# Patient Record
Sex: Male | Born: 1965 | Race: White | Hispanic: No | Marital: Single | Smoking: Never smoker
Health system: Southern US, Community
[De-identification: ages and names within clinical notes are randomized; demographics above are authoritative.]

## PROBLEM LIST (undated history)

## (undated) DIAGNOSIS — G4730 Sleep apnea, unspecified: Secondary | ICD-10-CM

## (undated) DIAGNOSIS — F419 Anxiety disorder, unspecified: Secondary | ICD-10-CM

## (undated) DIAGNOSIS — K219 Gastro-esophageal reflux disease without esophagitis: Secondary | ICD-10-CM

## (undated) DIAGNOSIS — M1990 Unspecified osteoarthritis, unspecified site: Secondary | ICD-10-CM

## (undated) DIAGNOSIS — C801 Malignant (primary) neoplasm, unspecified: Secondary | ICD-10-CM

## (undated) DIAGNOSIS — E119 Type 2 diabetes mellitus without complications: Secondary | ICD-10-CM

## (undated) DIAGNOSIS — R000 Tachycardia, unspecified: Secondary | ICD-10-CM

## (undated) HISTORY — DX: Anxiety disorder, unspecified: F41.9

## (undated) HISTORY — PX: HERNIA REPAIR: SHX51

## (undated) HISTORY — PX: APPENDECTOMY: SHX54

## (undated) HISTORY — PX: TONSILLECTOMY: SUR1361

---

## 2007-12-14 ENCOUNTER — Ambulatory Visit: Payer: Self-pay | Admitting: Vascular Surgery

## 2009-10-15 ENCOUNTER — Inpatient Hospital Stay: Payer: Self-pay | Admitting: Internal Medicine

## 2010-06-29 ENCOUNTER — Emergency Department: Payer: Self-pay | Admitting: Unknown Physician Specialty

## 2010-07-12 ENCOUNTER — Emergency Department: Payer: Self-pay | Admitting: Unknown Physician Specialty

## 2012-08-29 ENCOUNTER — Emergency Department: Payer: Self-pay | Admitting: Emergency Medicine

## 2015-09-21 DIAGNOSIS — F419 Anxiety disorder, unspecified: Secondary | ICD-10-CM | POA: Insufficient documentation

## 2015-09-22 ENCOUNTER — Other Ambulatory Visit: Payer: Self-pay | Admitting: Family Medicine

## 2015-09-22 ENCOUNTER — Ambulatory Visit: Payer: Self-pay | Admitting: Family Medicine

## 2015-09-22 ENCOUNTER — Telehealth: Payer: Self-pay | Admitting: Family Medicine

## 2015-09-22 MED ORDER — CLONAZEPAM 1 MG PO TABS: 1.0000 mg | ORAL_TABLET | Freq: Every day | ORAL | Status: DC | PRN

## 2015-09-22 NOTE — Telephone Encounter (Signed)
Pt called in and is unable to make appt this morning but rescheduled for October 12th and would like to know if he could have clonazepam called in to [facility](he only has 1 pill left)

## 2015-10-07 ENCOUNTER — Ambulatory Visit (INDEPENDENT_AMBULATORY_CARE_PROVIDER_SITE_OTHER): Payer: Self-pay | Admitting: Family Medicine

## 2015-10-07 ENCOUNTER — Encounter: Payer: Self-pay | Admitting: Family Medicine

## 2015-10-07 VITALS — BP 118/85 | HR 105 | Temp 97.7°F | Ht 69.3 in | Wt 228.0 lb

## 2015-10-07 DIAGNOSIS — F419 Anxiety disorder, unspecified: Secondary | ICD-10-CM

## 2015-10-07 MED ORDER — CLONAZEPAM 1 MG PO TABS: 1.0000 mg | ORAL_TABLET | Freq: Every day | ORAL | Status: DC | PRN

## 2015-10-07 NOTE — Progress Notes (Signed)
   BP 118/85 mmHg  Pulse 105  Temp(Src) 97.7 F (36.5 C)  Ht 5' 9.3" (1.76 m)  Wt 228 lb (103.42 kg)  BMI 33.39 kg/m2  SpO2 97%   Subjective:    [name], male    DOB: 04-Aug-1966, 49 y.o.   MRN: [id number]  HPI: Johnny Rios is a 49 y.o. male  Chief Complaint  Patient presents with  . Anxiety    medication refilled   patient doing actually very well has started a new job with [employer]. Celeste stressful with less anxiety taking clonazepam each morning. 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  Relevant past medical, surgical, family and social history reviewed and updated as indicated. Interim medical history since our last visit reviewed. Allergies and medications reviewed and updated.  Review of Systems  Constitutional: Negative.   Respiratory: Negative.   Cardiovascular: Negative.     Per HPI unless specifically indicated above     Objective:    BP 118/85 mmHg  Pulse 105  Temp(Src) 97.7 F (36.5 C)  Ht 5' 9.3" (1.76 m)  Wt 228 lb (103.42 kg)  BMI 33.39 kg/m2  SpO2 97%  Wt Readings from Last 3 Encounters:  10/07/15 228 lb (103.42 kg)  04/01/15 240 lb (108.863 kg)    Physical Exam  Constitutional: He is oriented to person, place, and time. He appears well-developed and well-nourished. No distress.  HENT:  Head: Normocephalic and atraumatic.  Right Ear: Hearing normal.  Left Ear: Hearing normal.  Nose: Nose normal.  Eyes: Conjunctivae and lids are normal. Right eye exhibits no discharge. Left eye exhibits no discharge. No scleral icterus.  Pulmonary/Chest: Effort normal. No respiratory distress.  Musculoskeletal: Normal range of motion.  Neurological: He is alert and oriented to person, place, and time.  Skin:  Skin is intact. No rash noted.  Psychiatric: He has a normal mood and affect. His speech is normal and behavior is normal. Judgment and thought content normal. Cognition and memory are normal.    No results found for this or any previous visit.    Assessment & Plan:   Problem List Items Addressed This Visit      Other   Anxiety - Primary    Patient doing okay especially with job change continuing to need medications will give refills for 6 months.          Follow up plan: Return in about 6 months (around 04/06/2016) for Physical Exam.

## 2015-10-07 NOTE — Assessment & Plan Note (Signed)
Patient doing okay especially with job change continuing to need medications will give refills for 6 months.

## 2015-12-02 ENCOUNTER — Encounter: Payer: Self-pay | Admitting: Unknown Physician Specialty

## 2015-12-02 ENCOUNTER — Ambulatory Visit (INDEPENDENT_AMBULATORY_CARE_PROVIDER_SITE_OTHER): Payer: Self-pay | Admitting: Unknown Physician Specialty

## 2015-12-02 VITALS — BP 123/91 | HR 118 | Temp 98.0°F | Ht 70.1 in | Wt 241.6 lb

## 2015-12-02 DIAGNOSIS — Z9989 Dependence on other enabling machines and devices: Secondary | ICD-10-CM

## 2015-12-02 DIAGNOSIS — E669 Obesity, unspecified: Secondary | ICD-10-CM

## 2015-12-02 DIAGNOSIS — R000 Tachycardia, unspecified: Secondary | ICD-10-CM

## 2015-12-02 DIAGNOSIS — G4733 Obstructive sleep apnea (adult) (pediatric): Secondary | ICD-10-CM | POA: Insufficient documentation

## 2015-12-02 DIAGNOSIS — G4730 Sleep apnea, unspecified: Secondary | ICD-10-CM

## 2015-12-02 LAB — CBC
Hematocrit: 42.4 % (ref 37.5–51.0)
Hemoglobin: 14.9 g/dL (ref 12.6–17.7)
MCH: 28.5 pg (ref 26.6–33.0)
MCHC: 35.1 g/dL (ref 31.5–35.7)
MCV: 81 fL (ref 79–97)
Platelets: 318 10*3/uL (ref 150–379)
RBC: 5.22 x10E6/uL (ref 4.14–5.80)
RDW: 13.2 % (ref 12.3–15.4)
WBC: 9.3 10*3/uL (ref 3.4–10.8)

## 2015-12-02 LAB — BAYER DCA HB A1C WAIVED: HB A1C (BAYER DCA - WAIVED): 5.5 % (ref <7.0)

## 2015-12-02 LAB — GLUCOSE HEMOCUE WAIVED: GLU HEMOCUE WAIVED: 136 mg/dL — AB (ref 65–99)

## 2015-12-02 MED ORDER — METOPROLOL SUCCINATE ER 25 MG PO TB24: 25.0000 mg | ORAL_TABLET | Freq: Every day | ORAL | Status: DC

## 2015-12-02 NOTE — Progress Notes (Signed)
BP 123/91 mmHg  Pulse 118  Temp(Src) 98 F (36.7 C)  Ht 5' 10.1" (1.781 m)  Wt 241 lb 9.6 oz (109.589 kg)  BMI 34.55 kg/m2  SpO2 95%   Subjective:    [name], male    DOB: 1966-05-07, 49 y.o.   MRN: [id number]  HPI: Johnny Rios is a 49 y.o. male  Chief Complaint  Patient presents with  . Tachycardia    pt states his pulse rate and BP has been running high since Monday. Patient also states that he has been dizzy and light headed since Monday.   He is checking his BP which has not been high but pulse is as high as 130.  No additional anxiety or stress though is being treated with clonazepam by Dr. Jeananne Rama.  He is on no BP meds..  No chest pain or SOB.  He is dizzy and light headed that comes and goes.  Drinks occasional alcohol. Reports being thirsty "a lot" and keeps something to drink "all the time."  He does report urinating all the time. Denies palpitations  He describes the dizzyness as being light-headed when getting up or down.     He does admit to snoring loudly at night.  Does not wake up rested.  Sleepy all the time.  Falls aslep if still at all.    Relevant past medical, surgical, family and social history reviewed and updated as indicated. Interim medical history since our last visit reviewed. Allergies and medications reviewed and updated.  Review of Systems  Per HPI unless specifically indicated above     Objective:    BP 123/91 mmHg  Pulse 118  Temp(Src) 98 F (36.7 C)  Ht 5' 10.1" (1.781 m)  Wt 241 lb 9.6 oz (109.589 kg)  BMI 34.55 kg/m2  SpO2 95%  Wt Readings from Last 3 Encounters:  12/02/15 241 lb 9.6 oz (109.589 kg)  10/07/15 228 lb (103.42 kg)  04/01/15 240 lb (108.863 kg)    Physical Exam  Constitutional: He is oriented to person, place, and time. He appears well-developed and well-nourished. No distress.  HENT:  Head: Normocephalic and atraumatic.  Eyes: Conjunctivae and lids are normal. Right eye exhibits no  discharge. Left eye exhibits no discharge. No scleral icterus.  Cardiovascular: Normal rate, regular rhythm, normal heart sounds and intact distal pulses.  Exam reveals no gallop and no friction rub.   No murmur heard. Pulmonary/Chest: Effort normal and breath sounds normal. No respiratory distress.  Abdominal: Soft. Normal appearance and bowel sounds are normal. He exhibits no distension. There is no splenomegaly or hepatomegaly. There is no tenderness. There is no rebound and no guarding.  Musculoskeletal: Normal range of motion.  Neurological: He is alert and oriented to person, place, and time.  Skin: Skin is warm, dry and intact. No rash noted. No pallor.  Psychiatric: He has a normal mood and affect. His behavior is normal. Judgment and thought content normal.  Nursing note and vitals reviewed.  EKG showing sinus tach without acute changes.    Blood sugar is 136 with a Hgb A1C of 5.5 H/H is normal  No results found for this or any previous visit.    Assessment & Plan:   Problem List Items Addressed This Visit      Unprioritized   Obesity   Tachycardia - Primary    Will rx low dose beta blocker.        Relevant Orders   EKG 12-Lead (Completed)  Comprehensive metabolic panel   Glucose Hemocue Waived   CBC   TSH   Bayer DCA Hb A1c Waived   Sleep apnea    Probably contributing to problem.  Will order sleep study      Relevant Orders   Ambulatory referral to Sleep Studies       Follow up plan: Return in about 4 weeks (around 12/30/2015) for With me or Dr. Jeananne Rama.

## 2015-12-02 NOTE — Assessment & Plan Note (Signed)
Will rx low dose beta blocker.

## 2015-12-02 NOTE — Assessment & Plan Note (Signed)
Probably contributing to problem.  Will order sleep study

## 2015-12-03 LAB — COMPREHENSIVE METABOLIC PANEL
ALT: 27 IU/L (ref 0–44)
AST: 19 IU/L (ref 0–40)
Albumin/Globulin Ratio: 1.4 (ref 1.1–2.5)
Albumin: 4 g/dL (ref 3.5–5.5)
Alkaline Phosphatase: 93 IU/L (ref 39–117)
BILIRUBIN TOTAL: 0.3 mg/dL (ref 0.0–1.2)
BUN / CREAT RATIO: 10 (ref 9–20)
BUN: 11 mg/dL (ref 6–24)
CO2: 28 mmol/L (ref 18–29)
CREATININE: 1.05 mg/dL (ref 0.76–1.27)
Calcium: 9.5 mg/dL (ref 8.7–10.2)
Chloride: 99 mmol/L (ref 97–106)
GFR calc Af Amer: 97 mL/min/{1.73_m2} (ref >59)
GFR calc non Af Amer: 84 mL/min/{1.73_m2} (ref >59)
GLOBULIN, TOTAL: 2.8 g/dL (ref 1.5–4.5)
Glucose: 129 mg/dL — ABNORMAL HIGH (ref 65–99)
Potassium: 4.6 mmol/L (ref 3.5–5.2)
Sodium: 139 mmol/L (ref 136–144)
Total Protein: 6.8 g/dL (ref 6.0–8.5)

## 2015-12-03 LAB — TSH: TSH: 1.88 u[IU]/mL (ref 0.450–4.500)

## 2015-12-31 ENCOUNTER — Ambulatory Visit: Payer: Self-pay | Admitting: Family Medicine

## 2016-01-08 ENCOUNTER — Telehealth: Payer: Self-pay | Admitting: Family Medicine

## 2016-01-08 NOTE — Telephone Encounter (Signed)
They are all normal.  He was supposed to be seen for a 4 week f/u

## 2016-01-08 NOTE — Telephone Encounter (Signed)
Pt called stated he would like the results of his most recent labs. Please call pt ASAP. Thanks.

## 2016-01-08 NOTE — Telephone Encounter (Signed)
Called patient to try to schedule follow-up visit but he did not want to schedule.

## 2016-01-08 NOTE — Telephone Encounter (Signed)
Called and spoke to patient. He was seen on 12/02/15 by Malachy Mood and he states he has not heard anything about his labs. Is there anything we need to tell him about his labs?

## 2016-04-13 ENCOUNTER — Encounter: Payer: Self-pay | Admitting: Family Medicine

## 2016-04-13 ENCOUNTER — Ambulatory Visit (INDEPENDENT_AMBULATORY_CARE_PROVIDER_SITE_OTHER): Payer: Self-pay | Admitting: Family Medicine

## 2016-04-13 VITALS — BP 125/86 | HR 93 | Temp 98.0°F | Ht 69.0 in | Wt 250.0 lb

## 2016-04-13 DIAGNOSIS — F419 Anxiety disorder, unspecified: Secondary | ICD-10-CM

## 2016-04-13 DIAGNOSIS — E669 Obesity, unspecified: Secondary | ICD-10-CM

## 2016-04-13 MED ORDER — CLONAZEPAM 1 MG PO TABS
1.0000 mg | ORAL_TABLET | Freq: Every day | ORAL | Status: DC | PRN
Start: 2016-04-13 — End: 2016-10-12

## 2016-04-13 NOTE — Progress Notes (Signed)
BP 125/86 mmHg  Pulse 93  Temp(Src) 98 F (36.7 C)  Ht 5\' 9"  (1.753 m)  Wt 250 lb (113.399 kg)  BMI 36.90 kg/m2  SpO2 96%   Subjective:    [name], male    DOB: 05/15/1966, 50 y.o.   MRN: [id number]  HPI: Johnny Rios is a 50 y.o. male  Chief Complaint  Patient presents with  . Anxiety    med refill  Patient all in all doing well takes clonazepam 5 tablets last month or so aches for anxiety and work stress. Discuss with patient massive weight gain patient got a girlfriend is a good cook  Relevant past medical, surgical, family and social history reviewed and updated as indicated. Interim medical history since our last visit reviewed. Allergies and medications reviewed and updated.  Review of Systems  Constitutional: Negative.   Respiratory: Negative.     Per HPI unless specifically indicated above     Objective:    BP 125/86 mmHg  Pulse 93  Temp(Src) 98 F (36.7 C)  Ht 5\' 9"  (1.753 m)  Wt 250 lb (113.399 kg)  BMI 36.90 kg/m2  SpO2 96%  Wt Readings from Last 3 Encounters:  04/13/16 250 lb (113.399 kg)  12/02/15 241 lb 9.6 oz (109.589 kg)  10/07/15 228 lb (103.42 kg)    Physical Exam  Constitutional: He is oriented to person, place, and time. He appears well-developed and well-nourished. No distress.  HENT:  Head: Normocephalic and atraumatic.  Right Ear: Hearing normal.  Left Ear: Hearing normal.  Nose: Nose normal.  Eyes: Conjunctivae and lids are normal. Right eye exhibits no discharge. Left eye exhibits no discharge. No scleral icterus.  Cardiovascular: Normal rate, regular rhythm and normal heart sounds.   Pulmonary/Chest: Effort normal and breath sounds normal. No respiratory distress.  Musculoskeletal: Normal range of motion.  Neurological: He is alert and oriented to person, place, and time.  Skin: Skin is intact. No rash noted.  Psychiatric: He has a normal mood and affect. His speech is normal and behavior is normal.  Judgment and thought content normal. Cognition and memory are normal.    Results for orders placed or performed in visit on 12/02/15  Comprehensive metabolic panel  Result Value Ref Range   Glucose 129 (H) 65 - 99 mg/dL   BUN 11 6 - 24 mg/dL   Creatinine, Ser 1.05 0.76 - 1.27 mg/dL   GFR calc non Af Amer 84 >59 mL/min/1.73   GFR calc Af Amer 97 >59 mL/min/1.73   BUN/Creatinine Ratio 10 9 - 20   Sodium 139 136 - 144 mmol/L   Potassium 4.6 3.5 - 5.2 mmol/L   Chloride 99 97 - 106 mmol/L   CO2 28 18 - 29 mmol/L   Calcium 9.5 8.7 - 10.2 mg/dL   Total Protein 6.8 6.0 - 8.5 g/dL   Albumin 4.0 3.5 - 5.5 g/dL   Globulin, Total 2.8 1.5 - 4.5 g/dL   Albumin/Globulin Ratio 1.4 1.1 - 2.5   Bilirubin Total 0.3 0.0 - 1.2 mg/dL   Alkaline Phosphatase 93 39 - 117 IU/L   AST 19 0 - 40 IU/L   ALT 27 0 - 44 IU/L  Glucose Hemocue Waived  Result Value Ref Range   Glu Hemocue Waived 136 (H) 65 - 99 mg/dL  CBC  Result Value Ref Range   WBC 9.3 3.4 - 10.8 x10E3/uL   RBC 5.22 4.14 - 5.80 x10E6/uL   Hemoglobin 14.9 12.6 -  17.7 g/dL   Hematocrit 42.4 37.5 - 51.0 %   MCV 81 79 - 97 fL   MCH 28.5 26.6 - 33.0 pg   MCHC 35.1 31.5 - 35.7 g/dL   RDW 13.2 12.3 - 15.4 %   Platelets 318 150 - 379 x10E3/uL  TSH  Result Value Ref Range   TSH 1.880 0.450 - 4.500 uIU/mL  Bayer DCA Hb A1c Waived  Result Value Ref Range   Bayer DCA Hb A1c Waived 5.5 <7.0 %      Assessment & Plan:   Problem List Items Addressed This Visit      Other   Anxiety    Scuffs anxiety use clonazepam will continue current dose      Obesity - Primary    Discuss weight gain and weight loss exercise diet nutrition        Patient now has insurance will schedule physical later this spring or summer  Follow up plan: Return in about 2 months (around 06/13/2016) for Physical Exam.

## 2016-04-13 NOTE — Assessment & Plan Note (Signed)
Discuss weight gain and weight loss exercise diet nutrition

## 2016-04-13 NOTE — Assessment & Plan Note (Signed)
Scuffs anxiety use clonazepam will continue current dose

## 2016-05-25 ENCOUNTER — Encounter: Payer: Self-pay | Admitting: Family Medicine

## 2016-05-25 ENCOUNTER — Ambulatory Visit (INDEPENDENT_AMBULATORY_CARE_PROVIDER_SITE_OTHER): Payer: No Typology Code available for payment source | Admitting: Family Medicine

## 2016-05-25 VITALS — BP 141/91 | HR 119 | Temp 97.8°F | Ht 69.5 in | Wt 249.0 lb

## 2016-05-25 DIAGNOSIS — Z0000 Encounter for general adult medical examination without abnormal findings: Secondary | ICD-10-CM

## 2016-05-25 DIAGNOSIS — F419 Anxiety disorder, unspecified: Secondary | ICD-10-CM | POA: Diagnosis not present

## 2016-05-25 DIAGNOSIS — R1084 Generalized abdominal pain: Secondary | ICD-10-CM

## 2016-05-25 LAB — URINALYSIS, ROUTINE W REFLEX MICROSCOPIC
Bilirubin, UA: NEGATIVE
GLUCOSE, UA: NEGATIVE
Ketones, UA: NEGATIVE
Leukocytes, UA: NEGATIVE
Nitrite, UA: NEGATIVE
Protein, UA: NEGATIVE
RBC, UA: NEGATIVE
Specific Gravity, UA: 1.03 (ref 1.005–1.030)
UUROB: 0.2 mg/dL (ref 0.2–1.0)
pH, UA: 5 (ref 5.0–7.5)

## 2016-05-25 NOTE — Assessment & Plan Note (Signed)
Patient recheck anxiety doing well clonazepam making 45 last month. Discussed limited medications and refills will recheck for refills in 6 months.

## 2016-05-25 NOTE — Progress Notes (Signed)
BP 141/91 mmHg  Pulse 119  Temp(Src) 97.8 F (36.6 C)  Ht 5' 9.5" (1.765 m)  Wt 249 lb (112.946 kg)  BMI 36.26 kg/m2  SpO2 98%   Subjective:    [name], male    DOB: 13-Jun-1966, 50 y.o.   MRN: [id number]  HPI: Johnny Rios is a 50 y.o. male  Chief Complaint  Patient presents with  . Annual Exam  . Gastroesophageal Reflux  Patient with some nonspecific abdominal pain and bloating just a sensation of heaviness sometimes is been ongoing for months now. Takes Nexium over-the-counter which helps some but still bothered by a lot of bloating and gas also has had a pound weight gain. No blood in stool or urine.  Discussed clonazepam has occasional anxiety sometimes takes 2 a day sometimes less than one a day 45 tablets lasts a month or more.  Relevant past medical, surgical, family and social history reviewed and updated as indicated. Interim medical history since our last visit reviewed. Allergies and medications reviewed and updated.  Review of Systems  Constitutional: Negative.   HENT: Negative.   Eyes: Negative.   Respiratory: Negative.   Cardiovascular: Negative.   Gastrointestinal: Negative.   Endocrine: Negative.   Genitourinary: Negative.   Musculoskeletal: Negative.   Skin: Negative.   Allergic/Immunologic: Negative.   Neurological: Negative.   Hematological: Negative.   Psychiatric/Behavioral: Negative.     Per HPI unless specifically indicated above     Objective:    BP 141/91 mmHg  Pulse 119  Temp(Src) 97.8 F (36.6 C)  Ht 5' 9.5" (1.765 m)  Wt 249 lb (112.946 kg)  BMI 36.26 kg/m2  SpO2 98%  Wt Readings from Last 3 Encounters:  05/25/16 249 lb (112.946 kg)  04/13/16 250 lb (113.399 kg)  12/02/15 241 lb 9.6 oz (109.589 kg)    Physical Exam  Constitutional: He is oriented to person, place, and time. He appears well-developed and well-nourished.  HENT:  Head: Normocephalic and atraumatic.  Right Ear: External ear normal.   Left Ear: External ear normal.  Eyes: Conjunctivae and EOM are normal. Pupils are equal, round, and reactive to light.  Neck: Normal range of motion. Neck supple.  Cardiovascular: Normal rate, regular rhythm, normal heart sounds and intact distal pulses.   Pulmonary/Chest: Effort normal and breath sounds normal.  Abdominal: Soft. Bowel sounds are normal. There is no splenomegaly or hepatomegaly.  Genitourinary: Rectum normal, prostate normal and penis normal.  Musculoskeletal: Normal range of motion.  Neurological: He is alert and oriented to person, place, and time. He has normal reflexes.  Skin: No rash noted. No erythema.  Psychiatric: He has a normal mood and affect. His behavior is normal. Judgment and thought content normal.    Results for orders placed or performed in visit on 12/02/15  Comprehensive metabolic panel  Result Value Ref Range   Glucose 129 (H) 65 - 99 mg/dL   BUN 11 6 - 24 mg/dL   Creatinine, Ser 1.05 0.76 - 1.27 mg/dL   GFR calc non Af Amer 84 >59 mL/min/1.73   GFR calc Af Amer 97 >59 mL/min/1.73   BUN/Creatinine Ratio 10 9 - 20   Sodium 139 136 - 144 mmol/L   Potassium 4.6 3.5 - 5.2 mmol/L   Chloride 99 97 - 106 mmol/L   CO2 28 18 - 29 mmol/L   Calcium 9.5 8.7 - 10.2 mg/dL   Total Protein 6.8 6.0 - 8.5 g/dL   Albumin 4.0 3.5 -  5.5 g/dL   Globulin, Total 2.8 1.5 - 4.5 g/dL   Albumin/Globulin Ratio 1.4 1.1 - 2.5   Bilirubin Total 0.3 0.0 - 1.2 mg/dL   Alkaline Phosphatase 93 39 - 117 IU/L   AST 19 0 - 40 IU/L   ALT 27 0 - 44 IU/L  Glucose Hemocue Waived  Result Value Ref Range   Glu Hemocue Waived 136 (H) 65 - 99 mg/dL  CBC  Result Value Ref Range   WBC 9.3 3.4 - 10.8 x10E3/uL   RBC 5.22 4.14 - 5.80 x10E6/uL   Hemoglobin 14.9 12.6 - 17.7 g/dL   Hematocrit 42.4 37.5 - 51.0 %   MCV 81 79 - 97 fL   MCH 28.5 26.6 - 33.0 pg   MCHC 35.1 31.5 - 35.7 g/dL   RDW 13.2 12.3 - 15.4 %   Platelets 318 150 - 379 x10E3/uL  TSH  Result Value Ref Range   TSH  1.880 0.450 - 4.500 uIU/mL  Bayer DCA Hb A1c Waived  Result Value Ref Range   Bayer DCA Hb A1c Waived 5.5 <7.0 %      Assessment & Plan:   Problem List Items Addressed This Visit      Other   Anxiety    Patient recheck anxiety doing well clonazepam making 45 last month. Discussed limited medications and refills will recheck for refills in 6 months.       Other Visit Diagnoses    Routine general medical examination at a health care facility    -  Primary    Relevant Orders    CBC with Differential/Platelet    Comprehensive metabolic panel    Lipid Panel w/o Chol/HDL Ratio    PSA    TSH    Urinalysis, Routine w reflex microscopic (not at [facility])    Healthcare maintenance        Relevant Orders    HIV antibody    Generalized abdominal pain        Relevant Orders    Helicobacter pylori abs-IgG+IgA, bld        Follow up plan: Return in about 6 months (around 11/24/2016) for anxiety check meds.

## 2016-05-25 NOTE — Addendum Note (Signed)
Addended by: Rowe Clack H on: 05/25/2016 01:47 PM   Modules accepted: Miquel Dunn

## 2016-05-26 ENCOUNTER — Telehealth: Payer: Self-pay | Admitting: Family Medicine

## 2016-05-26 MED ORDER — CLARITHROMYCIN 500 MG PO TABS: 500.0000 mg | ORAL_TABLET | Freq: Two times a day (BID) | ORAL | Status: DC

## 2016-05-26 MED ORDER — OMEPRAZOLE 40 MG PO CPDR: 40.0000 mg | DELAYED_RELEASE_CAPSULE | Freq: Every day | ORAL | Status: DC

## 2016-05-26 MED ORDER — AMOXICILLIN 500 MG PO TABS: 1000.0000 mg | ORAL_TABLET | Freq: Two times a day (BID) | ORAL | Status: DC

## 2016-05-26 NOTE — Telephone Encounter (Signed)
Phone call Discussed with patient H. pylori test elevated will start treatment for ulcer. Also patient will do better with cholesterol diet

## 2016-05-28 LAB — CBC WITH DIFFERENTIAL/PLATELET
BASOS ABS: 0.1 10*3/uL (ref 0.0–0.2)
BASOS: 1 %
EOS (ABSOLUTE): 0.2 10*3/uL (ref 0.0–0.4)
Eos: 2 %
HEMOGLOBIN: 14 g/dL (ref 12.6–17.7)
Hematocrit: 42.6 % (ref 37.5–51.0)
IMMATURE GRANS (ABS): 0 10*3/uL (ref 0.0–0.1)
Immature Granulocytes: 0 %
LYMPHS ABS: 3.1 10*3/uL (ref 0.7–3.1)
LYMPHS: 35 %
MCH: 26.8 pg (ref 26.6–33.0)
MCHC: 32.9 g/dL (ref 31.5–35.7)
MCV: 82 fL (ref 79–97)
Monocytes Absolute: 0.5 10*3/uL (ref 0.1–0.9)
Monocytes: 6 %
NEUTROS ABS: 5.2 10*3/uL (ref 1.4–7.0)
Neutrophils: 56 %
PLATELETS: 345 10*3/uL (ref 150–379)
RBC: 5.22 x10E6/uL (ref 4.14–5.80)
RDW: 13 % (ref 12.3–15.4)
WBC: 9.1 10*3/uL (ref 3.4–10.8)

## 2016-05-28 LAB — COMPREHENSIVE METABOLIC PANEL
A/G RATIO: 1.4 (ref 1.2–2.2)
ALBUMIN: 4.3 g/dL (ref 3.5–5.5)
ALK PHOS: 105 IU/L (ref 39–117)
ALT: 24 IU/L (ref 0–44)
AST: 17 IU/L (ref 0–40)
BILIRUBIN TOTAL: 0.4 mg/dL (ref 0.0–1.2)
BUN / CREAT RATIO: 16 (ref 9–20)
BUN: 15 mg/dL (ref 6–24)
CHLORIDE: 99 mmol/L (ref 96–106)
CO2: 23 mmol/L (ref 18–29)
Calcium: 9.7 mg/dL (ref 8.7–10.2)
Creatinine, Ser: 0.93 mg/dL (ref 0.76–1.27)
GFR calc non Af Amer: 97 mL/min/{1.73_m2} (ref >59)
GFR, EST AFRICAN AMERICAN: 112 mL/min/{1.73_m2} (ref >59)
Globulin, Total: 3 g/dL (ref 1.5–4.5)
Glucose: 99 mg/dL (ref 65–99)
POTASSIUM: 4.3 mmol/L (ref 3.5–5.2)
SODIUM: 141 mmol/L (ref 134–144)
TOTAL PROTEIN: 7.3 g/dL (ref 6.0–8.5)

## 2016-05-28 LAB — HELICOBACTER PYLORI ABS-IGG+IGA, BLD
H Pylori IgG: >8 U/mL — ABNORMAL HIGH (ref 0.0–0.8)
H. pylori, IgA Abs: 9.9 units — ABNORMAL HIGH (ref 0.0–8.9)

## 2016-05-28 LAB — LIPID PANEL W/O CHOL/HDL RATIO
CHOLESTEROL TOTAL: 236 mg/dL — AB (ref 100–199)
HDL: 32 mg/dL — ABNORMAL LOW (ref >39)
LDL Calculated: 134 mg/dL — ABNORMAL HIGH (ref 0–99)
Triglycerides: 352 mg/dL — ABNORMAL HIGH (ref 0–149)
VLDL Cholesterol Cal: 70 mg/dL — ABNORMAL HIGH (ref 5–40)

## 2016-05-28 LAB — TSH: TSH: 1 u[IU]/mL (ref 0.450–4.500)

## 2016-05-28 LAB — HIV ANTIBODY (ROUTINE TESTING W REFLEX): HIV SCREEN 4TH GENERATION: NONREACTIVE

## 2016-05-28 LAB — PSA: Prostate Specific Ag, Serum: 1.8 ng/mL (ref 0.0–4.0)

## 2016-09-02 ENCOUNTER — Emergency Department
Admission: EM | Admit: 2016-09-02 | Discharge: 2016-09-02 | Disposition: A | Discharge status: Home / Self Care | Payer: Self-pay | Attending: Emergency Medicine | Admitting: Emergency Medicine

## 2016-09-02 DIAGNOSIS — F17220 Nicotine dependence, chewing tobacco, uncomplicated: Secondary | ICD-10-CM | POA: Insufficient documentation

## 2016-09-02 DIAGNOSIS — T63441A Toxic effect of venom of bees, accidental (unintentional), initial encounter: Secondary | ICD-10-CM | POA: Insufficient documentation

## 2016-09-02 MED ORDER — TRIAMCINOLONE ACETONIDE 0.1 % EX CREA: 1.0000 "application " | TOPICAL_CREAM | Freq: Two times a day (BID) | CUTANEOUS | 0 refills | Status: DC

## 2016-09-02 NOTE — ED Triage Notes (Addendum)
Pt stung by yellowjacket to right side of neck "multiple times" at approx 11AM. Pt took 50mg  benadryl at 11:15AM. . States that he was stung by wasps in face at a young age and "almost died"- when asked what he means by that, he states that he was monitored at hospital, denies any epi administration or other treatments. Pt has been stung by bees since and has had no reaction from it. Pt states that he feels tired and mild SOB at this time. No SOB present at this time. Lungs clear. Pt alert and oriented X4, active, cooperative, pt in NAD. RR even and unlabored, color WNL.

## 2016-09-02 NOTE — Discharge Instructions (Signed)
Continue taking Benadryl 2 every 6 hours as needed for itching or swelling from bee sting. Use cortisone cream directly to the area as needed for itching. Follow-up with your primary care doctor if any continued problems.

## 2016-09-02 NOTE — ED Provider Notes (Signed)
[facility] Emergency Department Provider Note  ____________________________________________   First MD Initiated Contact with Patient 09/02/16 1215     (approximate)  I have reviewed the triage vital signs and the nursing notes.   HISTORY  Chief Complaint Insect Bite  HPI Johnny Rios is a 50 y.o. male is here after being stung by a couple of yellow jackets on the right side of his neck approximately 11 AM. Patient states that he took Benadryl 50 mg within 10 minutes after being stung. He at this time he denies any difficulty breathing or swallowing. He states that as a child he was stung by "bees" and his mother said he almost died. Patient states he was not intubated and did not receive any shots to his knowledge at that time. He has not been told that he needs to carry an EpiPen. He comes to the emergency room today to be checked out and also due to some anxiety about being placed on.   Past Medical History:  Diagnosis Date  . Anxiety     Patient Active Problem List   Diagnosis Date Noted  . Obesity 12/02/2015  . Tachycardia 12/02/2015  . Sleep apnea 12/02/2015  . Anxiety     Past Surgical History:  Procedure Laterality Date  . APPENDECTOMY    . TONSILLECTOMY      Prior to Admission medications   Medication Sig Start Date End Date Taking? Authorizing Provider  amoxicillin (AMOXIL) 500 MG tablet Take 2 tablets (1,000 mg total) by mouth 2 (two) times daily. 05/26/16   Guadalupe Maple, MD  clarithromycin (BIAXIN) 500 MG tablet Take 1 tablet (500 mg total) by mouth 2 (two) times daily. 05/26/16   Guadalupe Maple, MD  clonazePAM (KLONOPIN) 1 MG tablet Take 1-2 tablets (1-2 mg total) by mouth daily as needed for anxiety. 04/13/16   Guadalupe Maple, MD  esomeprazole (NEXIUM) 20 MG capsule Take 20 mg by mouth daily at 12 noon.    Historical Provider, MD  omeprazole (PRILOSEC) 40 MG capsule Take 1 capsule (40 mg total) by mouth daily. 05/26/16   Guadalupe Maple, MD  triamcinolone cream (KENALOG) 0.1 % Apply 1 application topically 2 (two) times daily. 09/02/16   Johnn Hai, PA-C    Allergies Review of patient's allergies indicates no known allergies.  Family History  Problem Relation Age of Onset  . Cancer Father     lung    Social History Social History  Substance Use Topics  . Smoking status: Never Smoker  . Smokeless tobacco: Former Systems developer    Types: Chew  . Alcohol use No     Comment: occasional    Review of Systems Constitutional: No fever/chills Eyes: No visual changes. ENT: No sore throat. Cardiovascular: Denies chest pain. Respiratory: Denies shortness of breath. Gastrointestinal: No nausea, no vomiting.   Musculoskeletal: Negative for back pain. Skin: Bee sting right neck. Neurological: Negative for headaches, focal weakness or numbness.  10-point ROS otherwise negative.  ____________________________________________   PHYSICAL EXAM:  VITAL SIGNS: ED Triage Vitals  Enc Vitals Group     BP 09/02/16 1147 127/88     Pulse Rate 09/02/16 1147 (!) 105     Resp 09/02/16 1147 18     Temp 09/02/16 1147 98.1 F (36.7 C)     Temp Source 09/02/16 1147 Oral     SpO2 09/02/16 1147 97 %     Weight 09/02/16 1148 250 lb (113.4 kg)  Height 09/02/16 1148 5\' 10"  (1.778 m)     Head Circumference --      Peak Flow --      Pain Score --      Pain Loc --      Pain Edu? --      Excl. in [place]? --     Constitutional: Alert and oriented. Well appearing and in no acute distress. Eyes: Conjunctivae are normal. PERRL. EOMI. Head: Atraumatic. Nose: No congestion/rhinnorhea. Mouth/Throat: Mucous membranes are moist.  Oropharynx non-erythematous. Neck: No stridor.  Hematological/Lymphatic/Immunilogical: No cervical lymphadenopathy. Cardiovascular: Normal rate, regular rhythm. Grossly normal heart sounds.  Good peripheral circulation. Respiratory: Normal respiratory effort.  No retractions. Lungs CTAB. Gastrointestinal:  Soft and nontender. No distention.  Musculoskeletal: No lower extremity tenderness nor edema.  No joint effusions. Neurologic:  Normal speech and language. No gross focal neurologic deficits are appreciated. No gait instability. Skin:  Skin is warm, dry. Minimal soft tissue swelling right lateral neck with some minimal erythema. Nontender to palpation. Psychiatric: Mood and affect are normal. Speech and behavior are normal.  ____________________________________________   LABS (all labs ordered are listed, but only abnormal results are displayed)  Labs Reviewed - No data to display  PROCEDURES  Procedure(s) performed: None  Procedures  Critical Care performed: No  ____________________________________________   INITIAL IMPRESSION / ASSESSMENT AND PLAN / ED COURSE  Pertinent labs & imaging results that were available during my care of the patient were reviewed by me and considered in my medical decision making (see chart for details).    Clinical Course   Patient did not have any continued problems while in the emergency room. He is to continue taking Benadryl if needed. He was given a prescription for Kenalog cream to apply to the area externally if needed for itching.  ____________________________________________   FINAL CLINICAL IMPRESSION(S) / ED DIAGNOSES  Final diagnoses:  Bee sting, accidental or unintentional, initial encounter      NEW MEDICATIONS STARTED DURING THIS VISIT:  Discharge Medication List as of 09/02/2016 12:57 PM    START taking these medications   Details  triamcinolone cream (KENALOG) 0.1 % Apply 1 application topically 2 (two) times daily., Starting Fri 09/02/2016, Print         Note:  This document was prepared using Dragon voice recognition software and may include unintentional dictation errors.    Johnn Hai, PA-C 09/02/16 1601    Lavonia Drafts, MD 09/03/16 [phone]

## 2016-10-12 ENCOUNTER — Other Ambulatory Visit: Payer: Self-pay | Admitting: Family Medicine

## 2016-10-12 MED ORDER — CLONAZEPAM 1 MG PO TABS: 1.0000 mg | ORAL_TABLET | Freq: Every day | ORAL | 2 refills | Status: DC | PRN

## 2016-10-12 NOTE — Telephone Encounter (Signed)
Routing to provider  

## 2016-10-31 ENCOUNTER — Encounter: Payer: Self-pay | Admitting: Family Medicine

## 2016-10-31 ENCOUNTER — Ambulatory Visit (INDEPENDENT_AMBULATORY_CARE_PROVIDER_SITE_OTHER): Payer: Self-pay | Admitting: Family Medicine

## 2016-10-31 DIAGNOSIS — R1011 Right upper quadrant pain: Secondary | ICD-10-CM

## 2016-10-31 DIAGNOSIS — R109 Unspecified abdominal pain: Secondary | ICD-10-CM | POA: Insufficient documentation

## 2016-10-31 DIAGNOSIS — K279 Peptic ulcer, site unspecified, unspecified as acute or chronic, without hemorrhage or perforation: Secondary | ICD-10-CM

## 2016-10-31 LAB — CBC WITH DIFFERENTIAL/PLATELET
HEMATOCRIT: 44.1 % (ref 37.5–51.0)
HEMOGLOBIN: 13.8 g/dL (ref 12.6–17.7)
Lymphocytes Absolute: 2.9 10*3/uL (ref 0.7–3.1)
Lymphs: 37 %
MCH: 25.6 pg — ABNORMAL LOW (ref 26.6–33.0)
MCHC: 31.3 g/dL — ABNORMAL LOW (ref 31.5–35.7)
MCV: 82 fL (ref 79–97)
MID (Absolute): 1.1 10*3/uL (ref 0.1–1.6)
MID: 14 %
Neutrophils Absolute: 3.9 10*3/uL (ref 1.4–7.0)
Neutrophils: 49 %
Platelets: 290 10*3/uL (ref 150–379)
RBC: 5.39 x10E6/uL (ref 4.14–5.80)
RDW: 13.6 % (ref 12.3–15.4)
WBC: 7.9 10*3/uL (ref 3.4–10.8)

## 2016-10-31 LAB — URINALYSIS, ROUTINE W REFLEX MICROSCOPIC
Bilirubin, UA: NEGATIVE
GLUCOSE, UA: NEGATIVE
KETONES UA: NEGATIVE
LEUKOCYTES UA: NEGATIVE
NITRITE UA: NEGATIVE
PROTEIN UA: NEGATIVE
RBC UA: NEGATIVE
SPEC GRAV UA: 1.02 (ref 1.005–1.030)
Urobilinogen, Ur: 1 mg/dL (ref 0.2–1.0)
pH, UA: 7 (ref 5.0–7.5)

## 2016-10-31 MED ORDER — BISMUTH SUBSALICYLATE 262 MG PO CHEW: 524.0000 mg | CHEWABLE_TABLET | Freq: Three times a day (TID) | ORAL | 0 refills | Status: DC

## 2016-10-31 MED ORDER — OMEPRAZOLE 20 MG PO CPDR: 20.0000 mg | DELAYED_RELEASE_CAPSULE | Freq: Two times a day (BID) | ORAL | 0 refills | Status: DC

## 2016-10-31 MED ORDER — METRONIDAZOLE 500 MG PO TABS: 500.0000 mg | ORAL_TABLET | Freq: Three times a day (TID) | ORAL | 0 refills | Status: DC

## 2016-10-31 MED ORDER — DOXYCYCLINE HYCLATE 100 MG PO TABS: 100.0000 mg | ORAL_TABLET | Freq: Two times a day (BID) | ORAL | 0 refills | Status: DC

## 2016-10-31 NOTE — Assessment & Plan Note (Signed)
Referral for right upper quadrant ultrasound looking for gallstones

## 2016-10-31 NOTE — Assessment & Plan Note (Signed)
Discuss peptic ulcer disease likely not treated with use alternative therapy Discuss if any problems with medications please contact me Discussed side effects of medications

## 2016-10-31 NOTE — Progress Notes (Signed)
BP (!) 127/91 (BP Location: Left Arm, Patient Position: Sitting, Cuff Size: Large)   Pulse (!) 111   Temp 97.8 F (36.6 C)   Wt 262 lb 3.2 oz (118.9 kg)   SpO2 96%   BMI 37.62 kg/m    Subjective:    [name], male    DOB: September 30, 1966, 50 y.o.   MRN: [id number]  HPI: Johnny Rios is a 50 y.o. male  Chief Complaint  Patient presents with  . Referral    Patient would like a referral to [facility], he states that he has abnormal weight gain and feelings in his abdomen and fatigue   Patient with several concerns and is accompanied by his girlfriend. #1 ongoing abdominal pain for which we saw the patient back in the spring with diagnosis of peptic ulcer disease patient was able to take Nexium and Amoxil due to side effects was unable to take Biaxin. Patient noticed no change with the limited medicine he did take. And is continued to have ongoing abdominal heaviness fullness sometimes made better by eating more. His become also less active along with eating more and has resulted in 13 pounds of weight gain. Patient's also noticed occasional black tarry stools Has not had any lately. Patient also with occasional sharp right upper quadrant pain after eating with some radiation around to his back. Patient with no alcohol abuse, Tylenol abuse. Has been taking Advil during this time.  Relevant past medical, surgical, family and social history reviewed and updated as indicated. Interim medical history since our last visit reviewed. Allergies and medications reviewed and updated.  Review of Systems  Constitutional: Negative.   Respiratory: Negative.   Cardiovascular: Negative.     Per HPI unless specifically indicated above     Objective:    BP (!) 127/91 (BP Location: Left Arm, Patient Position: Sitting, Cuff Size: Large)   Pulse (!) 111   Temp 97.8 F (36.6 C)   Wt 262 lb 3.2 oz (118.9 kg)   SpO2 96%   BMI 37.62 kg/m   Wt Readings from Last 3  Encounters:  10/31/16 262 lb 3.2 oz (118.9 kg)  09/02/16 250 lb (113.4 kg)  05/25/16 249 lb (112.9 kg)    Physical Exam  Constitutional: He is oriented to person, place, and time. He appears well-developed and well-nourished. No distress.  HENT:  Head: Normocephalic and atraumatic.  Right Ear: Hearing normal.  Left Ear: Hearing normal.  Nose: Nose normal.  Eyes: Conjunctivae and lids are normal. Right eye exhibits no discharge. Left eye exhibits no discharge. No scleral icterus.  Cardiovascular: Normal rate, regular rhythm and normal heart sounds.   Pulmonary/Chest: Effort normal and breath sounds normal. No respiratory distress.  Abdominal: Soft. Bowel sounds are normal. He exhibits no distension and no mass. There is no tenderness. There is no rebound and no guarding.  Musculoskeletal: Normal range of motion.  Neurological: He is alert and oriented to person, place, and time.  Skin: Skin is intact. No rash noted.  Psychiatric: He has a normal mood and affect. His speech is normal and behavior is normal. Judgment and thought content normal. Cognition and memory are normal.    Results for orders placed or performed in visit on 05/25/16  CBC with Differential/Platelet  Result Value Ref Range   WBC 9.1 3.4 - 10.8 x10E3/uL   RBC 5.22 4.14 - 5.80 x10E6/uL   Hemoglobin 14.0 12.6 - 17.7 g/dL   Hematocrit 42.6 37.5 -  51.0 %   MCV 82 79 - 97 fL   MCH 26.8 26.6 - 33.0 pg   MCHC 32.9 31.5 - 35.7 g/dL   RDW 13.0 12.3 - 15.4 %   Platelets 345 150 - 379 x10E3/uL   Neutrophils 56 %   Lymphs 35 %   Monocytes 6 %   Eos 2 %   Basos 1 %   Neutrophils Absolute 5.2 1.4 - 7.0 x10E3/uL   Lymphocytes Absolute 3.1 0.7 - 3.1 x10E3/uL   Monocytes Absolute 0.5 0.1 - 0.9 x10E3/uL   EOS (ABSOLUTE) 0.2 0.0 - 0.4 x10E3/uL   Basophils Absolute 0.1 0.0 - 0.2 x10E3/uL   Immature Granulocytes 0 %   Immature Grans (Abs) 0.0 0.0 - 0.1 x10E3/uL  Comprehensive metabolic panel  Result Value Ref Range    Glucose 99 65 - 99 mg/dL   BUN 15 6 - 24 mg/dL   Creatinine, Ser 0.93 0.76 - 1.27 mg/dL   GFR calc non Af Amer 97 >59 mL/min/1.73   GFR calc Af Amer 112 >59 mL/min/1.73   BUN/Creatinine Ratio 16 9 - 20   Sodium 141 134 - 144 mmol/L   Potassium 4.3 3.5 - 5.2 mmol/L   Chloride 99 96 - 106 mmol/L   CO2 23 18 - 29 mmol/L   Calcium 9.7 8.7 - 10.2 mg/dL   Total Protein 7.3 6.0 - 8.5 g/dL   Albumin 4.3 3.5 - 5.5 g/dL   Globulin, Total 3.0 1.5 - 4.5 g/dL   Albumin/Globulin Ratio 1.4 1.2 - 2.2   Bilirubin Total 0.4 0.0 - 1.2 mg/dL   Alkaline Phosphatase 105 39 - 117 IU/L   AST 17 0 - 40 IU/L   ALT 24 0 - 44 IU/L  Lipid Panel w/o Chol/HDL Ratio  Result Value Ref Range   Cholesterol, Total 236 (H) 100 - 199 mg/dL   Triglycerides 352 (H) 0 - 149 mg/dL   HDL 32 (L) >39 mg/dL   VLDL Cholesterol Cal 70 (H) 5 - 40 mg/dL   LDL Calculated 134 (H) 0 - 99 mg/dL  PSA  Result Value Ref Range   Prostate Specific Ag, Serum 1.8 0.0 - 4.0 ng/mL  TSH  Result Value Ref Range   TSH 1.000 0.450 - 4.500 uIU/mL  Urinalysis, Routine w reflex microscopic (not at [facility])  Result Value Ref Range   Specific Gravity, UA 1.030 1.005 - 1.030   pH, UA 5.0 5.0 - 7.5   Color, UA Yellow Yellow   Appearance Ur Clear Clear   Leukocytes, UA Negative Negative   Protein, UA Negative Negative/Trace   Glucose, UA Negative Negative   Ketones, UA Negative Negative   RBC, UA Negative Negative   Bilirubin, UA Negative Negative   Urobilinogen, Ur 0.2 0.2 - 1.0 mg/dL   Nitrite, UA Negative Negative  HIV antibody  Result Value Ref Range   HIV Screen 4th Generation wRfx Non Reactive Non Reactive  Helicobacter pylori abs-IgG+IgA, bld  Result Value Ref Range   H. pylori, IgA Abs 9.9 (H) 0.0 - 8.9 units   H Pylori IgG >8.0 (H) 0.0 - 0.8 U/mL      Assessment & Plan:   Problem List Items Addressed This Visit      Digestive   PUD (peptic ulcer disease)    Discuss peptic ulcer disease likely not treated with use  alternative therapy Discuss if any problems with medications please contact me Discussed side effects of medications       Relevant Orders  Comprehensive metabolic panel   Urinalysis, Routine w reflex microscopic (not at [facility])   CBC With Differential/Platelet   US Abdomen Limited RUQ     Other   Abdominal pain    Referral for right upper quadrant ultrasound looking for gallstones      Relevant Orders   Comprehensive metabolic panel   Urinalysis, Routine w reflex microscopic (not at [facility])   CBC With Differential/Platelet   US Abdomen Limited RUQ       Follow up plan: Return if symptoms worsen or fail to improve, for As scheduled.

## 2016-11-01 ENCOUNTER — Encounter: Payer: Self-pay | Admitting: Family Medicine

## 2016-11-01 LAB — COMPREHENSIVE METABOLIC PANEL
ALK PHOS: 117 IU/L (ref 39–117)
ALT: 45 IU/L — AB (ref 0–44)
AST: 25 IU/L (ref 0–40)
Albumin/Globulin Ratio: 1.4 (ref 1.2–2.2)
Albumin: 4.2 g/dL (ref 3.5–5.5)
BILIRUBIN TOTAL: 0.3 mg/dL (ref 0.0–1.2)
BUN/Creatinine Ratio: 13 (ref 9–20)
BUN: 12 mg/dL (ref 6–24)
CHLORIDE: 101 mmol/L (ref 96–106)
CO2: 27 mmol/L (ref 18–29)
CREATININE: 0.93 mg/dL (ref 0.76–1.27)
Calcium: 9.5 mg/dL (ref 8.7–10.2)
GFR calc non Af Amer: 97 mL/min/{1.73_m2} (ref >59)
GFR, EST AFRICAN AMERICAN: 112 mL/min/{1.73_m2} (ref >59)
GLUCOSE: 96 mg/dL (ref 65–99)
Globulin, Total: 3.1 g/dL (ref 1.5–4.5)
Potassium: 4.6 mmol/L (ref 3.5–5.2)
Sodium: 142 mmol/L (ref 134–144)
TOTAL PROTEIN: 7.3 g/dL (ref 6.0–8.5)

## 2016-11-08 ENCOUNTER — Telehealth: Payer: Self-pay | Admitting: Family Medicine

## 2016-11-08 ENCOUNTER — Ambulatory Visit
Admission: RE | Admit: 2016-11-08 | Discharge: 2016-11-08 | Disposition: A | Discharge status: Home / Self Care | Payer: Self-pay | Source: Ambulatory Visit | Attending: Family Medicine | Admitting: Family Medicine

## 2016-11-08 DIAGNOSIS — K8020 Calculus of gallbladder without cholecystitis without obstruction: Secondary | ICD-10-CM | POA: Insufficient documentation

## 2016-11-08 DIAGNOSIS — K760 Fatty (change of) liver, not elsewhere classified: Secondary | ICD-10-CM | POA: Insufficient documentation

## 2016-11-08 DIAGNOSIS — K8010 Calculus of gallbladder with chronic cholecystitis without obstruction: Secondary | ICD-10-CM

## 2016-11-08 DIAGNOSIS — K279 Peptic ulcer, site unspecified, unspecified as acute or chronic, without hemorrhage or perforation: Secondary | ICD-10-CM | POA: Insufficient documentation

## 2016-11-08 DIAGNOSIS — R1011 Right upper quadrant pain: Secondary | ICD-10-CM | POA: Insufficient documentation

## 2016-11-08 NOTE — Telephone Encounter (Signed)
Routing to provider  

## 2016-11-08 NOTE — Telephone Encounter (Signed)
Phone call Discussed with patient gallstones on ultrasound, which may be causing patient's abdominal pain. will refer to surgery to further evaluate.

## 2016-11-08 NOTE — Telephone Encounter (Signed)
Pt would like to get a referral to see a gastrologist regarding his weight gain

## 2016-11-08 NOTE — Telephone Encounter (Signed)
After he sees a Psychologist, sport and exercise this will be considered.

## 2016-11-15 NOTE — Telephone Encounter (Addendum)
Left detailed message on patient's identified voicemail.

## 2016-11-21 ENCOUNTER — Ambulatory Visit: Payer: Self-pay | Admitting: General Surgery

## 2016-11-24 ENCOUNTER — Ambulatory Visit: Payer: No Typology Code available for payment source | Admitting: Family Medicine

## 2016-12-12 ENCOUNTER — Ambulatory Visit: Payer: Self-pay | Admitting: General Surgery

## 2016-12-28 ENCOUNTER — Encounter: Payer: Self-pay | Admitting: Family Medicine

## 2016-12-28 ENCOUNTER — Ambulatory Visit (INDEPENDENT_AMBULATORY_CARE_PROVIDER_SITE_OTHER): Payer: BLUE CROSS/BLUE SHIELD | Admitting: Family Medicine

## 2016-12-28 VITALS — BP 138/93 | HR 108 | Temp 97.6°F | Wt 263.0 lb

## 2016-12-28 DIAGNOSIS — R21 Rash and other nonspecific skin eruption: Secondary | ICD-10-CM | POA: Diagnosis not present

## 2016-12-28 MED ORDER — PERMETHRIN 5 % EX CREA: 1.0000 "application " | TOPICAL_CREAM | Freq: Once | CUTANEOUS | 0 refills | Status: AC

## 2016-12-28 MED ORDER — PREDNISONE 20 MG PO TABS: 40.0000 mg | ORAL_TABLET | Freq: Every day | ORAL | 0 refills | Status: DC

## 2016-12-28 NOTE — Progress Notes (Signed)
   BP (!) 138/93   Pulse (!) 108   Temp 97.6 F (36.4 C)   Wt 263 lb (119.3 kg)   SpO2 96%   BMI 37.74 kg/m    Subjective:    [name], male    DOB: August 28, 1966, 51 y.o.   MRN: [id number]  HPI: Johnny Rios is a 51 y.o. male  Chief Complaint  Patient presents with  . Rash    x 1 month. All over chest, back, shoulders, legs. Tried OTC creams. Itches. Son was recently treated for scabies. No changes in soaps or detergants.   Patient presents with diffuse, severely pruritic rash that has been present for about a month now. Son was diagnosed right before onset with scabies, treated with good relief with permethrin cream. Pt used it once and had some improvement, but a few days later symptoms returned and have been steady and severe since. Itching worse at night. Has tried many OTC remedies such as bleach, tumeric and lemon, peroxide, and tea tree oil with no relief. Benadryl isn't helping with itch. No new foods, detergents, or medicines.   Relevant past medical, surgical, family and social history reviewed and updated as indicated. Interim medical history since our last visit reviewed. Allergies and medications reviewed and updated.  Review of Systems  Constitutional: Negative.   HENT: Negative.   Eyes: Negative.   Respiratory: Negative.   Cardiovascular: Negative.   Gastrointestinal: Negative.   Genitourinary: Negative.   Musculoskeletal: Negative.   Neurological: Negative.   Psychiatric/Behavioral: Negative.     Per HPI unless specifically indicated above     Objective:    BP (!) 138/93   Pulse (!) 108   Temp 97.6 F (36.4 C)   Wt 263 lb (119.3 kg)   SpO2 96%   BMI 37.74 kg/m   Wt Readings from Last 3 Encounters:  12/28/16 263 lb (119.3 kg)  10/31/16 262 lb 3.2 oz (118.9 kg)  09/02/16 250 lb (113.4 kg)    Physical Exam  Constitutional: He is oriented to person, place, and time. He appears well-developed and well-nourished.  HENT:  Head:  Atraumatic.  Eyes: Conjunctivae are normal. Pupils are equal, round, and reactive to light.  Neck: Normal range of motion. Neck supple.  Cardiovascular: Normal rate.   Pulmonary/Chest: Effort normal. No respiratory distress.  Musculoskeletal: Normal range of motion.  Neurological: He is alert and oriented to person, place, and time.  Skin: Skin is warm and dry. Rash (Diffuse erythematous maculopapular rash across abdomen, arms, legs, etc) noted.  Psychiatric: He has a normal mood and affect. His behavior is normal.  Nursing note and vitals reviewed.     Assessment & Plan:   Problem List Items Addressed This Visit    None    Visit Diagnoses    Rash    -  Primary   Will treat assumptively with permethrin ceam and prednisone. Follow up if no improvement over the next week or so. Treat home periodically.        Follow up plan: Return if symptoms worsen or fail to improve.

## 2016-12-28 NOTE — Patient Instructions (Signed)
Follow up as needed

## 2017-01-04 ENCOUNTER — Encounter: Payer: Self-pay | Admitting: General Surgery

## 2017-01-04 ENCOUNTER — Ambulatory Visit (INDEPENDENT_AMBULATORY_CARE_PROVIDER_SITE_OTHER): Payer: BLUE CROSS/BLUE SHIELD | Admitting: General Surgery

## 2017-01-04 VITALS — BP 130/74 | HR 84 | Resp 12 | Ht 69.0 in | Wt 263.0 lb

## 2017-01-04 DIAGNOSIS — K8020 Calculus of gallbladder without cholecystitis without obstruction: Secondary | ICD-10-CM

## 2017-01-04 NOTE — Progress Notes (Signed)
[name], male   DOB: 1966/05/15, 51 y.o.   MRN: [id number]  Chief Complaint  Patient presents with  . Abdominal Pain    HPI Johnny Rios is a 51 y.o. male here today for a evaluation of abdominal pain. He states the pain started over a year ago. The pain is in his upper right quadrant and last 30 minutes and radiations to his back. He states he had an ultrasound done on 11/08/2016.  Patient moves his bowel every time he eat. Girlfriend [name] is at visit.  HPI  Past Medical History:  Diagnosis Date  . Anxiety     Past Surgical History:  Procedure Laterality Date  . APPENDECTOMY    . TONSILLECTOMY      Family History  Problem Relation Age of Onset  . Cancer Father     lung    Social History Social History  Substance Use Topics  . Smoking status: Never Smoker  . Smokeless tobacco: Former Systems developer    Types: Chew  . Alcohol use No     Comment: occasional    No Known Allergies  Current Outpatient Prescriptions  Medication Sig Dispense Refill  . clonazePAM (KLONOPIN) 1 MG tablet Take 1-2 tablets (1-2 mg total) by mouth daily as needed for anxiety. 45 tablet 2  . esomeprazole (NEXIUM) 20 MG capsule Take 20 mg by mouth daily at 12 noon.    . predniSONE (DELTASONE) 20 MG tablet Take 2 tablets (40 mg total) by mouth daily with breakfast. 20 tablet 0   No current facility-administered medications for this visit.     Review of Systems Review of Systems  Constitutional: Negative.   Respiratory: Negative.   Cardiovascular: Negative.     Blood pressure 130/74, pulse 84, resp. rate 12, height 5\' 9"  (1.753 m), weight 263 lb (119.3 kg).  Physical Exam Physical Exam  Constitutional: He is oriented to person, place, and time. He appears well-developed and well-nourished.  Eyes: Conjunctivae are normal. No scleral icterus.  Neck: Neck supple.  Cardiovascular: Normal rate, regular rhythm and normal heart sounds.   Pulmonary/Chest: Effort normal and breath  sounds normal.  Abdominal: Soft. Bowel sounds are normal. There is no tenderness.  Lymphadenopathy:    He has no cervical adenopathy.  Neurological: He is alert and oriented to person, place, and time.  Skin: Skin is warm and dry.    Data Reviewed H pylori titer positive May 2017. Partial treatment with PPI and amoxicillin, Biaxin not tolerated.  CBC and comprehensive metabolic panel completed November 2017 showed a hemoglobin of 13.8 with an MCV of 82, white blood cell count 7900 with normal differential. Comprehensive metabolic panel notable only for a scant elevation of the SGPT at 45, one point above normal.  Ultrasound dated 11/08/2016. (Reviewed). IMPRESSION: Cholelithiasis, without evidence of acute cholecystitis or biliary ductal dilatation. CBD: 5 mm. Diffuse hepatic steatosis.  Assessment    Biliary colic, past history H. pylori infection.    Plan        Laparoscopic Cholecystectomy with Intraoperative Cholangiogram. The procedure, including it's potential risks and complications (including but not limited to infection, bleeding, injury to intra-abdominal organs or bile ducts, bile leak, poor cosmetic result, sepsis and death) were discussed with the patient in detail. Non-operative options, including their inherent risks (acute calculous cholecystitis with possible choledocholithiasis or gallstone pancreatitis, with the risk of ascending cholangitis, sepsis, and death) were discussed as well. The patient expressed and understanding of what we discussed and wishes  to proceed with laparoscopic cholecystectomy. The patient further understands that if it is technically not possible, or it is unsafe to proceed laparoscopically, that I will convert to an open cholecystectomy.  Patient's surgery has been scheduled for 01-19-17 at [facility].   This information has been scribed by [name] CMA.   Robert Bellow 01/04/2017, 8:46 PM

## 2017-01-04 NOTE — Patient Instructions (Signed)
Laparoscopic Cholecystectomy Laparoscopic cholecystectomy is surgery to remove the gallbladder. The gallbladder is a pear-shaped organ that lies beneath the liver on the right side of the body. The gallbladder stores bile, which is a fluid that helps the body to digest fats. Cholecystectomy is often done for inflammation of the gallbladder (cholecystitis). This condition is usually caused by a buildup of gallstones (cholelithiasis) in the gallbladder. Gallstones can block the flow of bile, which can result in inflammation and pain. In severe cases, emergency surgery may be required. This procedure is done though small incisions in your abdomen (laparoscopic surgery). A thin scope with a camera (laparoscope) is inserted through one incision. Thin surgical instruments are inserted through the other incisions. In some cases, a laparoscopic procedure may be turned into a type of surgery that is done through a larger incision (open surgery). Tell a health care provider about:  Any allergies you have.  All medicines you are taking, including vitamins, herbs, eye drops, creams, and over-the-counter medicines.  Any problems you or family members have had with anesthetic medicines.  Any blood disorders you have.  Any surgeries you have had.  Any medical conditions you have.  Whether you are pregnant or may be pregnant. What are the risks? Generally, this is a safe procedure. However, problems may occur, including:  Infection.  Bleeding.  Allergic reactions to medicines.  Damage to other structures or organs.  A stone remaining in the common bile duct. The common bile duct carries bile from the gallbladder into the small intestine.  A bile leak from the cyst duct that is clipped when your gallbladder is removed. What happens before the procedure? Staying hydrated  Follow instructions from your health care provider about hydration, which may include:  Up to 2 hours before the procedure - you  may continue to drink clear liquids, such as water, clear fruit juice, black coffee, and plain tea. Eating and drinking restrictions  Follow instructions from your health care provider about eating and drinking, which may include:  8 hours before the procedure - stop eating heavy meals or foods such as meat, fried foods, or fatty foods.  6 hours before the procedure - stop eating light meals or foods, such as toast or cereal.  6 hours before the procedure - stop drinking milk or drinks that contain milk.  2 hours before the procedure - stop drinking clear liquids. Medicines   Ask your health care provider about:  Changing or stopping your regular medicines. This is especially important if you are taking diabetes medicines or blood thinners.  Taking medicines such as aspirin and ibuprofen. These medicines can thin your blood. Do not take these medicines before your procedure if your health care provider instructs you not to.  You may be given antibiotic medicine to help prevent infection. General instructions   Let your health care provider know if you develop a cold or an infection before surgery.  Plan to have someone take you home from the hospital or clinic.  Ask your health care provider how your surgical site will be marked or identified. What happens during the procedure?  To reduce your risk of infection:  Your health care team will wash or sanitize their hands.  Your skin will be washed with soap.  Hair may be removed from the surgical area.  An IV tube may be inserted into one of your veins.  You will be given one or more of the following:  A medicine to help you relax (  sedative).  A medicine to make you fall asleep (general anesthetic).  A breathing tube will be placed in your mouth.  Your surgeon will make several small cuts (incisions) in your abdomen.  The laparoscope will be inserted through one of the small incisions. The camera on the laparoscope will  send images to a TV screen (monitor) in the operating room. This lets your surgeon see inside your abdomen.  Air-like gas will be pumped into your abdomen. This will expand your abdomen to give the surgeon more room to perform the surgery.  Other tools that are needed for the procedure will be inserted through the other incisions. The gallbladder will be removed through one of the incisions.  Your common bile duct may be examined. If stones are found in the common bile duct, they may be removed.  After your gallbladder has been removed, the incisions will be closed with stitches (sutures), staples, or skin glue.  Your incisions may be covered with a bandage (dressing). The procedure may vary among health care providers and hospitals. What happens after the procedure?  Your blood pressure, heart rate, breathing rate, and blood oxygen level will be monitored until the medicines you were given have worn off.  You will be given medicines as needed to control your pain.  Do not drive for 24 hours if you were given a sedative. This information is not intended to replace advice given to you by your health care provider. Make sure you discuss any questions you have with your health care provider. Document Released: 12/12/2005 Document Revised: 07/03/2016 Document Reviewed: 05/30/2016 Elsevier Interactive Patient Education  2017 [employer]  

## 2017-01-12 ENCOUNTER — Encounter
Admission: RE | Admit: 2017-01-12 | Discharge: 2017-01-12 | Disposition: A | Discharge status: Home / Self Care | Payer: Self-pay | Source: Ambulatory Visit | Attending: General Surgery | Admitting: General Surgery

## 2017-01-12 HISTORY — DX: Sleep apnea, unspecified: G47.30

## 2017-01-12 HISTORY — DX: Unspecified osteoarthritis, unspecified site: M19.90

## 2017-01-12 HISTORY — DX: Gastro-esophageal reflux disease without esophagitis: K21.9

## 2017-01-12 NOTE — Pre-Procedure Instructions (Signed)
Called patient, left message to return call.

## 2017-01-12 NOTE — Patient Instructions (Signed)
  Your procedure is scheduled on: 01-19-17 Report to Same Day Surgery 2nd floor medical mall [facility] Entrance-take elevator on left to 2nd floor.  Check in with surgery information desk.) To find out your arrival time please call [phone] between 1PM - 3PM on 01-18-17  Remember: Instructions that are not followed completely may result in serious medical risk, up to and including death, or upon the discretion of your surgeon and anesthesiologist your surgery may need to be rescheduled.    _x___ 1. Do not eat food or drink liquids after midnight. No gum chewing or hard candies.     __x__ 2. No Alcohol for 24 hours before or after surgery.   __x__3. No Smoking for 24 prior to surgery.   ____  4. Bring all medications with you on the day of surgery if instructed.    __x__ 5. Notify your doctor if there is any change in your medical condition     (cold, fever, infections).     Do not wear jewelry, make-up, hairpins, clips or nail polish.  Do not wear lotions, powders, or perfumes. You may wear deodorant.  Do not shave 48 hours prior to surgery. Men may shave face and neck.  Do not bring valuables to the hospital.    [facility] is not responsible for any belongings or valuables.               Contacts, dentures or bridgework may not be worn into surgery.  Leave your suitcase in the car. After surgery it may be brought to your room.  For patients admitted to the hospital, discharge time is determined by your treatment team.   Patients discharged the day of surgery will not be allowed to drive home.  You will need someone to drive you home and stay with you the night of your procedure.    Please read over the following fact sheets that you were given:   [facility] Preparing for Surgery and or MRSA Information   _x___ Take these medicines the morning of surgery with A SIP OF WATER:    1. KLONOPIN  2. [place]  3. ALSO TAKE A NEXIUM ON Wednesday NIGHT BEFORE  BED  4.  5.  6.  ____Fleets enema or Magnesium Citrate as directed.   ____ Use CHG Soap or sage wipes as directed on instruction sheet   ____ Use inhalers on the day of surgery and bring to hospital day of surgery  ____ Stop metformin 2 days prior to surgery    ____ Take 1/2 of usual insulin dose the night before surgery and none on the morning of  surgery.   ____ Stop Aspirin, Coumadin, Pllavix ,Eliquis, Effient, or Pradaxa  x__ Stop Anti-inflammatories such as Advil, Aleve, Ibuprofen, Motrin, Naproxen,          Naprosyn, Goodies powders or aspirin products NOW-Ok to take Tylenol.   ____ Stop supplements until after surgery.    ____ Bring C-Pap to the hospital.

## 2017-01-13 ENCOUNTER — Telehealth: Payer: Self-pay | Admitting: Family Medicine

## 2017-01-13 ENCOUNTER — Other Ambulatory Visit: Payer: Self-pay

## 2017-01-13 MED ORDER — HYDROXYZINE HCL 25 MG PO TABS: 25.0000 mg | ORAL_TABLET | Freq: Three times a day (TID) | ORAL | 0 refills | Status: DC | PRN

## 2017-01-13 MED ORDER — CLONAZEPAM 1 MG PO TABS: 1.0000 mg | ORAL_TABLET | Freq: Every day | ORAL | 2 refills | Status: DC | PRN

## 2017-01-13 NOTE — Telephone Encounter (Signed)
Patient notified

## 2017-01-13 NOTE — Telephone Encounter (Signed)
Accidentally created new telephone encounter in response to this call message - pt was notified of new medication sent in and agreeable to plan.

## 2017-01-13 NOTE — Telephone Encounter (Signed)
Last OV: 12/28/16 Next OV: None on file.

## 2017-01-13 NOTE — Telephone Encounter (Signed)
Routing to provider for advice.

## 2017-01-13 NOTE — Telephone Encounter (Signed)
Pt called has a question about the pills for the rash he had. Pt stated the rash has gone but he is still itchy. Please call pt ASAP to advise. Thanks.

## 2017-01-13 NOTE — Telephone Encounter (Signed)
Please call pt and let him know that I've sent him in some hydroxyzine for his itching. Don't take any benadryl while taking this medicine as they are similar medicines but the hydroxyzine is much stronger.

## 2017-01-18 MED ORDER — CEFAZOLIN SODIUM-DEXTROSE 2-4 GM/100ML-% IV SOLN
2.0000 g | INTRAVENOUS | Status: AC
  Administered 2017-01-19: 2 g via INTRAVENOUS

## 2017-01-19 ENCOUNTER — Ambulatory Visit
Admission: RE | Admit: 2017-01-19 | Discharge: 2017-01-19 | Disposition: A | Discharge status: Home / Self Care | Payer: BLUE CROSS/BLUE SHIELD | Source: Ambulatory Visit | Attending: General Surgery | Admitting: General Surgery

## 2017-01-19 ENCOUNTER — Encounter
Admission: RE | Disposition: A | Discharge status: Home / Self Care | Payer: Self-pay | Source: Ambulatory Visit | Attending: General Surgery

## 2017-01-19 ENCOUNTER — Ambulatory Visit: Payer: BLUE CROSS/BLUE SHIELD | Admitting: Anesthesiology

## 2017-01-19 ENCOUNTER — Ambulatory Visit (INDEPENDENT_AMBULATORY_CARE_PROVIDER_SITE_OTHER): Payer: BLUE CROSS/BLUE SHIELD | Admitting: General Surgery

## 2017-01-19 ENCOUNTER — Ambulatory Visit: Payer: BLUE CROSS/BLUE SHIELD

## 2017-01-19 VITALS — BP 132/90 | HR 104 | Resp 16 | Ht 70.0 in

## 2017-01-19 DIAGNOSIS — Z6838 Body mass index (BMI) 38.0-38.9, adult: Secondary | ICD-10-CM | POA: Diagnosis not present

## 2017-01-19 DIAGNOSIS — K8020 Calculus of gallbladder without cholecystitis without obstruction: Secondary | ICD-10-CM

## 2017-01-19 DIAGNOSIS — L7682 Other postprocedural complications of skin and subcutaneous tissue: Secondary | ICD-10-CM

## 2017-01-19 DIAGNOSIS — K8010 Calculus of gallbladder with chronic cholecystitis without obstruction: Secondary | ICD-10-CM | POA: Insufficient documentation

## 2017-01-19 DIAGNOSIS — G4730 Sleep apnea, unspecified: Secondary | ICD-10-CM | POA: Diagnosis not present

## 2017-01-19 DIAGNOSIS — F419 Anxiety disorder, unspecified: Secondary | ICD-10-CM | POA: Diagnosis not present

## 2017-01-19 DIAGNOSIS — K8044 Calculus of bile duct with chronic cholecystitis without obstruction: Secondary | ICD-10-CM | POA: Diagnosis not present

## 2017-01-19 DIAGNOSIS — K219 Gastro-esophageal reflux disease without esophagitis: Secondary | ICD-10-CM | POA: Insufficient documentation

## 2017-01-19 HISTORY — PX: CHOLECYSTECTOMY: SHX55

## 2017-01-19 SURGERY — LAPAROSCOPIC CHOLECYSTECTOMY WITH INTRAOPERATIVE CHOLANGIOGRAM
Anesthesia: General | Wound class: Clean Contaminated

## 2017-01-19 MED ORDER — ROCURONIUM BROMIDE 100 MG/10ML IV SOLN
INTRAVENOUS | Status: DC | PRN
  Administered 2017-01-19: 10 mg via INTRAVENOUS
  Administered 2017-01-19: 50 mg via INTRAVENOUS
  Administered 2017-01-19: 10 mg via INTRAVENOUS

## 2017-01-19 MED ORDER — PROPOFOL 10 MG/ML IV BOLUS
INTRAVENOUS | Status: DC | PRN
  Administered 2017-01-19: 200 mg via INTRAVENOUS

## 2017-01-19 MED ORDER — LIDOCAINE HCL (CARDIAC) 20 MG/ML IV SOLN
INTRAVENOUS | Status: DC | PRN
  Administered 2017-01-19: 80 mg via INTRAVENOUS

## 2017-01-19 MED ORDER — FENTANYL CITRATE (PF) 100 MCG/2ML IJ SOLN
INTRAMUSCULAR | Status: AC
  Filled 2017-01-19: qty 2

## 2017-01-19 MED ORDER — HYDROMORPHONE HCL 1 MG/ML IJ SOLN
INTRAMUSCULAR | Status: AC
  Filled 2017-01-19: qty 1

## 2017-01-19 MED ORDER — SUGAMMADEX SODIUM 200 MG/2ML IV SOLN
INTRAVENOUS | Status: DC | PRN
  Administered 2017-01-19: 200 mg via INTRAVENOUS

## 2017-01-19 MED ORDER — LACTATED RINGERS IV SOLN
INTRAVENOUS | Status: DC
  Administered 2017-01-19: 09:00:00 via INTRAVENOUS

## 2017-01-19 MED ORDER — KETOROLAC TROMETHAMINE 30 MG/ML IJ SOLN
INTRAMUSCULAR | Status: AC
  Filled 2017-01-19: qty 1

## 2017-01-19 MED ORDER — HYDROCODONE-ACETAMINOPHEN 5-325 MG PO TABS: 1.0000 | ORAL_TABLET | ORAL | 0 refills | Status: DC | PRN

## 2017-01-19 MED ORDER — ONDANSETRON HCL 4 MG/2ML IJ SOLN
INTRAMUSCULAR | Status: DC | PRN
  Administered 2017-01-19: 4 mg via INTRAVENOUS

## 2017-01-19 MED ORDER — DEXAMETHASONE SODIUM PHOSPHATE 10 MG/ML IJ SOLN
INTRAMUSCULAR | Status: AC
  Filled 2017-01-19: qty 1

## 2017-01-19 MED ORDER — FENTANYL CITRATE (PF) 100 MCG/2ML IJ SOLN
25.0000 ug | INTRAMUSCULAR | Status: AC | PRN
  Administered 2017-01-19 (×6): 25 ug via INTRAVENOUS

## 2017-01-19 MED ORDER — HYDROCODONE-ACETAMINOPHEN 5-325 MG PO TABS
1.0000 | ORAL_TABLET | Freq: Four times a day (QID) | ORAL | Status: DC | PRN
  Administered 2017-01-19: 2 via ORAL

## 2017-01-19 MED ORDER — ONDANSETRON HCL 4 MG/2ML IJ SOLN
INTRAMUSCULAR | Status: AC
  Filled 2017-01-19: qty 4

## 2017-01-19 MED ORDER — PHENYLEPHRINE HCL 10 MG/ML IJ SOLN
INTRAMUSCULAR | Status: DC | PRN
  Administered 2017-01-19 (×3): 100 ug via INTRAVENOUS

## 2017-01-19 MED ORDER — ROCURONIUM BROMIDE 50 MG/5ML IV SOSY
PREFILLED_SYRINGE | INTRAVENOUS | Status: AC
  Filled 2017-01-19: qty 5

## 2017-01-19 MED ORDER — HYDROMORPHONE HCL 1 MG/ML IJ SOLN: 0.2500 mg | INTRAMUSCULAR | Status: DC | PRN

## 2017-01-19 MED ORDER — KETOROLAC TROMETHAMINE 30 MG/ML IJ SOLN
INTRAMUSCULAR | Status: DC | PRN
  Administered 2017-01-19: 30 mg via INTRAVENOUS

## 2017-01-19 MED ORDER — LIDOCAINE HCL (PF) 2 % IJ SOLN
INTRAMUSCULAR | Status: AC
  Filled 2017-01-19: qty 2

## 2017-01-19 MED ORDER — PROPOFOL 10 MG/ML IV BOLUS
INTRAVENOUS | Status: AC
  Filled 2017-01-19: qty 20

## 2017-01-19 MED ORDER — FENTANYL CITRATE (PF) 100 MCG/2ML IJ SOLN
INTRAMUSCULAR | Status: AC
  Administered 2017-01-19: 25 ug via INTRAVENOUS
  Filled 2017-01-19: qty 2

## 2017-01-19 MED ORDER — PHENYLEPHRINE HCL 10 MG/ML IJ SOLN
INTRAMUSCULAR | Status: AC
  Filled 2017-01-19: qty 1

## 2017-01-19 MED ORDER — SODIUM CHLORIDE 0.9 % IJ SOLN
INTRAMUSCULAR | Status: AC
  Filled 2017-01-19: qty 50

## 2017-01-19 MED ORDER — MIDAZOLAM HCL 2 MG/2ML IJ SOLN
INTRAMUSCULAR | Status: DC | PRN
  Administered 2017-01-19: 2 mg via INTRAVENOUS

## 2017-01-19 MED ORDER — SODIUM CHLORIDE 0.9 % IV SOLN
INTRAVENOUS | Status: DC | PRN
  Administered 2017-01-19: 30 mL

## 2017-01-19 MED ORDER — HYDROCODONE-ACETAMINOPHEN 5-325 MG PO TABS
ORAL_TABLET | ORAL | Status: AC
  Filled 2017-01-19: qty 2

## 2017-01-19 MED ORDER — ONDANSETRON HCL 4 MG/2ML IJ SOLN: 4.0000 mg | Freq: Once | INTRAMUSCULAR | Status: DC | PRN

## 2017-01-19 MED ORDER — ACETAMINOPHEN 10 MG/ML IV SOLN
INTRAVENOUS | Status: DC | PRN
  Administered 2017-01-19: 1000 mg via INTRAVENOUS

## 2017-01-19 MED ORDER — ACETAMINOPHEN 10 MG/ML IV SOLN
INTRAVENOUS | Status: AC
  Filled 2017-01-19: qty 100

## 2017-01-19 MED ORDER — MIDAZOLAM HCL 2 MG/2ML IJ SOLN
INTRAMUSCULAR | Status: AC
  Filled 2017-01-19: qty 2

## 2017-01-19 MED ORDER — DEXAMETHASONE SODIUM PHOSPHATE 10 MG/ML IJ SOLN
INTRAMUSCULAR | Status: DC | PRN
  Administered 2017-01-19: 10 mg via INTRAVENOUS

## 2017-01-19 MED ORDER — CEFAZOLIN SODIUM-DEXTROSE 2-4 GM/100ML-% IV SOLN
INTRAVENOUS | Status: AC
  Administered 2017-01-19: 2 g via INTRAVENOUS
  Filled 2017-01-19: qty 100

## 2017-01-19 MED ORDER — FENTANYL CITRATE (PF) 100 MCG/2ML IJ SOLN
INTRAMUSCULAR | Status: DC | PRN
  Administered 2017-01-19: 100 ug via INTRAVENOUS

## 2017-01-19 SURGICAL SUPPLY — 40 items
APPLIER CLIP ROT 10 11.4 M/L (STAPLE) ×2
BLADE SURG 11 STRL SS SAFETY (MISCELLANEOUS) ×2 IMPLANT
CANISTER SUCT 1200ML W/VALVE (MISCELLANEOUS) ×2 IMPLANT
CANNULA DILATOR 10 W/SLV (CANNULA) ×2 IMPLANT
CANNULA DILATOR 5 W/SLV (CANNULA) ×4 IMPLANT
CATH CHOLANG 76X19 KUMAR (CATHETERS) ×2 IMPLANT
CHLORAPREP W/TINT 26ML (MISCELLANEOUS) ×2 IMPLANT
CLIP APPLIE ROT 10 11.4 M/L (STAPLE) ×1 IMPLANT
CONRAY 60ML FOR OR (MISCELLANEOUS) ×2 IMPLANT
DISSECTOR KITTNER STICK (MISCELLANEOUS) IMPLANT
DISSECTORS/KITTNER STICK (MISCELLANEOUS)
DRAPE SHEET LG 3/4 BI-LAMINATE (DRAPES) ×2 IMPLANT
DRESSING TELFA 4X3 1S ST N-ADH (GAUZE/BANDAGES/DRESSINGS) ×2 IMPLANT
DRSG TEGADERM 2-3/8X2-3/4 SM (GAUZE/BANDAGES/DRESSINGS) ×8 IMPLANT
ELECT REM PT RETURN 9FT ADLT (ELECTROSURGICAL) ×2
ELECTRODE REM PT RTRN 9FT ADLT (ELECTROSURGICAL) ×1 IMPLANT
ENDOPOUCH RETRIEVER 10 (MISCELLANEOUS) IMPLANT
GLOVE BIO SURGEON STRL SZ7.5 (GLOVE) ×10 IMPLANT
GLOVE INDICATOR 8.0 STRL GRN (GLOVE) ×8 IMPLANT
GOWN STRL REUS W/ TWL LRG LVL3 (GOWN DISPOSABLE) ×3 IMPLANT
GOWN STRL REUS W/TWL LRG LVL3 (GOWN DISPOSABLE) ×3
GRASPER SUT TROCAR 14GX15 (MISCELLANEOUS) ×2 IMPLANT
IRRIGATION STRYKERFLOW (MISCELLANEOUS) ×1 IMPLANT
IRRIGATOR STRYKERFLOW (MISCELLANEOUS) ×2
IV LACTATED RINGERS 1000ML (IV SOLUTION) ×2 IMPLANT
KIT RM TURNOVER STRD PROC AR (KITS) ×2 IMPLANT
LABEL OR SOLS (LABEL) ×2 IMPLANT
NDL INSUFF ACCESS 14 VERSASTEP (NEEDLE) ×2 IMPLANT
NS IRRIG 500ML POUR BTL (IV SOLUTION) ×2 IMPLANT
PACK LAP CHOLECYSTECTOMY (MISCELLANEOUS) ×2 IMPLANT
SCISSORS METZENBAUM CVD 33 (INSTRUMENTS) ×2 IMPLANT
SEAL FOR SCOPE WARMER C3101 (MISCELLANEOUS) IMPLANT
STRIP CLOSURE SKIN 1/2X4 (GAUZE/BANDAGES/DRESSINGS) ×2 IMPLANT
SUT VIC AB 0 CT2 27 (SUTURE) ×2 IMPLANT
SUT VIC AB 4-0 FS2 27 (SUTURE) ×2 IMPLANT
SWABSTK COMLB BENZOIN TINCTURE (MISCELLANEOUS) ×2 IMPLANT
TOWEL OR 17X26 4PK STRL BLUE (TOWEL DISPOSABLE) ×2 IMPLANT
TROCAR XCEL NON-BLD 11X100MML (ENDOMECHANICALS) ×2 IMPLANT
TUBING INSUFFLATOR HI FLOW (MISCELLANEOUS) ×2 IMPLANT
WATER STERILE IRR 1000ML POUR (IV SOLUTION) IMPLANT

## 2017-01-19 NOTE — Patient Instructions (Signed)
Return as scheduled 

## 2017-01-19 NOTE — Anesthesia Procedure Notes (Signed)
Procedure Name: Intubation Performed by: Letitia Neri Pre-anesthesia Checklist: Patient identified, Patient being monitored, Timeout performed, Emergency Drugs available and Suction available Patient Re-evaluated:Patient Re-evaluated prior to inductionOxygen Delivery Method: Circle system utilized Preoxygenation: Pre-oxygenation with 100% oxygen Intubation Type: IV induction Ventilation: Mask ventilation without difficulty Laryngoscope Size: Mac and 3 Grade View: Grade I Tube type: Oral Tube size: 7.0 mm Number of attempts: 1 Airway Equipment and Method: Stylet Placement Confirmation: ETT inserted through vocal cords under direct vision,  positive ETCO2 and breath sounds checked- equal and bilateral Secured at: 21 cm Tube secured with: Tape Dental Injury: Teeth and Oropharynx as per pre-operative assessment

## 2017-01-19 NOTE — Discharge Instructions (Signed)

## 2017-01-19 NOTE — Anesthesia Postprocedure Evaluation (Signed)
Anesthesia Post Note  Patient: Johnny Rios  Procedure(s) Performed: Procedure(s) (LRB): LAPAROSCOPIC CHOLECYSTECTOMY WITH INTRAOPERATIVE CHOLANGIOGRAM (N/A)  Patient location during evaluation: PACU Anesthesia Type: General Level of consciousness: awake Pain management: pain level controlled Vital Signs Assessment: post-procedure vital signs reviewed and stable Respiratory status: spontaneous breathing Cardiovascular status: stable Anesthetic complications: no     Last Vitals:  Vitals:   01/19/17 1055 01/19/17 1105  BP: (!) 159/87   Pulse: 100 99  Resp: 17 15  Temp: 37.2 C     Last Pain:  Vitals:   01/19/17 1105  TempSrc:   PainSc: 10-Worst pain ever                 VAN STAVEREN,Jolly Bleicher

## 2017-01-19 NOTE — OR Nursing (Signed)
Patient reports "being sick" for the past two weeks. Patient states he still has some drainage with chest congestion and cough.  Notified anesthesia to come assess.

## 2017-01-19 NOTE — Progress Notes (Signed)
Patient ID: BACH DEEMS, male   DOB: 1966/09/25, 51 y.o.   MRN: [id number]  Chief Complaint  Patient presents with  . Other    HPI Johnny Rios is a 51 y.o. male here today for bleeding from umbilical area, having undergone cholecystectomy earlier today.                           Marland KitchenHPI  Past Medical History:  Diagnosis Date  . Anxiety   . Arthritis   . GERD (gastroesophageal reflux disease)   . History of kidney stones   . Sleep apnea    NO CPAP-PT NEVER WENT AND PICKED UP CPAP    Past Surgical History:  Procedure Laterality Date  . APPENDECTOMY    . TONSILLECTOMY      Family History  Problem Relation Age of Onset  . Cancer Father     lung    Social History Social History  Substance Use Topics  . Smoking status: Never Smoker  . Smokeless tobacco: Former Systems developer    Types: Chew  . Alcohol use Yes     Comment: occasional    No Known Allergies  Current Outpatient Prescriptions  Medication Sig Dispense Refill  . clonazePAM (KLONOPIN) 1 MG tablet Take 1-2 tablets (1-2 mg total) by mouth daily as needed for anxiety. 45 tablet 2  . esomeprazole (NEXIUM) 20 MG capsule Take 20 mg by mouth every morning.     Marland Kitchen HYDROcodone-acetaminophen (NORCO) 5-325 MG tablet Take 1-2 tablets by mouth every 4 (four) hours as needed for moderate pain. 30 tablet 0  . hydrOXYzine (ATARAX/VISTARIL) 25 MG tablet Take 1 tablet (25 mg total) by mouth 3 (three) times daily as needed. 30 tablet 0  . ibuprofen (ADVIL,MOTRIN) 200 MG tablet Take 400 mg by mouth every 6 (six) hours as needed.    . predniSONE (DELTASONE) 20 MG tablet Take 2 tablets (40 mg total) by mouth daily with breakfast. 20 tablet 0   No current facility-administered medications for this visit.     Review of Systems Review of Systems  Constitutional: Negative.   Respiratory: Negative.   Cardiovascular: Negative.     Blood pressure 132/90, pulse (!) 104, resp. rate 16, height 5\' 10"  (1.778 m).  Physical Exam Physical  Exam Minimal bleeding of serosanguineous fluid from the umbilical site.      Assessment    Surgical site bleeding.    Plan    Area was cleansed with ChloraPrep and 10 mL of 0.5% Xylocaine with 0.25% Marcaine with 1-200,000 of epinephrine was instilled. ChloraPrep was reapplied and the wound draped. 4-0 Prolene sutures were placed for final hemostasis. Dry dressing with Telfa and Tegaderm applied.  Follow-up as previously scheduled.    This information has been scribed by [name] CMA.    Johnny Rios 01/19/2017, 2:40 PM

## 2017-01-19 NOTE — Transfer of Care (Signed)
Immediate Anesthesia Transfer of Care Note  Patient: Johnny Rios  Procedure(s) Performed: Procedure(s): LAPAROSCOPIC CHOLECYSTECTOMY WITH INTRAOPERATIVE CHOLANGIOGRAM (N/A)  Patient Location: PACU  Anesthesia Type:General  Level of Consciousness: sedated  Airway & Oxygen Therapy: Patient Spontanous Breathing and Patient connected to face mask oxygen  Post-op Assessment: Report given to RN and Post -op Vital signs reviewed and stable  Post vital signs: Reviewed and stable  Last Vitals:  Vitals:   01/19/17 0809  BP: (!) 126/92  Pulse: (!) 116  Resp: 20  Temp: 37.2 C    Last Pain:  Vitals:   01/19/17 0809  TempSrc: Tympanic         Complications: No apparent anesthesia complications

## 2017-01-19 NOTE — H&P (Signed)
No change in clinical history or exam. Mild cough with clear sputum last week that has cleared.  For cholecystectomy.

## 2017-01-19 NOTE — Anesthesia Post-op Follow-up Note (Cosign Needed)
Anesthesia QCDR form completed.        

## 2017-01-19 NOTE — OR Nursing (Signed)
Dr Bary Castilla made aware that patient was treated previoulsy for scabies(n within last three weeks).  OR nurse and anesthesia aware of iercings in ears that were unable to be removed.  Tape applied

## 2017-01-19 NOTE — Op Note (Signed)
Preoperative diagnosis: Chronic cholecystitis and cholelithiasis.  Postoperative diagnosis: Same.  Operative procedure: Laparoscopic cholecystectomy with intraoperative cholangiograms.  Operating surgeon: Charlie Pitter.  Anesthesia: Gen. endotracheal.  Estimate blood loss: 15 mL.  Clinical note: This 51 year old male had episodic abdominal pain and evidence of cholelithiasis. He was admitted for elective cholecystectomy.  Operative note: The patient had hair removed from the surgical site prior to presentation to the operating theater. After induction of general endotracheal anesthesia the abdomen was prepped with ChloraPrep and draped. In Trendelenburg position a varies needle was placed through a trans-umbilical incision. After sharing intra-abdominal location with a hanging drop test the abdomen was insufflated with CO2 a 10 mmHg pressure. A 10 mm Step port was expanded with no evidence of injury from initial port placement. The patient was placed in reverse Trendelenburg position and rolled to the left. An 11 mm XL port was placed in the epigastrium under direct vision and 25 mm Depth ports placed in the right side of the abdomen. The omentum was taken down from the top of the gallbladder. The gallbladder was grasped at the fundus 10 move cephalad. Adhesions between the omentum and the gallbladder were taken down with cautery dissection. After exposure of the cystic duct cholangiograms were occluded using 15 mL of one half strength Conray 60. This showed prompt filling of the right hepatic duct and free flow into the duodenum. The left duct was not well visualized. No evidence of retained stones. The cystic duct was doubly clipped and divided as were branches of the cystic artery. The gallbladder was removed from the liver bed makings of hook cautery dissection. It was delivered to the umbilical port site. It was necessary to expand the incision at this level due to the thickness the gallbladder  wall. After extraction of the gallbladder was completed pneumoperitoneum was reestablished. Right upper quadrant was irrigated with lactated Ringer solution. Inspection showed good hemostasis. The abdomen was then desufflated under direct vision after placement of a 0 Vicryl suture making use of the "cone" and suture passer at the umbilical site. Skin incisions were closed with 4-0 Vicryl septic suture. Benzoin, Steri-Strips, Telfa and Tegaderm dressings were applied.  The patient tolerated the procedure well and was taken to recovery in stable condition.

## 2017-01-19 NOTE — Anesthesia Preprocedure Evaluation (Signed)
Anesthesia Evaluation  Patient identified by MRN, date of birth, ID band Patient awake    Reviewed: Allergy & Precautions, NPO status , Patient's Chart, lab work & pertinent test results  Airway Mallampati: II       Dental  (+) Teeth Intact   Pulmonary sleep apnea ,    breath sounds clear to auscultation       Cardiovascular Exercise Tolerance: Good  Rhythm:Regular Rate:Normal     Neuro/Psych Anxiety    GI/Hepatic Neg liver ROS, PUD, GERD  Medicated,  Endo/Other  Morbid obesity  Renal/GU negative Renal ROS     Musculoskeletal   Abdominal (+) + obese,   Peds  Hematology   Anesthesia Other Findings   Reproductive/Obstetrics                             Anesthesia Physical Anesthesia Plan  ASA: III  Anesthesia Plan: General   Post-op Pain Management:    Induction: Intravenous  Airway Management Planned: Oral ETT  Additional Equipment:   Intra-op Plan:   Post-operative Plan: Extubation in OR  Informed Consent: I have reviewed the patients History and Physical, chart, labs and discussed the procedure including the risks, benefits and alternatives for the proposed anesthesia with the patient or authorized representative who has indicated his/her understanding and acceptance.     Plan Discussed with: CRNA and Surgeon  Anesthesia Plan Comments:         Anesthesia Quick Evaluation

## 2017-01-20 LAB — SURGICAL PATHOLOGY

## 2017-01-21 ENCOUNTER — Telehealth: Payer: Self-pay | Admitting: General Surgery

## 2017-01-21 NOTE — Telephone Encounter (Signed)
Patient's girlfriend called, as he had called her regarding the passage of some blood per rectum with BM today. Contacted patient: small amount of bright red, thin blood on toilet paper and in the water. Pain free. Soft stool. No previous episodes. No narcotics last 24 hours. Eating well.  No further bleeding at umbilical site. Not presently on prednisone used for scabies rx. HX h pylori in May 2017, no post treatment testing. With BRBPR: unlikely UGI source. No intervention at this time.  To call if recurrent episodes, to ED if large volume bleeding.

## 2017-01-26 ENCOUNTER — Ambulatory Visit: Payer: BLUE CROSS/BLUE SHIELD | Admitting: General Surgery

## 2017-01-26 ENCOUNTER — Ambulatory Visit (INDEPENDENT_AMBULATORY_CARE_PROVIDER_SITE_OTHER): Payer: BLUE CROSS/BLUE SHIELD | Admitting: General Surgery

## 2017-01-26 ENCOUNTER — Encounter: Payer: Self-pay | Admitting: General Surgery

## 2017-01-26 VITALS — BP 124/84 | HR 120 | Resp 12 | Ht 70.0 in | Wt 263.0 lb

## 2017-01-26 DIAGNOSIS — K8020 Calculus of gallbladder without cholecystitis without obstruction: Secondary | ICD-10-CM

## 2017-01-26 NOTE — Patient Instructions (Addendum)
Return as needed

## 2017-01-26 NOTE — Progress Notes (Signed)
[name], male   DOB: 11/01/1966, 51 y.o.   MRN: [id number]  Chief Complaint  Patient presents with  . Routine Post Op    HPI Johnny Rios is a 50 y.o. male here today for his post op gallbladder removal done on 01/19/2017. Patient states he is doing well.   No further bleeding from the umbilical site since redressed last week. HPI  Past Medical History:  Diagnosis Date  . Anxiety   . Arthritis   . GERD (gastroesophageal reflux disease)   . History of kidney stones   . Sleep apnea    NO CPAP-PT NEVER WENT AND PICKED UP CPAP    Past Surgical History:  Procedure Laterality Date  . APPENDECTOMY    . CHOLECYSTECTOMY N/A 01/19/2017   Procedure: LAPAROSCOPIC CHOLECYSTECTOMY WITH INTRAOPERATIVE CHOLANGIOGRAM;  Surgeon: Robert Bellow, MD;  Location: [facility] ORS;  Service: General;  Laterality: N/A;  . TONSILLECTOMY      Family History  Problem Relation Age of Onset  . Cancer Father     lung    Social History Social History  Substance Use Topics  . Smoking status: Never Smoker  . Smokeless tobacco: Former Systems developer    Types: Chew  . Alcohol use Yes     Comment: occasional    No Known Allergies  Current Outpatient Prescriptions  Medication Sig Dispense Refill  . clonazePAM (KLONOPIN) 1 MG tablet Take 1-2 tablets (1-2 mg total) by mouth daily as needed for anxiety. 45 tablet 2  . esomeprazole (NEXIUM) 20 MG capsule Take 20 mg by mouth every morning.     Marland Kitchen HYDROcodone-acetaminophen (NORCO) 5-325 MG tablet Take 1-2 tablets by mouth every 4 (four) hours as needed for moderate pain. 30 tablet 0  . hydrOXYzine (ATARAX/VISTARIL) 25 MG tablet Take 1 tablet (25 mg total) by mouth 3 (three) times daily as needed. 30 tablet 0  . ibuprofen (ADVIL,MOTRIN) 200 MG tablet Take 400 mg by mouth every 6 (six) hours as needed.    . predniSONE (DELTASONE) 20 MG tablet Take 2 tablets (40 mg total) by mouth daily with breakfast. 20 tablet 0   No current facility-administered  medications for this visit.     Review of Systems Review of Systems  Constitutional: Negative.   Respiratory: Negative.   Cardiovascular: Negative.     Blood pressure 124/84, pulse (!) 120, resp. rate 12, height 5\' 10"  (1.778 m), weight 263 lb (119.3 kg).  Physical Exam Physical Exam  Constitutional: He is oriented to person, place, and time. He appears well-developed and well-nourished.  Cardiovascular: Normal rate, regular rhythm and normal heart sounds.   Pulmonary/Chest: Effort normal and breath sounds normal.  Abdominal: Soft. Normal appearance and bowel sounds are normal.  Port sites are clean and healing well.   Umbilical sutures removed.  Neurological: He is alert and oriented to person, place, and time.  Skin: Skin is dry.    Data Reviewed DIAGNOSIS:  A. GALLBLADDER; CHOLECYSTECTOMY:  - CHRONIC CHOLECYSTITIS AND CHOLELITHIASIS.  - CHOLESTEROLOSIS.  - NEGATIVE FOR DYSPLASIA AND MALIGNANCY.   Assessment    Doing well status post cholecystectomy.  History strongly suggestive of sleep apnea with respiratory process, daytime somnolence and 20 pound weight gain over the last year.    Plan    The patient reported that he did not complete a sleep study as he had no insurance at the time it was recommended. With his above-mentioned symptoms, he is likely to greatly benefit from management of  his sleep apnea.  The patient has been encouraged to contact his primary care provider for referral backed for a sleep study in 2 obtain a CPAP unit if it is recommended.  Patient to return as needed.    This information has been scribed by [name] CMA.     Robert Bellow 01/27/2017, 10:08 AM

## 2017-01-27 ENCOUNTER — Telehealth: Payer: Self-pay | Admitting: Family Medicine

## 2017-01-27 DIAGNOSIS — R0683 Snoring: Secondary | ICD-10-CM

## 2017-01-27 NOTE — Telephone Encounter (Signed)
Pt called and stated that he now has his insurance straightened out and he would like to get his sleep study done.

## 2017-01-27 NOTE — Telephone Encounter (Signed)
Routing to provider to enter in a referral.

## 2017-01-30 NOTE — Telephone Encounter (Signed)
Please do

## 2017-01-31 ENCOUNTER — Ambulatory Visit: Payer: BLUE CROSS/BLUE SHIELD | Admitting: General Surgery

## 2017-01-31 ENCOUNTER — Telehealth: Payer: Self-pay | Admitting: Family Medicine

## 2017-01-31 NOTE — Telephone Encounter (Signed)
See previous telephone encounter.

## 2017-01-31 NOTE — Telephone Encounter (Signed)
Got it.

## 2017-01-31 NOTE — Telephone Encounter (Signed)
Referral was placed. Being handled by referral coordinator.

## 2017-01-31 NOTE — Telephone Encounter (Signed)
Referral placed.

## 2017-01-31 NOTE — Telephone Encounter (Signed)
Pt girlfriend called and stated that the pt needs a referral for a sleep study.

## 2017-02-07 ENCOUNTER — Telehealth: Payer: Self-pay | Admitting: Family Medicine

## 2017-02-07 NOTE — Telephone Encounter (Signed)
Patient is found out [facility] neurology does sleep studies that would be approved by his insurance see sleep study referral and changed to [facility] neurology.

## 2017-02-07 NOTE — Telephone Encounter (Signed)
Patient is found out [facility] neurology does sleep studies that would be approved by his insurance see sleep study referral and changed to [facility] neurology.

## 2017-02-07 NOTE — Telephone Encounter (Signed)
Dr. Jeananne Rama,  Could you please enter in new referral for patient?

## 2017-02-07 NOTE — Telephone Encounter (Signed)
Call pt 

## 2017-02-08 NOTE — Telephone Encounter (Signed)
Referral directed to [facility] [facility].  They have the referral.

## 2017-03-07 ENCOUNTER — Institutional Professional Consult (permissible substitution): Payer: BLUE CROSS/BLUE SHIELD | Admitting: Neurology

## 2017-03-07 ENCOUNTER — Telehealth: Payer: Self-pay

## 2017-03-07 NOTE — Telephone Encounter (Signed)
Patient did not show to appt today  

## 2017-03-09 ENCOUNTER — Encounter: Payer: Self-pay | Admitting: Neurology

## 2017-03-09 ENCOUNTER — Ambulatory Visit (INDEPENDENT_AMBULATORY_CARE_PROVIDER_SITE_OTHER): Payer: BLUE CROSS/BLUE SHIELD | Admitting: Neurology

## 2017-03-09 VITALS — BP 130/72 | HR 98 | Resp 18 | Ht 70.0 in | Wt 266.0 lb

## 2017-03-09 DIAGNOSIS — G4733 Obstructive sleep apnea (adult) (pediatric): Secondary | ICD-10-CM

## 2017-03-09 DIAGNOSIS — Z820 Family history of epilepsy and other diseases of the nervous system: Secondary | ICD-10-CM | POA: Diagnosis not present

## 2017-03-09 DIAGNOSIS — E669 Obesity, unspecified: Secondary | ICD-10-CM

## 2017-03-09 DIAGNOSIS — R351 Nocturia: Secondary | ICD-10-CM | POA: Diagnosis not present

## 2017-03-09 DIAGNOSIS — R400 Somnolence: Secondary | ICD-10-CM

## 2017-03-09 NOTE — Patient Instructions (Signed)

## 2017-03-09 NOTE — Progress Notes (Signed)
Subjective:    [name] is a 51 y.o. male.  HPI    Star Age, MD, PhD [facility] [address], [address] [place], [place] [address]  Dear Dr. Jeananne Rama,   I saw your patient, Gregor Dershem, upon your kind request in my neurologic clinic today for initial consultation of his sleep disorder, in particular, concern for underlying obstructive sleep apnea. The patient is accompanied by his Jacqulyn Liner, today. Of note, the patient no showed for an appointment on 03/07/17. As you know, Mr. Fero is a 51 year old right-handed gentleman with an underlying medical history of kidney stones, reflux disease, peptic ulcer disease, arthritis, anxiety, status post recent laparoscopic cholecystectomy on 01/19/2017, and obesity, who reports snoring and severe/excessive daytime somnolence. I reviewed your office note from 10/31/2016. His Epworth sleepiness score is 22 out of 24 today, fatigue score is 58 out of 63. His girlfriend reports apneic pauses while he is asleep. He is the youngest of 9 total siblings and one brother has OSA, on CPAP. He has 2 children, 91 year old daughter, who has a nearly 60-year-old child, and he has a 41-year-old son whom he sees on weekends. He feels that he does not have the energy to spend time with him playing or being active as much  as he would like. He denies morning headaches, he has nocturia about once per night on average, denies restless leg symptoms or leg twitching at night. He is a nonsmoker. He drinks alcohol rarely. He drinks caffeine, usually several cups of tea during the day and a cup of coffee at night. He does not have trouble going to sleep or staying asleep. Bedtime and wake time very. His mother lived to be 3 years old. He believes she was tested with a sleep study and had OSA. His father died at 50 with cancer. His mother had dementia. He has had sleep-related symptoms for years. He wakes himself up choking and  gasping for air. His girlfriend reports loud snoring and frequent witnessed apneic pauses while he is asleep. He used to body work. He has worked in [employer].   His Past Medical History Is Significant For: Past Medical History:  Diagnosis Date  . Anxiety   . Arthritis   . GERD (gastroesophageal reflux disease)   . History of kidney stones   . Sleep apnea    NO CPAP-PT NEVER WENT AND PICKED UP CPAP    His Past Surgical History Is Significant For: Past Surgical History:  Procedure Laterality Date  . APPENDECTOMY    . CHOLECYSTECTOMY N/A 01/19/2017   Procedure: LAPAROSCOPIC CHOLECYSTECTOMY WITH INTRAOPERATIVE CHOLANGIOGRAM;  Surgeon: Robert Bellow, MD;  Location: [facility] ORS;  Service: General;  Laterality: N/A;  . TONSILLECTOMY      His Family History Is Significant For: Family History  Problem Relation Age of Onset  . Cancer Father     lung  . Alzheimer's disease Mother     His Social History Is Significant For: Social History   Social History  . Marital status: Significant Other    Spouse name: N/A  . Number of children: 2  . Years of education: HS   Social History Main Topics  . Smoking status: Never Smoker  . Smokeless tobacco: Former Systems developer    Types: Chew  . Alcohol use Yes     Comment: occasional  . Drug use: No  . Sexual activity: Not Asked   Other Topics Concern  . None  Social History Narrative   Drinks 3-4 caffeine drinks a day     His Allergies Are:  No Known Allergies:   His Current Medications Are:  Outpatient Encounter Prescriptions as of 03/09/2017  Medication Sig  . clonazePAM (KLONOPIN) 1 MG tablet Take 1-2 tablets (1-2 mg total) by mouth daily as needed for anxiety.  Marland Kitchen esomeprazole (NEXIUM) 20 MG capsule Take 20 mg by mouth every morning.   . [DISCONTINUED] HYDROcodone-acetaminophen (NORCO) 5-325 MG tablet Take 1-2 tablets by mouth every 4 (four) hours as needed for moderate pain.  . [DISCONTINUED] hydrOXYzine (ATARAX/VISTARIL) 25 MG  tablet Take 1 tablet (25 mg total) by mouth 3 (three) times daily as needed.  . [DISCONTINUED] ibuprofen (ADVIL,MOTRIN) 200 MG tablet Take 400 mg by mouth every 6 (six) hours as needed.  . [DISCONTINUED] predniSONE (DELTASONE) 20 MG tablet Take 2 tablets (40 mg total) by mouth daily with breakfast.   No facility-administered encounter medications on file as of 03/09/2017.   :  Review of Systems:  Out of a complete 14 point review of systems, all are reviewed and negative with the exception of these symptoms as listed below:  Review of Systems  Neurological:       Patient reports that he has not had a sleep study before.   Patient has trouble falling and staying asleep, snores, witnessed apnea, wakes up gasping for air, wakes up feeling tired, daytime fatigue, takes naps. Says when he was working, he would fall asleep standing up.    Epworth Sleepiness Scale 0= would never doze 1= slight chance of dozing 2= moderate chance of dozing 3= high chance of dozing  Sitting and reading:3 Watching TV:3 Sitting inactive in a public place (ex. Theater or meeting):3 As a passenger in a car for an hour without a break:3 Lying down to rest in the afternoon:3 Sitting and talking to someone:2 Sitting quietly after lunch (no alcohol):3 In a car, while stopped in traffic:2 Total:22  Objective:  Neurologic Exam  Physical Exam Physical Examination:   Vitals:   03/09/17 1546  BP: 130/72  Pulse: 98  Resp: 18    General Examination: The patient is a very pleasant 51 y.o. male in no acute distress. He appears well-developed and well-nourished and adequately groomed.   HEENT: Normocephalic, atraumatic, pupils are equal, round and reactive to light and accommodation. Funduscopic exam is normal with sharp disc margins noted. Extraocular tracking is good without limitation to gaze excursion or nystagmus noted. Normal smooth pursuit is noted. Hearing is grossly intact. Tympanic membranes are clear  bilaterally. Face is symmetric with normal facial animation and normal facial sensation. Speech is clear with no dysarthria noted. There is no hypophonia. There is no lip, neck/head, jaw or voice tremor. Neck is supple with full range of passive and active motion. There are no carotid bruits on auscultation. Oropharynx exam reveals: mild mouth dryness, adequate dental hygiene and marked airway crowding, due to thicker soft palate, larger uvula, smaller airway entry. Mallampati is class II. Tongue protrudes centrally and palate elevates symmetrically. Tonsils are absent. Neck size is 17.5 inches. He has a Mild overbite.   Chest: Clear to auscultation without wheezing, rhonchi or crackles noted.  Heart: S1+S2+0, regular and normal without murmurs, rubs or gallops noted.   Abdomen: Soft, non-tender and non-distended with normal bowel sounds appreciated on auscultation.  Extremities: There is no edema in the distal lower extremities bilaterally. Pedal pulses are intact.  Skin: Warm and dry without trophic changes noted.  Musculoskeletal:  exam reveals no obvious joint deformities, tenderness or joint swelling or erythema.   Neurologically:  Mental status: The patient is awake, alert and oriented in all 4 spheres. His immediate and remote memory, attention, language skills and fund of knowledge are appropriate. There is no evidence of aphasia, agnosia, apraxia or anomia. Speech is clear with normal prosody and enunciation. Thought process is linear. Mood is normal and affect is normal.  Cranial nerves II - XII are as described above under HEENT exam. In addition: shoulder shrug is normal with equal shoulder height noted. Motor exam: Normal bulk, strength and tone is noted. There is no drift, tremor or rebound. Romberg is negative. Reflexes are 2+ throughout. Fine motor skills and coordination: intact with normal finger taps, normal hand movements, normal rapid alternating patting, normal foot taps and  normal foot agility.  Cerebellar testing: No dysmetria or intention tremor on finger to nose testing. Heel to shin is unremarkable bilaterally. There is no truncal or gait ataxia.  Sensory exam: intact to light touch in the upper and lower extremities.  Gait, station and balance: He stands easily. No veering to one side is noted. No leaning to one side is noted. Posture is age-appropriate and stance is narrow based. Gait shows normal stride length and normal pace. No problems turning are noted. Tandem walk is unremarkable.                Assessment and Plan:  In summary, BLAZE NYLUND is a very pleasant 51 y.o.-year old male with an underlying medical history of kidney stones, reflux disease, peptic ulcer disease, arthritis, anxiety, status post recent laparoscopic cholecystectomy on 01/19/2017, and obesity, whose history and physical exam are in keeping with obstructive sleep apnea (OSA), associated with severe sleepiness. I had a long chat with the patient and [name] about my findings and the diagnosis of OSA, its prognosis and treatment options. We talked about medical treatments, surgical interventions and non-pharmacological approaches. I explained in particular the risks and ramifications of untreated moderate to severe OSA, especially with respect to developing cardiovascular disease down the Road, including congestive heart failure, difficult to treat hypertension, cardiac arrhythmias, or stroke. Even type 2 diabetes has, in part, been linked to untreated OSA. Symptoms of untreated OSA include daytime sleepiness, memory problems, mood irritability and mood disorder such as depression and anxiety, lack of energy, as well as recurrent headaches, especially morning headaches. We talked about trying to maintain a healthy lifestyle in general, as well as the importance of weight control. I encouraged the patient to eat healthy, exercise daily and keep well hydrated, to keep a scheduled bedtime and wake  time routine, to not skip any meals and eat healthy snacks in between meals. I advised the patient not to drive when feeling sleepy. I recommended the following at this time: sleep study with potential positive airway pressure titration. (We will score hypopneas at 3%).   I explained the sleep test procedure to the patient and also outlined possible surgical and non-surgical treatment options of OSA, including the use of a custom-made dental device (which would require a referral to a specialist dentist or oral surgeon), upper airway surgical options, such as pillar implants, radiofrequency surgery, tongue base surgery, and UPPP (which would involve a referral to an ENT surgeon). Rarely, jaw surgery such as mandibular advancement may be considered.  I also explained the CPAP treatment option to the patient, who indicated that he would be willing to try CPAP if the need arises.  I explained the importance of being compliant with PAP treatment, not only for insurance purposes but primarily to improve His symptoms, and for the patient's long term health benefit, including to reduce His cardiovascular risks. I answered all their questions today and the patient and his GF were in agreement. I would like to see him back after the sleep study is completed and encouraged him to call with any interim questions, concerns, problems or updates.   Thank you very much for allowing me to participate in the care of this nice patient. If I can be of any further assistance to you please do not hesitate to call me at [phone].  Sincerely,   Star Age, MD, PhD

## 2017-03-15 NOTE — Addendum Note (Signed)
Addended by: Star Age on: 03/15/2017 10:55 AM   Modules accepted: Orders

## 2017-03-22 ENCOUNTER — Ambulatory Visit (INDEPENDENT_AMBULATORY_CARE_PROVIDER_SITE_OTHER): Payer: BLUE CROSS/BLUE SHIELD | Admitting: Neurology

## 2017-03-22 DIAGNOSIS — G4733 Obstructive sleep apnea (adult) (pediatric): Secondary | ICD-10-CM

## 2017-03-22 DIAGNOSIS — G4734 Idiopathic sleep related nonobstructive alveolar hypoventilation: Secondary | ICD-10-CM

## 2017-03-24 NOTE — Addendum Note (Signed)
Addended by: Star Age on: 03/24/2017 02:11 PM   Modules accepted: Orders

## 2017-03-24 NOTE — Procedures (Signed)
  [facility] [place], [address] [place], [place] [address]  NAME: Johnny Rios DOB: 05-Oct-1966 MEDICAL RECORD [id number]  DOS: 03/22/17 REFERRING PHYSICIAN: Fabio Asa  Study Performed:  HST/Out of Center Sleep Test  HISTORY: 51 year old man with a history of kidney stones, reflux disease, peptic ulcer disease, arthritis, anxiety, status post recent laparoscopic cholecystectomy on 01/19/2017, and obesity, who reports snoring and severe/excessive daytime somnolence. His Epworth sleepiness score is 22 out of 24 today.   STUDY RESULTS:  Total Recording Time: 6h 54 min (duration)  Total Apnea/Hypopnea Index (AHI): 52.9/hour  Average Oxygen Saturation: 89%  Time below 88% saturation: 209 min  Lowest Oxygen Saturation: 65%  Average Mean Heart Rate: 86 bpm  IMPRESSION:   Obstructive Sleep Apnea (OSA), nocturnal hypoxemia    RECOMMENDATION: This home sleep test demonstrates severe obstructive sleep apnea with severe desaturations, and evidence of nocturnal hypoxemia, with a total AHI of 52.9/hour and O2 nadir of 65% and time below 88% saturation of over 3 hours. Given the severity of his OSA, the patient's medical history and sleep related complaints, treatment with positive airway pressure (in the form of CPAP) is recommended. This will require a full night CPAP titration study for proper treatment settings and mask fitting.  Please note that untreated obstructive sleep apnea carries additional perioperative morbidity. Patients with significant obstructive sleep apnea should receive perioperative PAP therapy and the surgeons and particularly the anesthesiologist should be informed of the diagnosis and the severity of the sleep disordered breathing. The patient should be cautioned not to drive, work at heights, or operate dangerous or heavy equipment when tired or sleepy. Review and reiteration of good sleep hygiene measures should be pursued with  any patient. The patient and his referring provider will be notified of the test results. The patient will be seen in follow up in sleep clinic at [facility].  I certify that I have reviewed the raw data recording prior to the issuance of this report in accordance with the standards of Accreditation of the American Academy of Sleep medicine (AASM).  Star Age, MD, PhD [facility]) [place], ABPN (Neurology and Sleep Medicine)

## 2017-03-24 NOTE — Progress Notes (Signed)
Patient referred by Dr. Jeananne Rama, seen by me on 03/09/17, HST on 03/22/17:  Please call and notify the patient that the recent home sleep test did suggest the diagnosis of severe obstructive sleep apnea with severe desaturations, and that I recommend treatment for this in the form of CPAP. I will request an overnight sleep study for proper titration and mask fitting. Please explain to patient and arrange for a CPAP titration study. I have placed an order in the chart. Thanks, and please route to [facility] for scheduling.   Star Age, MD, PhD [facility])

## 2017-03-28 ENCOUNTER — Telehealth: Payer: Self-pay | Admitting: Neurology

## 2017-03-28 ENCOUNTER — Telehealth: Payer: Self-pay

## 2017-03-28 DIAGNOSIS — G4733 Obstructive sleep apnea (adult) (pediatric): Secondary | ICD-10-CM

## 2017-03-28 NOTE — Telephone Encounter (Signed)
I spoke to patient and he is aware of results and recommendations. I advised him that there is a pending note in his chart stating that insurance has denied titration study and we will advise further when Dr. Rexene Alberts returns to the office. Patient voiced understanding and states that he is willing to pursue treatment.

## 2017-03-28 NOTE — Telephone Encounter (Signed)
[employer] denied CPAP and suggest autopap.

## 2017-03-28 NOTE — Telephone Encounter (Signed)
-----   Message from Star Age, MD sent at 03/24/2017  2:11 PM EDT ----- Patient referred by Dr. Jeananne Rama, seen by me on 03/09/17, HST on 03/22/17:  Please call and notify the patient that the recent home sleep test did suggest the diagnosis of severe obstructive sleep apnea with severe desaturations, and that I recommend treatment for this in the form of CPAP. I will request an overnight sleep study for proper titration and mask fitting. Please explain to patient and arrange for a CPAP titration study. I have placed an order in the chart. Thanks, and please route to [facility] for scheduling.   Star Age, MD, PhD [facility])

## 2017-04-03 NOTE — Telephone Encounter (Signed)
I called pt to discuss. No answer, left a message asking him to call me back. 

## 2017-04-03 NOTE — Telephone Encounter (Signed)
We will set patient up with autoPAP at home, as insurance denied in house titration study for OSA. Pls process order and notify patient and set up FU in 10 weeks.       

## 2017-04-03 NOTE — Addendum Note (Signed)
Addended by: Star Age on: 04/03/2017 01:16 PM   Modules accepted: Orders

## 2017-04-04 NOTE — Telephone Encounter (Signed)
I called pt. I advised pt that his in lab titration study was denied by his insurance company but that Dr. Rexene Alberts recommends starting an auto pap. Pt is agreeable to this. I advised him that I will send the order for an auto pap to a DME, [employer], and they will call the pt within a week to discuss set up, financial information, mask fit, etc. I reviewed pap compliance expectations with the pt. A follow up appt was made for 06/20/2017 at 10:30am with Dr. Rexene Alberts. Pt verbalized understanding of these recommendations. Pt had no questions at this time but was encouraged to call back if questions arise.

## 2017-04-04 NOTE — Telephone Encounter (Signed)
See telephone note from 04/04/2017.

## 2017-04-04 NOTE — Telephone Encounter (Signed)
Patient's girlfriend is returning a call regarding patient.

## 2017-04-11 ENCOUNTER — Other Ambulatory Visit: Payer: Self-pay | Admitting: Family Medicine

## 2017-04-11 NOTE — Telephone Encounter (Signed)
Last (acute) OV: 12/28/16 Last routine OV: 10/31/16 Next OV: None on file.

## 2017-04-11 NOTE — Telephone Encounter (Signed)
Call pt 

## 2017-04-12 NOTE — Telephone Encounter (Signed)
Phone call Discussed with patient's taking too much clonazepam discussed need to office visit to further discuss patient's very confusing to understand how Klonopin

## 2017-04-25 ENCOUNTER — Encounter: Payer: Self-pay | Admitting: Family Medicine

## 2017-04-25 ENCOUNTER — Ambulatory Visit (INDEPENDENT_AMBULATORY_CARE_PROVIDER_SITE_OTHER): Payer: BLUE CROSS/BLUE SHIELD | Admitting: Family Medicine

## 2017-04-25 VITALS — BP 128/82 | HR 99 | Ht 70.0 in | Wt 263.0 lb

## 2017-04-25 DIAGNOSIS — F419 Anxiety disorder, unspecified: Secondary | ICD-10-CM

## 2017-04-25 MED ORDER — CLONAZEPAM 1 MG PO TABS: 1.0000 mg | ORAL_TABLET | Freq: Every day | ORAL | 2 refills | Status: DC | PRN

## 2017-04-25 MED ORDER — ESOMEPRAZOLE MAGNESIUM 20 MG PO CPDR: 20.0000 mg | DELAYED_RELEASE_CAPSULE | ORAL | 4 refills | Status: DC

## 2017-04-25 NOTE — Progress Notes (Signed)
   BP 128/82 (BP Location: Left Arm)   Pulse 99   Ht 5\' 10"  (1.778 m)   Wt 263 lb (119.3 kg)   SpO2 100%   BMI 37.74 kg/m    Subjective:    [name], male    DOB: Feb 03, 1966, 51 y.o.   MRN: [id number]  HPI: Johnny Rios is a 51 y.o. male  Chief Complaint  Patient presents with  . Medication Refill  . Procedure    Gallbladder removed on     Relevant past medical, surgical, family and social history reviewed and updated as indicated. Interim medical history since our last visit reviewed. Allergies and medications reviewed and updated.  Review of Systems  Per HPI unless specifically indicated above     Objective:    BP 128/82 (BP Location: Left Arm)   Pulse 99   Ht 5\' 10"  (1.778 m)   Wt 263 lb (119.3 kg)   SpO2 100%   BMI 37.74 kg/m   Wt Readings from Last 3 Encounters:  04/25/17 263 lb (119.3 kg)  03/09/17 266 lb (120.7 kg)  01/26/17 263 lb (119.3 kg)    Physical Exam      Assessment & Plan:   Problem List Items Addressed This Visit      Other   Anxiety - Primary   Relevant Medications   clonazePAM (KLONOPIN) 1 MG tablet       Follow up plan: Return in about 3 months (around 07/26/2017) for Physical Exam.

## 2017-05-18 ENCOUNTER — Ambulatory Visit: Payer: BLUE CROSS/BLUE SHIELD

## 2017-05-30 ENCOUNTER — Encounter: Payer: Self-pay | Admitting: *Deleted

## 2017-06-05 ENCOUNTER — Encounter: Payer: Self-pay | Admitting: General Surgery

## 2017-06-05 ENCOUNTER — Ambulatory Visit (INDEPENDENT_AMBULATORY_CARE_PROVIDER_SITE_OTHER): Payer: BLUE CROSS/BLUE SHIELD | Admitting: General Surgery

## 2017-06-05 VITALS — BP 124/80 | HR 89 | Resp 14 | Ht 70.0 in | Wt 262.0 lb

## 2017-06-05 DIAGNOSIS — K429 Umbilical hernia without obstruction or gangrene: Secondary | ICD-10-CM

## 2017-06-05 NOTE — Progress Notes (Signed)
[name], male   DOB: [birth year], 51 y.o.   MRN: [id number]  Chief Complaint  Patient presents with  . Routine Post Op    gallbladder surgery    HPI Johnny Rios is a 51 y.o. male here with concerns about port site from gallbladder surgery. He reports bulging at the belly button site that started 3-4 weeks ago. He reports no pain in the area. He reports the area has increased in size. He reports no problems with using the bathroom and is going more frequently since his surgery. He is here today with his girlfriend Johnny Rios.  HPI  Past Medical History:  Diagnosis Date  . Anxiety   . Arthritis   . GERD (gastroesophageal reflux disease)   . History of kidney stones   . Sleep apnea    Uses CPAP    Past Surgical History:  Procedure Laterality Date  . APPENDECTOMY    . CHOLECYSTECTOMY N/A 01/19/2017   Procedure: LAPAROSCOPIC CHOLECYSTECTOMY WITH INTRAOPERATIVE CHOLANGIOGRAM;  Surgeon: Robert Bellow, MD;  Location: [facility] ORS;  Service: General;  Laterality: N/A;  . TONSILLECTOMY      Family History  Problem Relation Age of Onset  . Cancer Father        lung  . Alzheimer's disease Mother     Social History Social History  Substance Use Topics  . Smoking status: Never Smoker  . Smokeless tobacco: Former Systems developer    Types: Chew  . Alcohol use Yes     Comment: occasional    No Known Allergies  Current Outpatient Prescriptions  Medication Sig Dispense Refill  . clonazePAM (KLONOPIN) 1 MG tablet Take 1-2 tablets (1-2 mg total) by mouth daily as needed for anxiety. 45 tablet 2  . esomeprazole (NEXIUM) 20 MG capsule Take 1 capsule (20 mg total) by mouth every morning. (Patient taking differently: Take 20 mg by mouth daily before breakfast. ) 90 capsule 4  . acetaminophen (TYLENOL) 500 MG tablet Take 1,000 mg by mouth every 6 (six) hours as needed (for pain).     No current facility-administered medications for this visit.     Review of Systems Review of  Systems  Constitutional: Negative.   Respiratory: Negative.   Cardiovascular: Negative.     Blood pressure 124/80, pulse 89, resp. rate 14, height 5\' 10"  (1.778 m), weight 262 lb (118.8 kg).  Physical Exam Physical Exam  Constitutional: He is oriented to person, place, and time. He appears well-nourished.  Cardiovascular: Normal rate, regular rhythm and normal heart sounds.   Pulmonary/Chest: Effort normal and breath sounds normal.  Abdominal: Soft. There is no tenderness. A hernia (umbilical about 1.5 cm) is present.    Umbilical fascia had been closed with a 0 Vicryl suture at the time of surgery.  Neurological: He is alert and oriented to person, place, and time.  Skin: Skin is warm and dry.  Psychiatric: He has a normal mood and affect.    Data Reviewed 01/19/2017 pathology:  GALLBLADDER; CHOLECYSTECTOMY:  - CHRONIC CHOLECYSTITIS AND CHOLELITHIASIS.  - CHOLESTEROLOSIS.  - NEGATIVE FOR DYSPLASIA AND MALIGNANCY.   Assessment    Symptomatic umbilical hernia.    Plan    Indication for elective repair reviewed.  Based on its clinical size, I don't think that prosthetic mesh we required. If the defect is 3 cm or greater in diameter I have encouraged the patient to allow me to place a prosthetic mesh as this will significantly lower the risk of recurrence. Risks  of infection with or without mesh placement were reviewed.      HPI, Physical Exam, Assessment and Plan have been scribed under the direction and in the presence of Robert Bellow, MD  Concepcion Living, LPN I have completed the exam and reviewed the above documentation for accuracy and completeness.  I agree with the above.  Haematologist has been used and any errors in dictation or transcription are unintentional.  Hervey Ard, M.D., F.A.C.S.    Robert Bellow 06/05/2017, 1:02 PM   Patient's surgery has been scheduled for 06-12-17 at [facility].  Dominga Ferry, CMA

## 2017-06-05 NOTE — Patient Instructions (Signed)
Umbilical Hernia, Adult A hernia is a bulge of tissue that pushes through an opening between muscles. An umbilical hernia happens in the abdomen, near the belly button (umbilicus). The hernia may contain tissues from the small intestine, large intestine, or fatty tissue covering the intestines (omentum). Umbilical hernias in adults tend to get worse over time, and they require surgical treatment. There are several types of umbilical hernias. You may have:  A hernia located just above or below the umbilicus (indirect hernia). This is the most common type of umbilical hernia in adults.  A hernia that forms through an opening formed by the umbilicus (direct hernia).  A hernia that comes and goes (reducible hernia). A reducible hernia may be visible only when you strain, lift something heavy, or cough. This type of hernia can be pushed back into the abdomen (reduced).  A hernia that traps abdominal tissue inside the hernia (incarcerated hernia). This type of hernia cannot be reduced.  A hernia that cuts off blood flow to the tissues inside the hernia (strangulated hernia). The tissues can start to die if this happens. This type of hernia requires emergency treatment.  What are the causes? An umbilical hernia happens when tissue inside the abdomen presses on a weak area of the abdominal muscles. What increases the risk? You may have a greater risk of this condition if you:  Are obese.  Have had several pregnancies.  Have a buildup of fluid inside your abdomen (ascites).  Have had surgery that weakens the abdominal muscles.  What are the signs or symptoms? The main symptom of this condition is a painless bulge at or near the belly button. A reducible hernia may be visible only when you strain, lift something heavy, or cough. Other symptoms may include:  Dull pain.  A feeling of pressure.  Symptoms of a strangulated hernia may include:  Pain that gets increasingly worse.  Nausea and  vomiting.  Pain when pressing on the hernia.  Skin over the hernia becoming red or purple.  Constipation.  Blood in the stool.  How is this diagnosed? This condition may be diagnosed based on:  A physical exam. You may be asked to cough or strain while standing. These actions increase the pressure inside your abdomen and force the hernia through the opening in your muscles. Your health care provider may try to reduce the hernia by pressing on it.  Your symptoms and medical history.  How is this treated? Surgery is the only treatment for an umbilical hernia. Surgery for a strangulated hernia is done as soon as possible. If you have a small hernia that is not incarcerated, you may need to lose weight before having surgery. Follow these instructions at home:  Lose weight, if told by your health care provider.  Do not try to push the hernia back in.  Watch your hernia for any changes in color or size. Tell your health care provider if any changes occur.  You may need to avoid activities that increase pressure on your hernia.  Do not lift anything that is heavier than 10 lb (4.5 kg) until your health care provider says that this is safe.  Take over-the-counter and prescription medicines only as told by your health care provider.  Keep all follow-up visits as told by your health care provider. This is important. Contact a health care provider if:  Your hernia gets larger.  Your hernia becomes painful. Get help right away if:  You develop sudden, severe pain near the   area of your hernia.  You have pain as well as nausea or vomiting.  You have pain and the skin over your hernia changes color.  You develop a fever. This information is not intended to replace advice given to you by your health care provider. Make sure you discuss any questions you have with your health care provider. Document Released: 05/13/2016 Document Revised: 08/14/2016 Document Reviewed:  05/13/2016 Elsevier Interactive Patient Education  2018 [employer]  

## 2017-06-07 ENCOUNTER — Encounter
Admission: RE | Admit: 2017-06-07 | Discharge: 2017-06-07 | Disposition: A | Discharge status: Home / Self Care | Payer: BLUE CROSS/BLUE SHIELD | Source: Ambulatory Visit | Attending: General Surgery | Admitting: General Surgery

## 2017-06-07 NOTE — Patient Instructions (Signed)
  Your procedure is scheduled on: 06/12/17 Report to Same Day Surgery 2nd floor medical mall [facility] Entrance-take elevator on left to 2nd floor.  Check in with surgery information desk.) To find out your arrival time please call [phone] between 1PM - 3PM on 06/09/17  Remember: Instructions that are not followed completely may result in serious medical risk, up to and including death, or upon the discretion of your surgeon and anesthesiologist your surgery may need to be rescheduled.    _x___ 1. Do not eat food or drink liquids after midnight. No gum chewing or                              hard candies.     __x__ 2. No Alcohol for 24 hours before or after surgery.   __x__3. No Smoking for 24 prior to surgery.   ____  4. Bring all medications with you on the day of surgery if instructed.    __x__ 5. Notify your doctor if there is any change in your medical condition     (cold, fever, infections).     Do not wear jewelry, make-up, hairpins, clips or nail polish.  Do not wear lotions, powders, or perfumes. You may wear deodorant.  Do not shave 48 hours prior to surgery. Men may shave face and neck.  Do not bring valuables to the hospital.    [facility] is not responsible for any belongings or valuables.               Contacts, dentures or bridgework may not be worn into surgery.  Leave your suitcase in the car. After surgery it may be brought to your room.  For patients admitted to the hospital, discharge time is determined by your                       treatment team.   Patients discharged the day of surgery will not be allowed to drive home.  You will need someone to drive you home and stay with you the night of your procedure.    Please read over the following fact sheets that you were given:   [facility] Preparing for Surgery and or MRSA Information   _x___ Take anti-hypertensive (unless it includes a diuretic), cardiac, seizure, asthma,     anti-reflux and psychiatric  medicines. These include:  1. [place]  2. CLONAZEPAM  3.  4.  5.  6.  ____Fleets enema or Magnesium Citrate as directed.   ____ Use CHG Soap or sage wipes as directed on instruction sheet   ____ Use inhalers on the day of surgery and bring to hospital day of surgery  ____ Stop Metformin and Janumet 2 days prior to surgery.    ____ Take 1/2 of usual insulin dose the night before surgery and none on the morning     surgery.   ____ Follow recommendations from Cardiologist, Pulmonologist or PCP regarding stopping Aspirin, Coumadin, Pllavix ,Eliquis, Effient, or Pradaxa, and Pletal.  ____Stop Anti-inflammatories such as Advil, Aleve, Ibuprofen, Motrin, Naproxen, Naprosyn, Goodies powders or aspirin products. OK to take Tylenol and   Celebrex.   ____ Stop supplements until after surgery.  But may continue Vitamin D, Vitamin B, and multivitamin.   ____ Bring C-Pap to the hospital.

## 2017-06-11 MED ORDER — CEFAZOLIN SODIUM-DEXTROSE 2-4 GM/100ML-% IV SOLN: 2.0000 g | INTRAVENOUS | Status: AC

## 2017-06-12 ENCOUNTER — Encounter: Payer: Self-pay | Admitting: *Deleted

## 2017-06-12 ENCOUNTER — Encounter
Admission: RE | Disposition: A | Discharge status: Home / Self Care | Payer: Self-pay | Source: Ambulatory Visit | Attending: General Surgery

## 2017-06-12 ENCOUNTER — Other Ambulatory Visit: Payer: Self-pay | Admitting: Family Medicine

## 2017-06-12 ENCOUNTER — Ambulatory Visit
Admission: RE | Admit: 2017-06-12 | Discharge: 2017-06-12 | Disposition: A | Discharge status: Home / Self Care | Payer: BLUE CROSS/BLUE SHIELD | Source: Ambulatory Visit | Attending: General Surgery | Admitting: General Surgery

## 2017-06-12 ENCOUNTER — Ambulatory Visit: Payer: BLUE CROSS/BLUE SHIELD | Admitting: Anesthesiology

## 2017-06-12 DIAGNOSIS — K219 Gastro-esophageal reflux disease without esophagitis: Secondary | ICD-10-CM | POA: Diagnosis not present

## 2017-06-12 DIAGNOSIS — J449 Chronic obstructive pulmonary disease, unspecified: Secondary | ICD-10-CM | POA: Insufficient documentation

## 2017-06-12 DIAGNOSIS — K439 Ventral hernia without obstruction or gangrene: Secondary | ICD-10-CM | POA: Insufficient documentation

## 2017-06-12 DIAGNOSIS — I2510 Atherosclerotic heart disease of native coronary artery without angina pectoris: Secondary | ICD-10-CM | POA: Insufficient documentation

## 2017-06-12 DIAGNOSIS — I10 Essential (primary) hypertension: Secondary | ICD-10-CM | POA: Insufficient documentation

## 2017-06-12 DIAGNOSIS — M1990 Unspecified osteoarthritis, unspecified site: Secondary | ICD-10-CM | POA: Diagnosis not present

## 2017-06-12 DIAGNOSIS — F419 Anxiety disorder, unspecified: Secondary | ICD-10-CM | POA: Diagnosis not present

## 2017-06-12 DIAGNOSIS — G4730 Sleep apnea, unspecified: Secondary | ICD-10-CM | POA: Insufficient documentation

## 2017-06-12 DIAGNOSIS — I252 Old myocardial infarction: Secondary | ICD-10-CM | POA: Diagnosis not present

## 2017-06-12 DIAGNOSIS — E669 Obesity, unspecified: Secondary | ICD-10-CM | POA: Diagnosis not present

## 2017-06-12 DIAGNOSIS — Z6837 Body mass index (BMI) 37.0-37.9, adult: Secondary | ICD-10-CM | POA: Diagnosis not present

## 2017-06-12 DIAGNOSIS — K429 Umbilical hernia without obstruction or gangrene: Secondary | ICD-10-CM

## 2017-06-12 HISTORY — PX: UMBILICAL HERNIA REPAIR: SHX196

## 2017-06-12 SURGERY — REPAIR, HERNIA, UMBILICAL, ADULT
Anesthesia: General | Wound class: Clean

## 2017-06-12 MED ORDER — LACTATED RINGERS IV SOLN
INTRAVENOUS | Status: DC
  Administered 2017-06-12: 08:00:00 via INTRAVENOUS

## 2017-06-12 MED ORDER — ACETAMINOPHEN 10 MG/ML IV SOLN
INTRAVENOUS | Status: DC | PRN
  Administered 2017-06-12: 1000 mg via INTRAVENOUS

## 2017-06-12 MED ORDER — ONDANSETRON HCL 4 MG/2ML IJ SOLN
INTRAMUSCULAR | Status: AC
  Filled 2017-06-12: qty 2

## 2017-06-12 MED ORDER — SODIUM BICARBONATE 4 % IV SOLN
INTRAVENOUS | Status: AC
  Filled 2017-06-12: qty 5

## 2017-06-12 MED ORDER — PROPOFOL 10 MG/ML IV BOLUS
INTRAVENOUS | Status: AC
  Filled 2017-06-12: qty 20

## 2017-06-12 MED ORDER — ONDANSETRON HCL 4 MG/2ML IJ SOLN
INTRAMUSCULAR | Status: DC | PRN
  Administered 2017-06-12: 4 mg via INTRAVENOUS

## 2017-06-12 MED ORDER — FENTANYL CITRATE (PF) 100 MCG/2ML IJ SOLN
INTRAMUSCULAR | Status: AC
  Filled 2017-06-12: qty 2

## 2017-06-12 MED ORDER — ROCURONIUM BROMIDE 50 MG/5ML IV SOLN
INTRAVENOUS | Status: AC
  Filled 2017-06-12: qty 1

## 2017-06-12 MED ORDER — PROMETHAZINE HCL 25 MG/ML IJ SOLN: 6.2500 mg | INTRAMUSCULAR | Status: DC | PRN

## 2017-06-12 MED ORDER — BUPIVACAINE HCL (PF) 0.5 % IJ SOLN
INTRAMUSCULAR | Status: AC
  Filled 2017-06-12: qty 30

## 2017-06-12 MED ORDER — MIDAZOLAM HCL 2 MG/2ML IJ SOLN
INTRAMUSCULAR | Status: AC
  Filled 2017-06-12: qty 2

## 2017-06-12 MED ORDER — BUPIVACAINE HCL 0.5 % IJ SOLN
INTRAMUSCULAR | Status: DC | PRN
  Administered 2017-06-12: 30 mL

## 2017-06-12 MED ORDER — SUGAMMADEX SODIUM 200 MG/2ML IV SOLN
INTRAVENOUS | Status: DC | PRN
  Administered 2017-06-12: 240 mg via INTRAVENOUS

## 2017-06-12 MED ORDER — HYDROCODONE-ACETAMINOPHEN 5-325 MG PO TABS: 1.0000 | ORAL_TABLET | ORAL | 0 refills | Status: DC | PRN

## 2017-06-12 MED ORDER — ACETAMINOPHEN 10 MG/ML IV SOLN
INTRAVENOUS | Status: AC
  Filled 2017-06-12: qty 100

## 2017-06-12 MED ORDER — LIDOCAINE HCL (PF) 1 % IJ SOLN
INTRAMUSCULAR | Status: AC
  Filled 2017-06-12: qty 30

## 2017-06-12 MED ORDER — FENTANYL CITRATE (PF) 100 MCG/2ML IJ SOLN
25.0000 ug | INTRAMUSCULAR | Status: DC | PRN
  Administered 2017-06-12 (×3): 50 ug via INTRAVENOUS

## 2017-06-12 MED ORDER — SUCCINYLCHOLINE CHLORIDE 20 MG/ML IJ SOLN
INTRAMUSCULAR | Status: AC
  Filled 2017-06-12: qty 1

## 2017-06-12 MED ORDER — OXYCODONE HCL 5 MG PO TABS
ORAL_TABLET | ORAL | Status: AC
  Filled 2017-06-12: qty 1

## 2017-06-12 MED ORDER — FENTANYL CITRATE (PF) 100 MCG/2ML IJ SOLN
50.0000 ug | INTRAMUSCULAR | Status: DC | PRN
  Administered 2017-06-12: 50 ug via INTRAVENOUS

## 2017-06-12 MED ORDER — ROCURONIUM BROMIDE 100 MG/10ML IV SOLN
INTRAVENOUS | Status: DC | PRN
  Administered 2017-06-12: 20 mg via INTRAVENOUS

## 2017-06-12 MED ORDER — PROPOFOL 10 MG/ML IV BOLUS
INTRAVENOUS | Status: DC | PRN
  Administered 2017-06-12: 200 mg via INTRAVENOUS

## 2017-06-12 MED ORDER — CEFAZOLIN SODIUM-DEXTROSE 2-3 GM-% IV SOLR
INTRAVENOUS | Status: DC | PRN
  Administered 2017-06-12: 2 g via INTRAVENOUS

## 2017-06-12 MED ORDER — PHENYLEPHRINE HCL 10 MG/ML IJ SOLN
INTRAMUSCULAR | Status: DC | PRN
  Administered 2017-06-12: 100 ug via INTRAVENOUS

## 2017-06-12 MED ORDER — OXYCODONE HCL 5 MG/5ML PO SOLN: 5.0000 mg | Freq: Once | ORAL | Status: AC | PRN

## 2017-06-12 MED ORDER — CEFAZOLIN SODIUM-DEXTROSE 2-4 GM/100ML-% IV SOLN
INTRAVENOUS | Status: AC
  Filled 2017-06-12: qty 100

## 2017-06-12 MED ORDER — MEPERIDINE HCL 50 MG/ML IJ SOLN: 6.2500 mg | INTRAMUSCULAR | Status: DC | PRN

## 2017-06-12 MED ORDER — FENTANYL CITRATE (PF) 100 MCG/2ML IJ SOLN
INTRAMUSCULAR | Status: AC
  Administered 2017-06-12: 50 ug via INTRAVENOUS
  Filled 2017-06-12: qty 2

## 2017-06-12 MED ORDER — DEXAMETHASONE SODIUM PHOSPHATE 10 MG/ML IJ SOLN
INTRAMUSCULAR | Status: DC | PRN
  Administered 2017-06-12: 5 mg via INTRAVENOUS

## 2017-06-12 MED ORDER — KETOROLAC TROMETHAMINE 30 MG/ML IJ SOLN
INTRAMUSCULAR | Status: AC
  Filled 2017-06-12: qty 1

## 2017-06-12 MED ORDER — LIDOCAINE HCL (CARDIAC) 20 MG/ML IV SOLN
INTRAVENOUS | Status: DC | PRN
  Administered 2017-06-12: 80 mg via INTRAVENOUS

## 2017-06-12 MED ORDER — SUCCINYLCHOLINE CHLORIDE 20 MG/ML IJ SOLN
INTRAMUSCULAR | Status: DC | PRN
  Administered 2017-06-12: 100 mg via INTRAVENOUS

## 2017-06-12 MED ORDER — LIDOCAINE HCL (PF) 2 % IJ SOLN
INTRAMUSCULAR | Status: AC
  Filled 2017-06-12: qty 2

## 2017-06-12 MED ORDER — KETOROLAC TROMETHAMINE 30 MG/ML IJ SOLN
INTRAMUSCULAR | Status: DC | PRN
  Administered 2017-06-12: 30 mg via INTRAVENOUS

## 2017-06-12 MED ORDER — DEXAMETHASONE SODIUM PHOSPHATE 10 MG/ML IJ SOLN
INTRAMUSCULAR | Status: AC
  Filled 2017-06-12: qty 1

## 2017-06-12 MED ORDER — MIDAZOLAM HCL 2 MG/2ML IJ SOLN
INTRAMUSCULAR | Status: DC | PRN
  Administered 2017-06-12: 2 mg via INTRAVENOUS

## 2017-06-12 MED ORDER — SUGAMMADEX SODIUM 500 MG/5ML IV SOLN
INTRAVENOUS | Status: AC
  Filled 2017-06-12: qty 5

## 2017-06-12 MED ORDER — OXYCODONE HCL 5 MG PO TABS
5.0000 mg | ORAL_TABLET | Freq: Once | ORAL | Status: AC | PRN
  Administered 2017-06-12: 5 mg via ORAL

## 2017-06-12 MED ORDER — FENTANYL CITRATE (PF) 100 MCG/2ML IJ SOLN
INTRAMUSCULAR | Status: DC | PRN
  Administered 2017-06-12 (×2): 50 ug via INTRAVENOUS

## 2017-06-12 SURGICAL SUPPLY — 32 items
BLADE SURG 15 STRL SS SAFETY (BLADE) ×2 IMPLANT
CANISTER SUCT 1200ML W/VALVE (MISCELLANEOUS) ×2 IMPLANT
CHLORAPREP W/TINT 26ML (MISCELLANEOUS) ×2 IMPLANT
DRAPE LAPAROTOMY 100X77 ABD (DRAPES) ×2 IMPLANT
DRSG TEGADERM 4X4.75 (GAUZE/BANDAGES/DRESSINGS) ×2 IMPLANT
DRSG TELFA 4X3 1S NADH ST (GAUZE/BANDAGES/DRESSINGS) ×2 IMPLANT
ELECT REM PT RETURN 9FT ADLT (ELECTROSURGICAL) ×2
ELECTRODE REM PT RTRN 9FT ADLT (ELECTROSURGICAL) ×1 IMPLANT
GLOVE BIO SURGEON STRL SZ7.5 (GLOVE) ×6 IMPLANT
GLOVE INDICATOR 8.0 STRL GRN (GLOVE) ×4 IMPLANT
GOWN STRL REUS W/ TWL LRG LVL3 (GOWN DISPOSABLE) ×2 IMPLANT
GOWN STRL REUS W/TWL LRG LVL3 (GOWN DISPOSABLE) ×2
GRASPER SUT TROCAR 14GX15 (MISCELLANEOUS) ×2 IMPLANT
KIT RM TURNOVER STRD PROC AR (KITS) ×2 IMPLANT
LABEL OR SOLS (LABEL) ×2 IMPLANT
MESH VENTRALEX ST 2.5 CRC MED (Mesh General) ×2 IMPLANT
NDL SAFETY 22GX1.5 (NEEDLE) ×4 IMPLANT
NEEDLE HYPO 25X1 1.5 SAFETY (NEEDLE) ×2 IMPLANT
NS IRRIG 500ML POUR BTL (IV SOLUTION) ×2 IMPLANT
PACK BASIN MINOR ARMC (MISCELLANEOUS) ×2 IMPLANT
STRIP CLOSURE SKIN 1/2X4 (GAUZE/BANDAGES/DRESSINGS) ×2 IMPLANT
SUT MAXON ABS #0 GS21 30IN (SUTURE) ×4 IMPLANT
SUT PROLENE 0 CT 1 30 (SUTURE) IMPLANT
SUT SURGILON 0 BLK (SUTURE) ×2 IMPLANT
SUT VIC AB 3-0 54X BRD REEL (SUTURE) ×1 IMPLANT
SUT VIC AB 3-0 BRD 54 (SUTURE) ×1
SUT VIC AB 3-0 SH 27 (SUTURE) ×1
SUT VIC AB 3-0 SH 27X BRD (SUTURE) ×1 IMPLANT
SUT VIC AB 4-0 FS2 27 (SUTURE) ×2 IMPLANT
SWABSTK COMLB BENZOIN TINCTURE (MISCELLANEOUS) ×2 IMPLANT
SYR 3ML LL SCALE MARK (SYRINGE) ×2 IMPLANT
SYR CONTROL 10ML (SYRINGE) ×2 IMPLANT

## 2017-06-12 NOTE — H&P (Signed)
No change in clinical exam or history. For umbilical hernia repair.

## 2017-06-12 NOTE — Anesthesia Procedure Notes (Signed)
Procedure Name: Intubation Date/Time: 06/12/2017 9:19 AM Performed by: Johnna Acosta Pre-anesthesia Checklist: Emergency Drugs available, Patient identified, Suction available, Patient being monitored and Timeout performed Patient Re-evaluated:Patient Re-evaluated prior to inductionOxygen Delivery Method: Circle system utilized Preoxygenation: Pre-oxygenation with 100% oxygen Intubation Type: IV induction Ventilation: Mask ventilation without difficulty Laryngoscope Size: Miller and 2 Grade View: Grade I Tube type: Oral Tube size: 7.5 mm Number of attempts: 1 Airway Equipment and Method: Stylet Placement Confirmation: ETT inserted through vocal cords under direct vision,  positive ETCO2 and breath sounds checked- equal and bilateral Secured at: 22 cm Tube secured with: Tape Dental Injury: Teeth and Oropharynx as per pre-operative assessment

## 2017-06-12 NOTE — Discharge Instructions (Signed)

## 2017-06-12 NOTE — Op Note (Signed)
Preoperative diagnosis: Ventral hernia.  Postoperative diagnosis: Same.  Operative procedure: Repair of ventral hernia with 6 cm Ventralex ST mesh.  Operating surgeon: Ollen Bowl, M.D.  Anesthesia: Gen. endotracheal, Marcaine 0.5%, plain, 30 mL.  Estimated blood loss: Less than 5 mL.  Clinical note: This 51 year old male underwent a laparoscopic cholecystectomy last year. His umbilical site had been closed with Vicryl suture. He is developed a asymptomatic ventral hernia with incarcerated omentum. He was a better for elective repair. The possibility of prosthetic mesh placement was reviewed prior to procedure.  Operative note: With the patient under adequate general endotracheal anesthesia the abdomen was prepped with ChloraPrep and draped. Haired previously been removed with clippers. Marcaine was infiltrated for postoperative analgesia. An infraumbilical incision was made and carried aphthous consulting this tissue with hemostasis achieved by Dr. cautery. The umbilical skin was freed from the thickened hernia sac. The hernia sac was transected at the level of the fascia. This was approximated 3.5-4.0 cm in diameter. The undersurface of the fascia was cleared. It was elected to place mesh due to the defect size. Trans-fascial sutures of 0 Maxon were placed at the 12, 3, 6 and 9:00 position. The mesh was then smoothed into position. The midline fascia was easily approximated transversely with interrupted 0 Surgilon sutures. Each suture was used to grasp the underlying mesh to anchor it into position. The umbilical skin was tacked to the fascia with a 3-0 Vicryl figure-of-eight suture. The adipose layer was approximated with a running 3-0 Vicryl suture. Skin was closed with a running 4-0 Vicryl subcuticular suture. Benzoin, Steri-Strips, Telfa and Tegaderm dressing was applied.  The patient tolerated the procedure well and was taken to the recovery room in stable condition.

## 2017-06-12 NOTE — Transfer of Care (Signed)
Immediate Anesthesia Transfer of Care Note  Patient: Johnny Rios  Procedure(s) Performed: Procedure(s): HERNIA REPAIR UMBILICAL ADULT (N/A)  Patient Location: PACU  Anesthesia Type:General  Level of Consciousness: awake  Airway & Oxygen Therapy: Patient Spontanous Breathing and Patient connected to face mask oxygen  Post-op Assessment: Report given to RN and Post -op Vital signs reviewed and stable  Post vital signs: Reviewed and stable  Last Vitals:  Vitals:   06/12/17 0739  BP: (!) 149/100  Pulse: 96  Resp: 16  Temp: 36.7 C    Last Pain:  Vitals:   06/12/17 0739  TempSrc: Oral         Complications: No apparent anesthesia complications

## 2017-06-12 NOTE — Anesthesia Preprocedure Evaluation (Addendum)
Anesthesia Evaluation  Patient identified by MRN, date of birth, ID band Patient awake    Reviewed: Allergy & Precautions, NPO status , Patient's Chart, lab work & pertinent test results  History of Anesthesia Complications Negative for: history of anesthetic complications  Airway Mallampati: II  TM Distance: >3 FB Neck ROM: Full    Dental no notable dental hx.    Pulmonary sleep apnea and Continuous Positive Airway Pressure Ventilation , neg COPD,    breath sounds clear to auscultation- rhonchi (-) wheezing      Cardiovascular Exercise Tolerance: Good (-) hypertension(-) CAD and (-) Past MI  Rhythm:Regular Rate:Normal - Systolic murmurs and - Diastolic murmurs    Neuro/Psych Anxiety negative neurological ROS     GI/Hepatic Neg liver ROS, PUD, GERD  ,  Endo/Other  negative endocrine ROSneg diabetes  Renal/GU negative Renal ROS     Musculoskeletal  (+) Arthritis ,   Abdominal (+) + obese,   Peds  Hematology negative hematology ROS (+)   Anesthesia Other Findings Past Medical History: No date: Anxiety No date: Arthritis No date: GERD (gastroesophageal reflux disease) No date: History of kidney stones No date: Sleep apnea     Comment: Uses CPAP   Reproductive/Obstetrics                             Anesthesia Physical Anesthesia Plan  ASA: III  Anesthesia Plan: General   Post-op Pain Management:    Induction: Intravenous  PONV Risk Score and Plan: 1 and Ondansetron and Dexamethasone  Airway Management Planned: Oral ETT  Additional Equipment:   Intra-op Plan:   Post-operative Plan: Extubation in OR  Informed Consent: I have reviewed the patients History and Physical, chart, labs and discussed the procedure including the risks, benefits and alternatives for the proposed anesthesia with the patient or authorized representative who has indicated his/her understanding and  acceptance.   Dental advisory given  Plan Discussed with: CRNA and Anesthesiologist  Anesthesia Plan Comments:       Anesthesia Quick Evaluation

## 2017-06-12 NOTE — Anesthesia Post-op Follow-up Note (Cosign Needed)
Anesthesia QCDR form completed.        

## 2017-06-12 NOTE — Anesthesia Postprocedure Evaluation (Signed)
Anesthesia Post Note  Patient: BRAVERY KETCHAM  Procedure(s) Performed: Procedure(s) (LRB): HERNIA REPAIR UMBILICAL ADULT (N/A)  Patient location during evaluation: PACU Anesthesia Type: General Level of consciousness: awake and alert and oriented Pain management: pain level controlled Vital Signs Assessment: post-procedure vital signs reviewed and stable Respiratory status: spontaneous breathing, nonlabored ventilation and respiratory function stable Cardiovascular status: blood pressure returned to baseline and stable Postop Assessment: no signs of nausea or vomiting Anesthetic complications: no     Last Vitals:  Vitals:   06/12/17 1118 06/12/17 1134  BP: 113/75 129/83  Pulse: 66 75  Resp: (!) 41 20  Temp: 36.4 C 36.7 C    Last Pain:  Vitals:   06/12/17 1134  TempSrc: Tympanic  PainSc:                  Susana Duell

## 2017-06-13 ENCOUNTER — Encounter: Payer: Self-pay | Admitting: General Surgery

## 2017-06-20 ENCOUNTER — Ambulatory Visit (INDEPENDENT_AMBULATORY_CARE_PROVIDER_SITE_OTHER): Payer: BLUE CROSS/BLUE SHIELD | Admitting: General Surgery

## 2017-06-20 ENCOUNTER — Encounter: Payer: Self-pay | Admitting: General Surgery

## 2017-06-20 ENCOUNTER — Ambulatory Visit: Payer: BLUE CROSS/BLUE SHIELD | Admitting: Neurology

## 2017-06-20 VITALS — BP 130/70 | HR 80 | Resp 12 | Ht 69.0 in | Wt 265.0 lb

## 2017-06-20 DIAGNOSIS — K429 Umbilical hernia without obstruction or gangrene: Secondary | ICD-10-CM

## 2017-06-20 NOTE — Progress Notes (Signed)
[name], male   DOB: [birth year], 51 y.o.   MRN: [id number]  Chief Complaint  Patient presents with  . Routine Post Op    HPI Johnny Rios is a 51 y.o. male here today for his post op umbilical hernia repair done on 06/12/2017. Patient states he is doing well.  HPI  Past Medical History:  Diagnosis Date  . Anxiety   . Arthritis   . GERD (gastroesophageal reflux disease)   . History of kidney stones   . Sleep apnea    Uses CPAP    Past Surgical History:  Procedure Laterality Date  . APPENDECTOMY    . CHOLECYSTECTOMY N/A 01/19/2017   Procedure: LAPAROSCOPIC CHOLECYSTECTOMY WITH INTRAOPERATIVE CHOLANGIOGRAM;  Surgeon: Robert Bellow, MD;  Location: [facility] ORS;  Service: General;  Laterality: N/A;  . TONSILLECTOMY    . UMBILICAL HERNIA REPAIR N/A 06/12/2017   Ventral hernia with 6 cm Ventralex ST mesh.    Family History  Problem Relation Age of Onset  . Cancer Father        lung  . Alzheimer's disease Mother     Social History Social History  Substance Use Topics  . Smoking status: Never Smoker  . Smokeless tobacco: Former Systems developer    Types: Chew  . Alcohol use Yes     Comment: occasional    No Known Allergies  Current Outpatient Prescriptions  Medication Sig Dispense Refill  . acetaminophen (TYLENOL) 500 MG tablet Take 1,000 mg by mouth every 6 (six) hours as needed (for pain).    . clonazePAM (KLONOPIN) 1 MG tablet Take 1-2 tablets (1-2 mg total) by mouth daily as needed for anxiety. 45 tablet 2  . esomeprazole (NEXIUM) 20 MG capsule Take 1 capsule (20 mg total) by mouth every morning. (Patient taking differently: Take 20 mg by mouth daily before breakfast. ) 90 capsule 4  . omeprazole (PRILOSEC) 40 MG capsule TAKE 1 CAPSULE BY MOUTH DAILY 30 capsule 3   No current facility-administered medications for this visit.     Review of Systems Review of Systems  Constitutional: Negative.   Respiratory: Negative.   Cardiovascular: Negative.      Blood pressure 130/70, pulse 80, resp. rate 12, height 5\' 9"  (1.753 m), weight 265 lb (120.2 kg).  Physical Exam Physical Exam  Constitutional: He is oriented to person, place, and time. He appears well-developed and well-nourished.  Abdominal: Soft. Normal appearance and bowel sounds are normal. There is no tenderness.    Umbilical hernia is intact and healing well.   Neurological: He is alert and oriented to person, place, and time.  Skin: Skin is warm.       Assessment    Doing well status post repair of ventral hernia previous gallbladder extraction site.    Plan      Patient to return in one month.  Proper lifting techniques reviewed.The patient is aware to call back for any questions or concerns.    HPI, Physical Exam, Assessment and Plan have been scribed under the direction and in the presence of Hervey Ard, MD.  [name], CMA  I have completed the exam and reviewed the above documentation for accuracy and completeness.  I agree with the above.  Haematologist has been used and any errors in dictation or transcription are unintentional.  Hervey Ard, M.D., F.A.C.S.  Robert Bellow 06/20/2017, 9:54 PM

## 2017-06-20 NOTE — Patient Instructions (Addendum)
Return in one month.   Proper lifting techniques reviewed.The patient is aware to call back for any questions or concerns.

## 2017-06-29 ENCOUNTER — Telehealth: Payer: Self-pay | Admitting: *Deleted

## 2017-06-29 NOTE — Telephone Encounter (Signed)
Patient called and had umbilical hernia surgery by Dr.Byrnett on 06/12/17 and he is going to be starting work soon and he wants to know what he can and cannot do.

## 2017-06-29 NOTE — Telephone Encounter (Signed)
I talked with the patient. 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  He states he does not plan on lifting over 25 pounds until after his next visit with Dr Bary Castilla.

## 2017-07-13 ENCOUNTER — Encounter: Payer: Self-pay | Admitting: Neurology

## 2017-07-13 ENCOUNTER — Ambulatory Visit (INDEPENDENT_AMBULATORY_CARE_PROVIDER_SITE_OTHER): Payer: BLUE CROSS/BLUE SHIELD | Admitting: Neurology

## 2017-07-13 ENCOUNTER — Encounter (INDEPENDENT_AMBULATORY_CARE_PROVIDER_SITE_OTHER): Payer: Self-pay

## 2017-07-13 VITALS — BP 132/83 | HR 87 | Ht 70.0 in | Wt 265.0 lb

## 2017-07-13 DIAGNOSIS — Z9989 Dependence on other enabling machines and devices: Secondary | ICD-10-CM

## 2017-07-13 DIAGNOSIS — G4733 Obstructive sleep apnea (adult) (pediatric): Secondary | ICD-10-CM | POA: Diagnosis not present

## 2017-07-13 NOTE — Progress Notes (Signed)
Subjective:    [name] is a 51 y.o. male.  HPI     Interim history:  Johnny Rios is a 51 year old right-handed gentleman with an underlying medical history of kidney stones, reflux disease, peptic ulcer disease, arthritis, anxiety, status post recent laparoscopic cholecystectomy on 01/19/2017, and obesity, who presents for follow-up consultation of his obstructive sleep apnea, after recent home sleep testing and starting AutoPap therapy at home. The patient is accompanied by his wife today. I first met him on 03/09/2017 at the request of his primary care physician, at which time the patient reported snoring and severe daytime somnolence as well as apneic pauses while asleep. I invited him for a sleep study. His insurance denied and attended sleep study. He had a home sleep test on 03/22/2017 indicating severe obstructive sleep apnea with an AHI of 52.9 per hour, O2 nadir of 65%, time below 88% saturation of 209 minutes. His insurance, despite severe day of his sleep disordered breathing denied an attended CPAP titration study. I placed him on AutoPap therapy at home.  Today, 07/13/2017 (all dictated new, as well as above notes, some dictation done in note pad or Word, outside of chart, may appear as copied):  I reviewed his AutoPap compliance data from 06/12/2017 through 07/11/2017, which is a total of 30 days, during which time he used his machine 25 days with percent used days greater than 4 hours at 70%, indicating adequate compliance with an average usage of 5 hours and 27 minutes, residual AHI 3.4 per hour, 95th percentile of pressure at 12.9 cm, leak, high side with the 95th percentile at 23.8 L/m, pressure setting of 5-13 cm. In the past 90 days his compliance percentage was 79%. He reports doing a little better. He feels less tired during the day, his wife reports that he tends to take less naps during the day. He does have the occasional skipped night because of recent  issues, one time he had nausea, one time he had nasal congestion and one time he went on an overnight trip did not take his machine with him. Generally speaking, he feels improved and is motivated to continue with treatment. He is trying to watch what he eats and trying to lose weight but weight has plateaued. He drinks sweet tea mostly during the day, some water.  The patient's allergies, current medications, family history, past medical history, past social history, past surgical history and problem list were reviewed and updated as appropriate.   Previously (copied from previous notes for reference):   03/09/2017: (He) reports snoring and severe/excessive daytime somnolence. I reviewed your office note from 10/31/2016. His Epworth sleepiness score is 22 out of 24 today, fatigue score is 58 out of 63. His girlfriend reports apneic pauses while he is asleep. He is the youngest of 9 total siblings and one brother has OSA, on CPAP. He has 2 children, 15 year old daughter, who has a nearly 49-year-old child, and he has a 32-year-old son whom he sees on weekends. He feels that he does not have the energy to spend time with him playing or being active as much  as he would like. He denies morning headaches, he has nocturia about once per night on average, denies restless leg symptoms or leg twitching at night. He is a nonsmoker. He drinks alcohol rarely. He drinks caffeine, usually several cups of tea during the day and a cup of coffee at night. He does not have trouble going to sleep or staying  asleep. Bedtime and wake time very. His mother lived to be 63 years old. He believes she was tested with a sleep study and had OSA. His father died at 58 with cancer. His mother had dementia. He has had sleep-related symptoms for years. He wakes himself up choking and gasping for air. His girlfriend reports loud snoring and frequent witnessed apneic pauses while he is asleep. 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   His Past Medical History Is Significant For: Past Medical History:  Diagnosis Date  . Anxiety   . Arthritis   . GERD (gastroesophageal reflux disease)   . History of kidney stones   . Sleep apnea    Uses CPAP    His Past Surgical History Is Significant For: Past Surgical History:  Procedure Laterality Date  . APPENDECTOMY    . CHOLECYSTECTOMY N/A 01/19/2017   Procedure: LAPAROSCOPIC CHOLECYSTECTOMY WITH INTRAOPERATIVE CHOLANGIOGRAM;  Surgeon: Earline Mayotte, MD;  Location: [facility] ORS;  Service: General;  Laterality: N/A;  . TONSILLECTOMY    . UMBILICAL HERNIA REPAIR N/A 06/12/2017   Ventral hernia with 6 cm Ventralex ST mesh.    His Family History Is Significant For: Family History  Problem Relation Age of Onset  . Cancer Father        lung  . Alzheimer's disease Mother     His Social History Is Significant For: Social History   Social History  . Marital status: Significant Other    Spouse name: N/A  . Number of children: 2  . Years of education: HS   Social History Main Topics  . Smoking status: Never Smoker  . Smokeless tobacco: Former Neurosurgeon    Types: Chew  . Alcohol use Yes     Comment: occasional  . Drug use: No  . Sexual activity: Not Asked   Other Topics Concern  . None   Social History Narrative   Drinks 3-4 caffeine drinks a day     His Allergies Are:  No Known Allergies:   His Current Medications Are:  Outpatient Encounter Prescriptions as of 07/13/2017  Medication Sig  . clonazePAM (KLONOPIN) 1 MG tablet Take 1-2 tablets (1-2 mg total) by mouth daily as needed for anxiety.  Marland Kitchen esomeprazole (NEXIUM) 20 MG capsule Take 1 capsule (20 mg total) by mouth every morning. (Patient taking differently: Take 20 mg by mouth daily before breakfast. )  . [DISCONTINUED] acetaminophen (TYLENOL) 500 MG tablet Take 1,000 mg by mouth every 6 (six) hours as needed (for pain).  . [DISCONTINUED] omeprazole (PRILOSEC) 40 MG capsule TAKE 1 CAPSULE BY  MOUTH DAILY   No facility-administered encounter medications on file as of 07/13/2017.   :  Review of Systems:  Out of a complete 14 point review of systems, all are reviewed and negative with the exception of these symptoms as listed below:  Review of Systems  Neurological:       Pt presents today to discuss his cpap. Pt states that he feels much better during the day now that he has started his cpap. Pt is using [employer].    Objective:  Neurological Exam  Physical Exam Physical Examination:   Vitals:   07/13/17 1144  BP: 132/83  Pulse: 87   General Examination: The patient is a very pleasant 51 y.o. male in no acute distress. He appears well-developed and well-nourished and adequately groomed.   HEENT: Normocephalic, atraumatic, pupils are equal, round and reactive to light and accommodation. Funduscopic exam is normal  with sharp disc margins noted. Extraocular tracking is good without limitation to gaze excursion or nystagmus noted. Normal smooth pursuit is noted. Hearing is grossly intact. Face is symmetric with normal facial animation and normal facial sensation. Speech is clear with no dysarthria noted. There is no hypophonia. There is no lip, neck/head, jaw or voice tremor. Neck is supple with full range of passive and active motion. Oropharynx exam reveals: mild to moderate mouth dryness, and marked airway crowding. No nasal sores. Mallampati is class II. Tongue protrudes centrally and palate elevates symmetrically. Tonsils are absent. .   Chest: Clear to auscultation without wheezing, rhonchi or crackles noted.  Heart: S1+S2+0, regular and normal without murmurs, rubs or gallops noted.   Abdomen: Soft, non-tender and non-distended with normal bowel sounds appreciated on auscultation.  Extremities: There is no edema in the distal lower extremities bilaterally.   Skin: Warm and dry without trophic changes noted.  Musculoskeletal: exam reveals no obvious joint  deformities, tenderness or joint swelling or erythema.   Neurologically:  Mental status: The patient is awake, alert and oriented in all 4 spheres. His immediate and remote memory, attention, language skills and fund of knowledge are appropriate. There is no evidence of aphasia, agnosia, apraxia or anomia. Speech is clear with normal prosody and enunciation. Thought process is linear. Mood is normal and affect is normal.  Cranial nerves II - XII are as described above under HEENT exam. In addition: shoulder shrug is normal with equal shoulder height noted. Motor exam: Normal bulk, strength and tone is noted. There is no drift, tremor or rebound. Romberg is negative. Reflexes are 1-2+ throughout. Fine motor skills and coordination: grossly intact.  Cerebellar testing: No dysmetria or intention tremor on finger to nose testing. There is no truncal or gait ataxia.  Sensory exam: intact to light touch in the upper and lower extremities.  Gait, station and balance: He stands easily. No veering to one side is noted. No leaning to one side is noted. Posture is age-appropriate and stance is narrow based. Gait shows normal stride length and normal pace. No problems turning are noted. Tandem walk is unremarkable.                Assessment and Plan:  In summary, HY SWIATEK is a very pleasant 51 year old male with an underlying medical history of kidney stones, reflux disease, peptic ulcer disease, arthritis, anxiety, status post recent laparoscopic cholecystectomy on 01/19/2017, and obesity, who presents for Follow-up consultation of his severe obstructive sleep apnea as determined by a home sleep test. He has been on AutoPap therapy. He is adequate with his compliance but was a little better when he first started and the first 6 weeks looks better with regards to his compliance more closer to 80% compliance versus 70% currently. He is encouraged to be fully compliant with treatment. He endorses improvement  with respect to his sleep quality and daytime somnolence. He is advised about his home sleep test results as well as compliance data for the past 30 days and 90 days. Physical exam is stable. He is encouraged to work on weight loss and discuss with his primary care physician ways to work on weight loss and perhaps to see a nutritionist if appropriate. I did suggest we proceed with an overnight pulse oximetry test to monitor his oxygen level I night when he is using his AutoPap. He did have significant desaturations as noted in his home sleep test. We will call him with his  ONO results. I suggested a 6 month follow-up, sooner as needed.  I answered all their questions today and the patient and his wife were in agreement.  I spent 20 minutes in total face-to-face time with the patient, more than 50% of which was spent in counseling and coordination of care, reviewing test results, reviewing medication and discussing or reviewing the diagnosis of OSA, its prognosis and treatment options. Pertinent laboratory and imaging test results that were available during this visit with the patient were reviewed by me and considered in my medical decision making (see chart for details).

## 2017-07-13 NOTE — Patient Instructions (Addendum)
Please continue using your autoPAP regularly. While your insurance requires that you use PAP at least 4 hours each night on 70% of the nights, I recommend, that you not skip any nights and use it throughout the night if you can. Getting used to PAP and staying with the treatment long term does take time and patience and discipline. Untreated obstructive sleep apnea when it is moderate to severe can have an adverse impact on cardiovascular health and raise her risk for heart disease, arrhythmias, hypertension, congestive heart failure, stroke and diabetes. Untreated obstructive sleep apnea causes sleep disruption, nonrestorative sleep, and sleep deprivation. This can have an impact on your day to day functioning and cause daytime sleepiness and impairment of cognitive function, memory loss, mood disturbance, and problems focussing. Using PAP regularly can improve these symptoms. Your download shows good results. Please try not to skip any nights.  Drink more water.  We will do an overnight oxygen level test, called ONO, and your DME company, [employer], will call and set this up for one night, while you also use your AutoPAP machine. We will call you with the results.  Follow up in 6 months with one of our NPs.

## 2017-07-18 ENCOUNTER — Ambulatory Visit (INDEPENDENT_AMBULATORY_CARE_PROVIDER_SITE_OTHER): Payer: BLUE CROSS/BLUE SHIELD | Admitting: General Surgery

## 2017-07-18 ENCOUNTER — Encounter: Payer: Self-pay | Admitting: General Surgery

## 2017-07-18 VITALS — BP 136/86 | HR 78 | Resp 12 | Ht 70.0 in | Wt 267.0 lb

## 2017-07-18 DIAGNOSIS — K429 Umbilical hernia without obstruction or gangrene: Secondary | ICD-10-CM

## 2017-07-18 NOTE — Patient Instructions (Addendum)
The patient is aware to call back for any questions or concerns. Resume activities as tolerated.  

## 2017-07-18 NOTE — Progress Notes (Signed)
[name], male   DOB: 03-27-1966, 51 y.o.   MRN: [id number]  Chief Complaint  Patient presents with  . Routine Post Op    HPI Johnny Rios is a 51 y.o. male.  Here today for postoperative visit, he states he is doing well. Denies any gastrointestinal issues, bowels are moving regular. Mild burning at umbilical area.  HPI  Past Medical History:  Diagnosis Date  . Anxiety   . Arthritis   . GERD (gastroesophageal reflux disease)   . History of kidney stones   . Sleep apnea    Uses CPAP    Past Surgical History:  Procedure Laterality Date  . APPENDECTOMY    . CHOLECYSTECTOMY N/A 01/19/2017   Procedure: LAPAROSCOPIC CHOLECYSTECTOMY WITH INTRAOPERATIVE CHOLANGIOGRAM;  Surgeon: Robert Bellow, MD;  Location: [facility] ORS;  Service: General;  Laterality: N/A;  . TONSILLECTOMY    . UMBILICAL HERNIA REPAIR N/A 06/12/2017   Ventral hernia with 6 cm Ventralex ST mesh.    Family History  Problem Relation Age of Onset  . Cancer Father        lung  . Alzheimer's disease Mother     Social History Social History  Substance Use Topics  . Smoking status: Never Smoker  . Smokeless tobacco: Former Systems developer    Types: Chew  . Alcohol use Yes     Comment: occasional    No Known Allergies  Current Outpatient Prescriptions  Medication Sig Dispense Refill  . clonazePAM (KLONOPIN) 1 MG tablet Take 1-2 tablets (1-2 mg total) by mouth daily as needed for anxiety. 45 tablet 2  . esomeprazole (NEXIUM) 20 MG capsule Take 1 capsule (20 mg total) by mouth every morning. (Patient taking differently: Take 20 mg by mouth daily before breakfast. ) 90 capsule 4   No current facility-administered medications for this visit.     Review of Systems Review of Systems  Constitutional: Negative.   Respiratory: Negative.   Cardiovascular: Negative.     Blood pressure 136/86, pulse 78, resp. rate 12, height 5\' 10"  (1.778 m), weight 267 lb (121.1 kg).  Physical Exam Physical Exam   Constitutional: He is oriented to person, place, and time. He appears well-developed and well-nourished.  Abdominal: Soft. Normal appearance. There is no tenderness.    Repair intact  Neurological: He is alert and oriented to person, place, and time.  Skin: Skin is warm and dry.  Psychiatric: His behavior is normal.      Assessment    Doing well status post umbilical hernia repair with 6.4 cm mesh reinforcement.    Plan         Follow up as needed. Proper lifting techniques reviewed. Resume activities as tolerated.   HPI, Physical Exam, Assessment and Plan have been scribed under the direction and in the presence of Robert Bellow, MD. Karie Fetch, RN  I have completed the exam and reviewed the above documentation for accuracy and completeness.  I agree with the above.  Haematologist has been used and any errors in dictation or transcription are unintentional.  Hervey Ard, M.D., F.A.C.S.  Robert Bellow 07/18/2017, 12:19 PM

## 2017-07-26 ENCOUNTER — Encounter: Payer: Self-pay | Admitting: Family Medicine

## 2017-07-26 ENCOUNTER — Ambulatory Visit (INDEPENDENT_AMBULATORY_CARE_PROVIDER_SITE_OTHER): Payer: BLUE CROSS/BLUE SHIELD | Admitting: Family Medicine

## 2017-07-26 VITALS — BP 143/92 | HR 88 | Ht 70.87 in | Wt 271.0 lb

## 2017-07-26 DIAGNOSIS — Z131 Encounter for screening for diabetes mellitus: Secondary | ICD-10-CM

## 2017-07-26 DIAGNOSIS — F419 Anxiety disorder, unspecified: Secondary | ICD-10-CM | POA: Diagnosis not present

## 2017-07-26 DIAGNOSIS — G4730 Sleep apnea, unspecified: Secondary | ICD-10-CM | POA: Diagnosis not present

## 2017-07-26 DIAGNOSIS — Z13220 Encounter for screening for lipoid disorders: Secondary | ICD-10-CM

## 2017-07-26 DIAGNOSIS — R079 Chest pain, unspecified: Secondary | ICD-10-CM | POA: Insufficient documentation

## 2017-07-26 DIAGNOSIS — Z0000 Encounter for general adult medical examination without abnormal findings: Secondary | ICD-10-CM

## 2017-07-26 DIAGNOSIS — R5383 Other fatigue: Secondary | ICD-10-CM | POA: Diagnosis not present

## 2017-07-26 DIAGNOSIS — Z1329 Encounter for screening for other suspected endocrine disorder: Secondary | ICD-10-CM | POA: Diagnosis not present

## 2017-07-26 DIAGNOSIS — Z125 Encounter for screening for malignant neoplasm of prostate: Secondary | ICD-10-CM

## 2017-07-26 DIAGNOSIS — R5382 Chronic fatigue, unspecified: Secondary | ICD-10-CM | POA: Insufficient documentation

## 2017-07-26 LAB — URINALYSIS, ROUTINE W REFLEX MICROSCOPIC
Bilirubin, UA: NEGATIVE
GLUCOSE, UA: NEGATIVE
Ketones, UA: NEGATIVE
Leukocytes, UA: NEGATIVE
NITRITE UA: NEGATIVE
Protein, UA: NEGATIVE
RBC, UA: NEGATIVE
Specific Gravity, UA: 1.02 (ref 1.005–1.030)
UUROB: 1 mg/dL (ref 0.2–1.0)
pH, UA: 7 (ref 5.0–7.5)

## 2017-07-26 LAB — MICROSCOPIC EXAMINATION
Bacteria, UA: NONE SEEN
RBC MICROSCOPIC, UA: NONE SEEN /HPF (ref 0–?)
WBC UA: NONE SEEN /HPF (ref 0–?)

## 2017-07-26 MED ORDER — CLONAZEPAM 1 MG PO TABS: 1.0000 mg | ORAL_TABLET | Freq: Every day | ORAL | 2 refills | Status: DC | PRN

## 2017-07-26 NOTE — Assessment & Plan Note (Signed)
Doing well with's CPAP but has overnight oximetry results pending and being followed.

## 2017-07-26 NOTE — Assessment & Plan Note (Signed)
Nonspecific chest pain in patient with multiple risk factors of elevated blood pressure high cholesterol overweight and strongly positive family history will refer to cardiology to further evaluate.

## 2017-07-26 NOTE — Assessment & Plan Note (Signed)
Discussed diet exercise nutrition for weight loss discussed various dieting types and indications.

## 2017-07-26 NOTE — Assessment & Plan Note (Signed)
For fatigue discuss weight loss will also check testosterone level and other labs.

## 2017-07-26 NOTE — Progress Notes (Signed)
BP (!) 143/92   Pulse 88   Ht 5' 10.87" (1.8 m)   Wt 271 lb (122.9 kg)   SpO2 96%   BMI 37.94 kg/m    Subjective:    [name], male    DOB: 11/07/1966, 51 y.o.   MRN: [id number]  HPI: Johnny Rios is a 51 y.o. male  Chief Complaint  Patient presents with  . Annual Exam  . Fatigue  . Obesity  Patient with multiple concerns starting with weight gain over the last several years of 16 pounds. Patient has not increased his eating at all and is actually eating maybe a little less as tried to cut out some sugars but still has gained 6 pounds in the last year. Concerned about diabetes as diabetes runs in his family. Concerned about possibility of coronary artery disease as has some left chest radiating to his left arm symptoms occasionally associated with exertion and not associated with diaphoresis nausea vomiting. Goes away pretty quickly with rest. Also concerned about testosterone levels will check today. For sleep apnea patient was doing really well with CPAP machine but not as well recently had last night and pulse oximetry overnight results are pending. Uses clonazepam once a day with an extra occasionally and feels edgy. Otherwise doing all right.  Relevant past medical, surgical, family and social history reviewed and updated as indicated. Interim medical history since our last visit reviewed. Allergies and medications reviewed and updated.  Review of Systems  Constitutional: Negative.   HENT: Negative.   Eyes: Negative.   Respiratory: Negative.   Cardiovascular: Negative.   Gastrointestinal: Negative.   Endocrine: Negative.   Genitourinary: Negative.   Musculoskeletal: Negative.        Patient has occasional especially with walking a lot back pain and legs will occasionally get numb.  Skin: Negative.   Allergic/Immunologic: Negative.   Neurological: Negative.   Hematological: Negative.   Psychiatric/Behavioral: Negative.     Per HPI unless  specifically indicated above     Objective:    BP (!) 143/92   Pulse 88   Ht 5' 10.87" (1.8 m)   Wt 271 lb (122.9 kg)   SpO2 96%   BMI 37.94 kg/m   Wt Readings from Last 3 Encounters:  07/26/17 271 lb (122.9 kg)  07/18/17 267 lb (121.1 kg)  07/13/17 265 lb (120.2 kg)    Physical Exam  Constitutional: He is oriented to person, place, and time. He appears well-developed and well-nourished.  HENT:  Head: Normocephalic.  Right Ear: External ear normal.  Left Ear: External ear normal.  Nose: Nose normal.  Eyes: Pupils are equal, round, and reactive to light. Conjunctivae and EOM are normal.  Neck: Normal range of motion. Neck supple. No thyromegaly present.  Cardiovascular: Normal rate, regular rhythm, normal heart sounds and intact distal pulses.   Pulmonary/Chest: Effort normal and breath sounds normal.  Abdominal: Soft. Bowel sounds are normal. There is no splenomegaly or hepatomegaly.  Genitourinary: Penis normal.  Genitourinary Comments: Prostate refused  Musculoskeletal: Normal range of motion.  Lymphadenopathy:    He has no cervical adenopathy.  Neurological: He is alert and oriented to person, place, and time. He has normal reflexes.  Skin: Skin is warm and dry.  Psychiatric: He has a normal mood and affect. His behavior is normal. Judgment and thought content normal.   EKG normal sinus rhythm and normal. Computer read differently but normal EKG.     Assessment & Plan:  Problem List Items Addressed This Visit      Respiratory   Sleep apnea    Doing well with's CPAP but has overnight oximetry results pending and being followed.        Other   Anxiety   Relevant Medications   clonazePAM (KLONOPIN) 1 MG tablet   Chest pain    Nonspecific chest pain in patient with multiple risk factors of elevated blood pressure high cholesterol overweight and strongly positive family history will refer to cardiology to further evaluate.      Relevant Orders   EKG 12-Lead  (Completed)   Ambulatory referral to Cardiology   Fatigue    For fatigue discuss weight loss will also check testosterone level and other labs.      Relevant Orders   Testosterone    Other Visit Diagnoses    Annual physical exam    -  Primary   Relevant Orders   CBC with Differential/Platelet   Screening for diabetes mellitus (DM)       Relevant Orders   Comprehensive metabolic panel   Urinalysis, Routine w reflex microscopic   Screening cholesterol level       Relevant Orders   Lipid panel   Prostate cancer screening       Relevant Orders   PSA   Thyroid disorder screen       Relevant Orders   TSH       Follow up plan: Return in about 2 months (around 09/25/2017), or if symptoms worsen or fail to improve, for f/u mult issues.

## 2017-07-27 ENCOUNTER — Telehealth: Payer: Self-pay | Admitting: Family Medicine

## 2017-07-27 DIAGNOSIS — R7989 Other specified abnormal findings of blood chemistry: Secondary | ICD-10-CM

## 2017-07-27 DIAGNOSIS — D649 Anemia, unspecified: Secondary | ICD-10-CM

## 2017-07-27 LAB — CBC WITH DIFFERENTIAL/PLATELET
Basophils Absolute: 0 10*3/uL (ref 0.0–0.2)
Basos: 1 %
EOS (ABSOLUTE): 0.2 10*3/uL (ref 0.0–0.4)
EOS: 4 %
HEMATOCRIT: 38.7 % (ref 37.5–51.0)
HEMOGLOBIN: 12.6 g/dL — AB (ref 13.0–17.7)
Immature Grans (Abs): 0 10*3/uL (ref 0.0–0.1)
Immature Granulocytes: 0 %
LYMPHS ABS: 2.3 10*3/uL (ref 0.7–3.1)
Lymphs: 36 %
MCH: 24.6 pg — AB (ref 26.6–33.0)
MCHC: 32.6 g/dL (ref 31.5–35.7)
MCV: 75 fL — ABNORMAL LOW (ref 79–97)
MONOCYTES: 7 %
MONOS ABS: 0.4 10*3/uL (ref 0.1–0.9)
NEUTROS ABS: 3.3 10*3/uL (ref 1.4–7.0)
Neutrophils: 52 %
Platelets: 313 10*3/uL (ref 150–379)
RBC: 5.13 x10E6/uL (ref 4.14–5.80)
RDW: 14.3 % (ref 12.3–15.4)
WBC: 6.3 10*3/uL (ref 3.4–10.8)

## 2017-07-27 LAB — COMPREHENSIVE METABOLIC PANEL
A/G RATIO: 1.5 (ref 1.2–2.2)
ALBUMIN: 4.1 g/dL (ref 3.5–5.5)
ALK PHOS: 117 IU/L (ref 39–117)
ALT: 49 IU/L — ABNORMAL HIGH (ref 0–44)
AST: 32 IU/L (ref 0–40)
BILIRUBIN TOTAL: 0.4 mg/dL (ref 0.0–1.2)
BUN / CREAT RATIO: 10 (ref 9–20)
BUN: 10 mg/dL (ref 6–24)
CHLORIDE: 103 mmol/L (ref 96–106)
CO2: 22 mmol/L (ref 20–29)
Calcium: 9.6 mg/dL (ref 8.7–10.2)
Creatinine, Ser: 1.03 mg/dL (ref 0.76–1.27)
GFR calc Af Amer: 98 mL/min/{1.73_m2} (ref >59)
GFR calc non Af Amer: 85 mL/min/{1.73_m2} (ref >59)
GLOBULIN, TOTAL: 2.7 g/dL (ref 1.5–4.5)
Glucose: 114 mg/dL — ABNORMAL HIGH (ref 65–99)
POTASSIUM: 4.6 mmol/L (ref 3.5–5.2)
SODIUM: 141 mmol/L (ref 134–144)
Total Protein: 6.8 g/dL (ref 6.0–8.5)

## 2017-07-27 LAB — LIPID PANEL
CHOLESTEROL TOTAL: 219 mg/dL — AB (ref 100–199)
Chol/HDL Ratio: 7.8 ratio — ABNORMAL HIGH (ref 0.0–5.0)
HDL: 28 mg/dL — ABNORMAL LOW (ref >39)
LDL Calculated: 132 mg/dL — ABNORMAL HIGH (ref 0–99)
Triglycerides: 293 mg/dL — ABNORMAL HIGH (ref 0–149)
VLDL Cholesterol Cal: 59 mg/dL — ABNORMAL HIGH (ref 5–40)

## 2017-07-27 LAB — TSH: TSH: 1.65 u[IU]/mL (ref 0.450–4.500)

## 2017-07-27 LAB — PSA: Prostate Specific Ag, Serum: 2 ng/mL (ref 0.0–4.0)

## 2017-07-27 LAB — TESTOSTERONE: TESTOSTERONE: 203 ng/dL — AB (ref 264–916)

## 2017-07-27 NOTE — Telephone Encounter (Signed)
Phone call Discussed with patient hemoglobin slightly low patient's been through 2 surgeries since last drawn possibly explaining symptoms patient will take iron with vitamins and recheck CBC 1 month with office visit. At same time will check testosterone. This would be his second testosterone to assess to see if it's low again.

## 2017-08-01 ENCOUNTER — Telehealth: Payer: Self-pay | Admitting: Neurology

## 2017-08-01 NOTE — Telephone Encounter (Signed)
I reviewed patient's overnight pulse oximetry test from 07/26/2017. Duration of test was 5 hours and 38 minutes, patient was on his AutoPap on room air. Average oxygen saturation 93.5%, nadir was 81%, time below 88% saturation was 1 minute. It appears the patient is appropriately treated on AutoPap therapy only and does not qualify or require supplemental oxygen. Please call patient and remind him to continue with his AutoPap therapy and advise him that his recent overnight pulse oximetry test showed adequate oxygen saturations at night. He can follow-up as planned for sleep apnea recheck. thx

## 2017-08-02 NOTE — Telephone Encounter (Signed)
I called pt. I advised him that his ONO results show that he is appropriately treated on auto pap therapy and does not qualify for supplemental O2 and he should follow up as planned. Pt verbalized understanding of results. Pt had no questions at this time but was encouraged to call back if questions arise.

## 2017-08-21 ENCOUNTER — Telehealth: Payer: Self-pay | Admitting: Cardiovascular Disease

## 2017-08-21 NOTE — Telephone Encounter (Signed)
Patient having pain in R side back radiating to side and groin .

## 2017-08-21 NOTE — Telephone Encounter (Signed)
Ok to add on for same day slot this pm.

## 2017-08-21 NOTE — Telephone Encounter (Signed)
Patient declinded sooner appt today 8/27 at 420 .

## 2017-08-21 NOTE — Telephone Encounter (Signed)
Patient girl friend calling for sooner appt than 10/2 .  Patient only wants dr. Rockey Situ   Please advise

## 2017-09-24 ENCOUNTER — Observation Stay: Payer: BLUE CROSS/BLUE SHIELD

## 2017-09-24 ENCOUNTER — Emergency Department: Payer: BLUE CROSS/BLUE SHIELD

## 2017-09-24 ENCOUNTER — Encounter: Payer: Self-pay | Admitting: Emergency Medicine

## 2017-09-24 ENCOUNTER — Observation Stay
Admission: EM | Admit: 2017-09-24 | Discharge: 2017-09-25 | Disposition: A | Discharge status: Home / Self Care | Payer: BLUE CROSS/BLUE SHIELD | Attending: Internal Medicine | Admitting: Internal Medicine

## 2017-09-24 DIAGNOSIS — R0789 Other chest pain: Secondary | ICD-10-CM | POA: Diagnosis not present

## 2017-09-24 DIAGNOSIS — E785 Hyperlipidemia, unspecified: Secondary | ICD-10-CM | POA: Insufficient documentation

## 2017-09-24 DIAGNOSIS — R200 Anesthesia of skin: Secondary | ICD-10-CM | POA: Diagnosis present

## 2017-09-24 DIAGNOSIS — Z79899 Other long term (current) drug therapy: Secondary | ICD-10-CM | POA: Diagnosis not present

## 2017-09-24 DIAGNOSIS — F419 Anxiety disorder, unspecified: Secondary | ICD-10-CM | POA: Diagnosis not present

## 2017-09-24 DIAGNOSIS — Z8249 Family history of ischemic heart disease and other diseases of the circulatory system: Secondary | ICD-10-CM | POA: Diagnosis not present

## 2017-09-24 DIAGNOSIS — R739 Hyperglycemia, unspecified: Secondary | ICD-10-CM | POA: Insufficient documentation

## 2017-09-24 DIAGNOSIS — G4733 Obstructive sleep apnea (adult) (pediatric): Secondary | ICD-10-CM

## 2017-09-24 DIAGNOSIS — Z87891 Personal history of nicotine dependence: Secondary | ICD-10-CM | POA: Insufficient documentation

## 2017-09-24 DIAGNOSIS — R5382 Chronic fatigue, unspecified: Secondary | ICD-10-CM | POA: Diagnosis not present

## 2017-09-24 DIAGNOSIS — Z9989 Dependence on other enabling machines and devices: Secondary | ICD-10-CM

## 2017-09-24 DIAGNOSIS — M1990 Unspecified osteoarthritis, unspecified site: Secondary | ICD-10-CM | POA: Diagnosis not present

## 2017-09-24 DIAGNOSIS — Z7982 Long term (current) use of aspirin: Secondary | ICD-10-CM | POA: Diagnosis not present

## 2017-09-24 DIAGNOSIS — R079 Chest pain, unspecified: Secondary | ICD-10-CM | POA: Diagnosis present

## 2017-09-24 DIAGNOSIS — I639 Cerebral infarction, unspecified: Secondary | ICD-10-CM

## 2017-09-24 LAB — PROTIME-INR
INR: 0.96
PROTHROMBIN TIME: 12.7 s (ref 11.4–15.2)

## 2017-09-24 LAB — CBC
HCT: 38.2 % — ABNORMAL LOW (ref 40.0–52.0)
Hemoglobin: 12.9 g/dL — ABNORMAL LOW (ref 13.0–18.0)
MCH: 25.6 pg — ABNORMAL LOW (ref 26.0–34.0)
MCHC: 33.7 g/dL (ref 32.0–36.0)
MCV: 75.9 fL — ABNORMAL LOW (ref 80.0–100.0)
PLATELETS: 301 10*3/uL (ref 150–440)
RBC: 5.04 MIL/uL (ref 4.40–5.90)
RDW: 14.1 % (ref 11.5–14.5)
WBC: 9.1 10*3/uL (ref 3.8–10.6)

## 2017-09-24 LAB — COMPREHENSIVE METABOLIC PANEL
ALBUMIN: 3.9 g/dL (ref 3.5–5.0)
ALK PHOS: 109 U/L (ref 38–126)
ALT: 32 U/L (ref 17–63)
AST: 28 U/L (ref 15–41)
Anion gap: 9 (ref 5–15)
BUN: 13 mg/dL (ref 6–20)
CALCIUM: 9 mg/dL (ref 8.9–10.3)
CO2: 25 mmol/L (ref 22–32)
CREATININE: 1.07 mg/dL (ref 0.61–1.24)
Chloride: 103 mmol/L (ref 101–111)
GFR calc Af Amer: >60 mL/min (ref >60)
GFR calc non Af Amer: >60 mL/min (ref >60)
GLUCOSE: 118 mg/dL — AB (ref 65–99)
Potassium: 3.7 mmol/L (ref 3.5–5.1)
SODIUM: 137 mmol/L (ref 135–145)
Total Bilirubin: 0.3 mg/dL (ref 0.3–1.2)
Total Protein: 7.1 g/dL (ref 6.5–8.1)

## 2017-09-24 LAB — DIFFERENTIAL
Basophils Absolute: 0.1 10*3/uL (ref 0–0.1)
Basophils Relative: 1 %
Eosinophils Absolute: 0.2 10*3/uL (ref 0–0.7)
Eosinophils Relative: 3 %
LYMPHS PCT: 33 %
Lymphs Abs: 2.9 10*3/uL (ref 1.0–3.6)
Monocytes Absolute: 0.6 10*3/uL (ref 0.2–1.0)
Monocytes Relative: 7 %
NEUTROS ABS: 5.2 10*3/uL (ref 1.4–6.5)
NEUTROS PCT: 58 %

## 2017-09-24 LAB — APTT: aPTT: 30 seconds (ref 24–36)

## 2017-09-24 LAB — TROPONIN I: Troponin I: <0.03 ng/mL (ref <0.03)

## 2017-09-24 LAB — GLUCOSE, CAPILLARY: Glucose-Capillary: 115 mg/dL — ABNORMAL HIGH (ref 65–99)

## 2017-09-24 MED ORDER — ONDANSETRON HCL 4 MG/2ML IJ SOLN: 4.0000 mg | Freq: Four times a day (QID) | INTRAMUSCULAR | Status: DC | PRN

## 2017-09-24 MED ORDER — PANTOPRAZOLE SODIUM 40 MG PO TBEC
40.0000 mg | DELAYED_RELEASE_TABLET | Freq: Every day | ORAL | Status: DC
  Administered 2017-09-25: 08:00:00 40 mg via ORAL
  Filled 2017-09-24: qty 1

## 2017-09-24 MED ORDER — ASPIRIN EC 81 MG PO TBEC
81.0000 mg | DELAYED_RELEASE_TABLET | Freq: Every day | ORAL | Status: DC
  Administered 2017-09-24 – 2017-09-25 (×2): 81 mg via ORAL
  Filled 2017-09-24 (×2): qty 1

## 2017-09-24 MED ORDER — POLYETHYLENE GLYCOL 3350 17 G PO PACK: 17.0000 g | PACK | Freq: Every day | ORAL | Status: DC | PRN

## 2017-09-24 MED ORDER — ONDANSETRON HCL 4 MG PO TABS
4.0000 mg | ORAL_TABLET | Freq: Four times a day (QID) | ORAL | Status: DC | PRN
Start: 2017-09-24 — End: 2017-09-25

## 2017-09-24 MED ORDER — ENOXAPARIN SODIUM 40 MG/0.4ML ~~LOC~~ SOLN
40.0000 mg | SUBCUTANEOUS | Status: DC
  Administered 2017-09-24: 40 mg via SUBCUTANEOUS
  Filled 2017-09-24: qty 0.4

## 2017-09-24 MED ORDER — METOPROLOL TARTRATE 5 MG/5ML IV SOLN: 5.0000 mg | INTRAVENOUS | Status: DC | PRN

## 2017-09-24 MED ORDER — ACETAMINOPHEN 325 MG PO TABS: 650.0000 mg | ORAL_TABLET | Freq: Four times a day (QID) | ORAL | Status: DC | PRN

## 2017-09-24 MED ORDER — CLONAZEPAM 0.5 MG PO TABS
1.0000 mg | ORAL_TABLET | Freq: Every day | ORAL | Status: DC | PRN
  Administered 2017-09-25: 1 mg via ORAL
  Filled 2017-09-24: qty 2

## 2017-09-24 MED ORDER — ACETAMINOPHEN 650 MG RE SUPP: 650.0000 mg | Freq: Four times a day (QID) | RECTAL | Status: DC | PRN

## 2017-09-24 NOTE — H&P (Signed)
PCP:   Guadalupe Maple, MD   Chief Complaint:  Left sided numbness  HPI: This is a 51 year old gentleman who states he was getting ready to take his son out today when he felt lightheaded.later driving in car he developed left facial numbness and left arm numbness. This encompasses the entire left arm. He denies any drooling, dysphasia or  facial drooping. He states h's had a headache today. He denies any localized weakness. He states he's never had this before. It lasted for approximately an hour resolving in the ER. Additionally while this was happening he also developed left-sided chest pain. He was started under the arm and radiated forward. This lasted approximately 10-15 minutes. This chest pain was described as sharp and continuous. At its worse it was 5/10, currently 0/10. He denied any associated shortness of breath. He states was not reproducible. He denies any personal history of coronary artery disease but reports a significant family history of coronary artery disease including a nephew's 23 years old who's had AN mi AND BEEN  stented.  Review of Systems:  The patient denies anorexia, fever, weight loss,, vision loss, decreased hearing, hoarseness, chest pain, syncope, dyspnea on exertion, peripheral edema, balance deficits, hemoptysis, abdominal pain, melena, hematochezia, severe indigestion/heartburn, hematuria, incontinence, genital sores, numbness, muscle weakness, suspicious skin lesions, transient blindness, difficulty walking, depression, unusual weight change, abnormal bleeding, enlarged lymph nodes, angioedema, and breast masses.  Past Medical History: Past Medical History:  Diagnosis Date  . Anxiety   . Arthritis   . GERD (gastroesophageal reflux disease)   . History of kidney stones   . Sleep apnea    Uses CPAP   Past Surgical History:  Procedure Laterality Date  . APPENDECTOMY    . CHOLECYSTECTOMY N/A 01/19/2017   Procedure: LAPAROSCOPIC CHOLECYSTECTOMY WITH  INTRAOPERATIVE CHOLANGIOGRAM;  Surgeon: Robert Bellow, MD;  Location: [facility] ORS;  Service: General;  Laterality: N/A;  . TONSILLECTOMY    . UMBILICAL HERNIA REPAIR N/A 06/12/2017   Ventral hernia with 6 cm Ventralex ST mesh.    Medications: Prior to Admission medications   Medication Sig Start Date End Date Taking? Authorizing Provider  clonazePAM (KLONOPIN) 1 MG tablet Take 1-2 tablets (1-2 mg total) by mouth daily as needed for anxiety. 07/26/17  Yes Crissman, Jeannette How, MD  esomeprazole (NEXIUM) 20 MG capsule Take 1 capsule (20 mg total) by mouth every morning. Patient taking differently: Take 20 mg by mouth daily before breakfast.  04/25/17  Yes Crissman, Jeannette How, MD    Allergies:  No Known Allergies  Social History:  reports that he has never smoked. He has quit using smokeless tobacco. His smokeless tobacco use included Chew. He reports that he drinks alcohol. He reports that he does not use drugs.  Family History: Family History  Problem Relation Age of Onset  . Cancer Father        lung  . Alzheimer's disease Mother     Physical Exam: Vitals:   09/24/17 1810 09/24/17 1819 09/24/17 1916  BP: (!) 142/96    Pulse: 90    Resp: 18    Temp: 98.6 F (37 C)  98.7 F (37.1 C)  TempSrc: Oral    SpO2: 99%    Weight:  122.5 kg (270 lb)   Height:  5\' 9"  (1.753 m)     General:  Alert and oriented times three, well developed and nourished, no acute distress Eyes: PERRLA, pink conjunctiva, no scleral icterus ENT: Moist oral mucosa, neck supple, no  thyromegaly Lungs: clear to ascultation, no wheeze, no crackles, no use of accessory muscles Cardiovascular: regular rate and rhythm, no regurgitation, no gallops, no murmurs. No carotid bruits, no JVD Abdomen: soft, positive BS, non-tender, non-distended, no organomegaly, not an acute abdomen GU: not examined Neuro: CN II - XII grossly intact, sensation intact Musculoskeletal: strength 5/5 all extremities, no clubbing, cyanosis or  edema Skin: no rash, no subcutaneous crepitation, no decubitus Psych: appropriate patient   Labs on Admission:   Recent Labs  09/24/17 1824  NA 137  K 3.7  CL 103  CO2 25  GLUCOSE 118*  BUN 13  CREATININE 1.07  CALCIUM 9.0    Recent Labs  09/24/17 1824  AST 28  ALT 32  ALKPHOS 109  BILITOT 0.3  PROT 7.1  ALBUMIN 3.9   No results for input(s): LIPASE, AMYLASE in the last 72 hours.  Recent Labs  09/24/17 1824  WBC 9.1  NEUTROABS 5.2  HGB 12.9*  HCT 38.2*  MCV 75.9*  PLT 301    Recent Labs  09/24/17 1824  TROPONINI <0.03   Invalid input(s): POCBNP No results for input(s): DDIMER in the last 72 hours. No results for input(s): HGBA1C in the last 72 hours. No results for input(s): CHOL, HDL, LDLCALC, TRIG, CHOLHDL, LDLDIRECT in the last 72 hours. No results for input(s): TSH, T4TOTAL, T3FREE, THYROIDAB in the last 72 hours.  Invalid input(s): FREET3 No results for input(s): VITAMINB12, FOLATE, FERRITIN, TIBC, IRON, RETICCTPCT in the last 72 hours.  Micro Results: No results found for this or any previous visit (from the past 240 hour(s)).   Radiological Exams on Admission: Dg Chest Portable 1 View  Result Date: 09/24/2017 CLINICAL DATA:  Lightheadedness with left-sided chest pain and rib pain. EXAM: PORTABLE CHEST 1 VIEW COMPARISON:  07/12/2010 FINDINGS: Lungs are clear. Cardiomediastinal silhouette is within normal. There is mild degenerative change of the spine. IMPRESSION: No active disease. Electronically Signed   By: Marin Olp M.D.   On: 09/24/2017 19:18   Ct Head Code Stroke Wo Contrast  Result Date: 09/24/2017 CLINICAL DATA:  Code stroke.  Left facial and arm numbness EXAM: CT HEAD WITHOUT CONTRAST TECHNIQUE: Contiguous axial images were obtained from the base of the skull through the vertex without intravenous contrast. COMPARISON:  Brain MRI 10/15/2009 FINDINGS: Brain: No mass lesion or acute hemorrhage. No focal hypoattenuation of the basal  ganglia or cortex to indicate infarcted tissue. No hydrocephalus or age advanced atrophy. Vascular: No hyperdense vessel. No advanced atherosclerotic calcification of the arteries at the skull base. Skull: Normal visualized skull base, calvarium and extracranial soft tissues. Sinuses/Orbits: No sinus fluid levels or advanced mucosal thickening. No mastoid effusion. Normal orbits. ASPECTS [facility] Stroke Program Early CT Score) - Ganglionic level infarction (caudate, lentiform nuclei, internal capsule, insula, M1-M3 cortex): 7 - Supraganglionic infarction (M4-M6 cortex): 3 Total score (0-10 with 10 being normal): 10 IMPRESSION: 1. Normal head CT. 2. ASPECTS is 10. These results were called by telephone at the time of interpretation on 09/24/2017 at 6:25 pm to Dr. Cherylann Banas, who verbally acknowledged these results. Electronically Signed   By: Ulyses Jarred M.D.   On: 09/24/2017 18:25    Assessment/Plan Present on Admission: . Chest pain -Admit to MedSurg with cardiac monitoring -Aspirin daily, lipid panel in a.m. -Cycle cardiac enzymes, stress test in a.m.  Marland Kitchen Left sided numbness -Aspirin daily, neurochecks -MRI brain in a.m., carotid ultrasound, 2-D echo -Lipid panel in a.m.   Lynsay Fesperman 09/24/2017, 7:59 PM
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
## 2017-09-24 NOTE — ED Triage Notes (Signed)
Pt presents with left-sided facial numbness since 1745. Pt states he also had lightheadedness. He reports left sided chest/ribcage pain. Pt alert: & oriented, states that he is better than he was - numbness has lessened. Pt alert & oriented with NAD noted.

## 2017-09-24 NOTE — ED Notes (Signed)
Pt transported to room 104 

## 2017-09-24 NOTE — ED Notes (Signed)
Pt taken to CT.

## 2017-09-24 NOTE — ED Provider Notes (Addendum)
[facility] Emergency Department Provider Note   ____________________________________________   First MD Initiated Contact with Patient 09/24/17 1811     (approximate)  I have reviewed the triage vital signs and the nursing notes.   HISTORY  Chief Complaint Weakness    HPI Johnny Rios is a 51 y.o. male He reports he was driving his son and developed left-sided facial numbness and left arm numbness with some sharp stabbing chest pain and chest pain lasted about 10 minutes he was a little nauseated still is a little nauseated but had no shortness of breath or sweating or chest pain went away spontaneously and numbness is improved but is still there he never had any weakness that he can remember. He's never had it like this before. Tell neurology saw the patient was recommending admission for workup of strokelike symptoms   Past Medical History:  Diagnosis Date  . Anxiety   . Arthritis   . GERD (gastroesophageal reflux disease)   . History of kidney stones   . Sleep apnea    Uses CPAP    Patient Active Problem List   Diagnosis Date Noted  . Chest pain 07/26/2017  . Fatigue 07/26/2017  . Umbilical hernia without obstruction and without gangrene 06/05/2017  . Gallstones 01/04/2017  . PUD (peptic ulcer disease) 10/31/2016  . Obesity 12/02/2015  . Tachycardia 12/02/2015  . Sleep apnea 12/02/2015  . Anxiety     Past Surgical History:  Procedure Laterality Date  . APPENDECTOMY    . CHOLECYSTECTOMY N/A 01/19/2017   Procedure: LAPAROSCOPIC CHOLECYSTECTOMY WITH INTRAOPERATIVE CHOLANGIOGRAM;  Surgeon: Robert Bellow, MD;  Location: [facility] ORS;  Service: General;  Laterality: N/A;  . TONSILLECTOMY    . UMBILICAL HERNIA REPAIR N/A 06/12/2017   Ventral hernia with 6 cm Ventralex ST mesh.    Prior to Admission medications   Medication Sig Start Date End Date Taking? Authorizing Provider  clonazePAM (KLONOPIN) 1 MG tablet Take 1-2 tablets (1-2 mg  total) by mouth daily as needed for anxiety. 07/26/17   Guadalupe Maple, MD  esomeprazole (NEXIUM) 20 MG capsule Take 1 capsule (20 mg total) by mouth every morning. Patient taking differently: Take 20 mg by mouth daily before breakfast.  04/25/17   Guadalupe Maple, MD    Allergies Patient has no known allergies.  Family History  Problem Relation Age of Onset  . Cancer Father        lung  . Alzheimer's disease Mother     Social History Social History  Substance Use Topics  . Smoking status: Never Smoker  . Smokeless tobacco: Former Systems developer    Types: Chew  . Alcohol use Yes     Comment: occasional    Review of Systems  Constitutional: No fever/chills Eyes: No visual changes. ENT: No sore throat. Cardiovascular:see history of present illness Respiratory: Denies shortness of breath. Gastrointestinal: No abdominal pain.  No nausea, no vomiting.  No diarrhea.  No constipation. Genitourinary: Negative for dysuria. Musculoskeletal: Negative for back pain. Skin: Negative for rash. Neurological:see history of present illness  ____________________________________________   PHYSICAL EXAM:  VITAL SIGNS: ED Triage Vitals  Enc Vitals Group     BP 09/24/17 1810 (!) 142/96     Pulse Rate 09/24/17 1810 90     Resp 09/24/17 1810 18     Temp 09/24/17 1810 98.6 F (37 C)     Temp Source 09/24/17 1810 Oral     SpO2 09/24/17 1810 99 %  Weight 09/24/17 1819 270 lb (122.5 kg)     Height 09/24/17 1819 5\' 9"  (1.753 m)     Head Circumference --      Peak Flow --      Pain Score --      Pain Loc --      Pain Edu? --      Excl. in [place]? --     Constitutional: Alert and oriented. Well appearing and in no acute distress. Eyes: Conjunctivae are normal. PERRL. EOMI.fundi I'm very hard to see but what I do see looks normal Head: Atraumatic. Nose: No congestion/rhinnorhea. Mouth/Throat: Mucous membranes are moist.  Oropharynx non-erythematous. Neck: No stridor.   Cardiovascular: Normal  rate, regular rhythm. Grossly normal heart sounds.  Good peripheral circulation. Respiratory: Normal respiratory effort.  No retractions. Lungs CTAB. Gastrointestinal: Soft and nontender. No distention. No abdominal bruits. No CVA tenderness. Musculoskeletal: No lower extremity tenderness nor edema.  No joint effusions. Neurologic:  Normal speech and language.cranial nerves II through XII are intact visual fields were not checked cerebellar finger-nose rapid alternating movements and hands are normal motor strength is 5 over 5 throughout patient reports some numbness in the left arm and facial numbness is resolved. Skin:  Skin is warm, dry and intact. No rash noted. Psychiatric: Mood and affect are normal. Speech and behavior are normal.  ____________________________________________   LABS (all labs ordered are listed, but only abnormal results are displayed)  Labs Reviewed  CBC - Abnormal; Notable for the following:       Result Value   Hemoglobin 12.9 (*)    HCT 38.2 (*)    MCV 75.9 (*)    MCH 25.6 (*)    All other components within normal limits  COMPREHENSIVE METABOLIC PANEL - Abnormal; Notable for the following:    Glucose, Bld 118 (*)    All other components within normal limits  GLUCOSE, CAPILLARY - Abnormal; Notable for the following:    Glucose-Capillary 115 (*)    All other components within normal limits  PROTIME-INR  APTT  DIFFERENTIAL  TROPONIN I  CBG MONITORING, ED   ____________________________________________  EKG EKG read and interpreted by me shows normal sinus rhythm rate of 99 normal axis fused in 3 and F no other changes are seen looks similar to prior EKGs  ____________________________________________  RADIOLOGY  Ct Head Code Stroke Wo Contrast  Result Date: 09/24/2017 CLINICAL DATA:  Code stroke.  Left facial and arm numbness EXAM: CT HEAD WITHOUT CONTRAST TECHNIQUE: Contiguous axial images were obtained from the base of the skull through the vertex  without intravenous contrast. COMPARISON:  Brain MRI 10/15/2009 FINDINGS: Brain: No mass lesion or acute hemorrhage. No focal hypoattenuation of the basal ganglia or cortex to indicate infarcted tissue. No hydrocephalus or age advanced atrophy. Vascular: No hyperdense vessel. No advanced atherosclerotic calcification of the arteries at the skull base. Skull: Normal visualized skull base, calvarium and extracranial soft tissues. Sinuses/Orbits: No sinus fluid levels or advanced mucosal thickening. No mastoid effusion. Normal orbits. ASPECTS [facility] Stroke Program Early CT Score) - Ganglionic level infarction (caudate, lentiform nuclei, internal capsule, insula, M1-M3 cortex): 7 - Supraganglionic infarction (M4-M6 cortex): 3 Total score (0-10 with 10 being normal): 10 IMPRESSION: 1. Normal head CT. 2. ASPECTS is 10. These results were called by telephone at the time of interpretation on 09/24/2017 at 6:25 pm to Dr. Cherylann Banas, who verbally acknowledged these results. Electronically Signed   By: Ulyses Jarred M.D.   On: 09/24/2017 18:25  CT reported by radiology as no acute disease ____________________________________________   PROCEDURES  Procedure(s) performed:   Procedures  Critical Care performed:   ____________________________________________   INITIAL IMPRESSION / ASSESSMENT AND PLAN / ED COURSE  Pertinent labs & imaging results that were available during my care of the patient were reviewed by me and considered in my medical decision making (see chart for details).  telephone neurology feels that it would be best to evaluate the patient for strokelike symptoms and admit him.      ____________________________________________   FINAL CLINICAL IMPRESSION(S) / ED DIAGNOSES  Final diagnoses:  Cerebrovascular accident (CVA), unspecified mechanism ([place])      NEW MEDICATIONS STARTED DURING THIS VISIT:  New Prescriptions   No medications on file     Note:  This document was  prepared using Dragon voice recognition software and may include unintentional dictation errors.    Nena Polio, MD 09/24/17 Drema Halon    Nena Polio, MD 09/24/17 [phone]

## 2017-09-25 LAB — BASIC METABOLIC PANEL
Anion gap: 8 (ref 5–15)
BUN: 13 mg/dL (ref 6–20)
CALCIUM: 9.1 mg/dL (ref 8.9–10.3)
CHLORIDE: 104 mmol/L (ref 101–111)
CO2: 26 mmol/L (ref 22–32)
CREATININE: 1.03 mg/dL (ref 0.61–1.24)
GFR calc non Af Amer: >60 mL/min (ref >60)
Glucose, Bld: 126 mg/dL — ABNORMAL HIGH (ref 65–99)
Potassium: 4.2 mmol/L (ref 3.5–5.1)
SODIUM: 138 mmol/L (ref 135–145)

## 2017-09-25 LAB — CBC
HCT: 37.7 % — ABNORMAL LOW (ref 40.0–52.0)
Hemoglobin: 12.6 g/dL — ABNORMAL LOW (ref 13.0–18.0)
MCH: 25.3 pg — ABNORMAL LOW (ref 26.0–34.0)
MCHC: 33.3 g/dL (ref 32.0–36.0)
MCV: 75.8 fL — AB (ref 80.0–100.0)
PLATELETS: 275 10*3/uL (ref 150–440)
RBC: 4.97 MIL/uL (ref 4.40–5.90)
RDW: 14.1 % (ref 11.5–14.5)
WBC: 6.9 10*3/uL (ref 3.8–10.6)

## 2017-09-25 LAB — TROPONIN I: Troponin I: <0.03 ng/mL (ref <0.03)

## 2017-09-25 LAB — LIPID PANEL
CHOL/HDL RATIO: 8.4 ratio
Cholesterol: 211 mg/dL — ABNORMAL HIGH (ref 0–200)
HDL: 25 mg/dL — ABNORMAL LOW (ref >40)
LDL Cholesterol: 111 mg/dL — ABNORMAL HIGH (ref 0–99)
Triglycerides: 373 mg/dL — ABNORMAL HIGH (ref <150)
VLDL: 75 mg/dL — ABNORMAL HIGH (ref 0–40)

## 2017-09-25 NOTE — Progress Notes (Signed)
Johnny Rios at [place] was admitted to the [facility] on 09/24/2017 and Discharged  09/25/2017 and should be excused from work  for 1 days starting 09/24/2017 , may return to work without any restrictions starting 09/26/2017.  Max Sane M.D on 09/25/2017,at 8:36 AM  [place] at [facility]  [phone]

## 2017-09-25 NOTE — Plan of Care (Signed)
Pt was neg for stroke.  Was scheduled for stress test today and Dr talked to patient and he will do it outpt.  Will f/u with Dr. Candis Musa tomorrow.  His lipid panel was outside of normal - HDL 25 and Triglycerides were 373.  Gave information to patient regarding diet and exercise to lower triglycerides and raise HDL.  Patient willing to make lifestyle changes like giving up sugar sodas, and getting more exercise now that it's cooler.  Patient's girlfriend also willing to make changes regarding meals and prep.  Pt doesn't smoke or drink, but does have family hx of heart disease. Reviewed d/c instructions, f/u appts, IV removed - pt wheeled out by volunteer and he transported home with girlfriend.

## 2017-09-25 NOTE — Discharge Instructions (Signed)

## 2017-09-26 ENCOUNTER — Ambulatory Visit (INDEPENDENT_AMBULATORY_CARE_PROVIDER_SITE_OTHER): Payer: BLUE CROSS/BLUE SHIELD | Admitting: Cardiovascular Disease

## 2017-09-26 ENCOUNTER — Encounter: Payer: Self-pay | Admitting: Cardiovascular Disease

## 2017-09-26 VITALS — BP 110/62 | HR 109 | Ht 70.0 in | Wt 264.2 lb

## 2017-09-26 DIAGNOSIS — R000 Tachycardia, unspecified: Secondary | ICD-10-CM | POA: Diagnosis not present

## 2017-09-26 DIAGNOSIS — G4733 Obstructive sleep apnea (adult) (pediatric): Secondary | ICD-10-CM | POA: Diagnosis not present

## 2017-09-26 DIAGNOSIS — R200 Anesthesia of skin: Secondary | ICD-10-CM | POA: Diagnosis not present

## 2017-09-26 DIAGNOSIS — R7309 Other abnormal glucose: Secondary | ICD-10-CM | POA: Diagnosis not present

## 2017-09-26 DIAGNOSIS — R5382 Chronic fatigue, unspecified: Secondary | ICD-10-CM

## 2017-09-26 DIAGNOSIS — R079 Chest pain, unspecified: Secondary | ICD-10-CM | POA: Diagnosis not present

## 2017-09-26 DIAGNOSIS — E782 Mixed hyperlipidemia: Secondary | ICD-10-CM | POA: Insufficient documentation

## 2017-09-26 DIAGNOSIS — Z9989 Dependence on other enabling machines and devices: Secondary | ICD-10-CM | POA: Diagnosis not present

## 2017-09-26 LAB — HIV ANTIBODY (ROUTINE TESTING W REFLEX): HIV Screen 4th Generation wRfx: NONREACTIVE

## 2017-09-26 NOTE — Patient Instructions (Signed)
Medication Instructions:   No medication changes made  Labwork:  No new labs needed  Testing/Procedures:  No further testing at this time  Consider a CT coronary caclium score  $150 Looks at the heart for blockage   Follow-Up: It was a pleasure seeing you in the office today. Please call us if you have new issues that need to be addressed before your next appt.  [phone]  Your physician wants you to follow-up in:  As needed  If you need a refill on your cardiac medications before your next appointment, please call your pharmacy.
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
## 2017-10-02 ENCOUNTER — Inpatient Hospital Stay: Payer: BLUE CROSS/BLUE SHIELD | Admitting: Family Medicine

## 2017-10-02 NOTE — Progress Notes (Unsigned)
Pt was scheduled by hospital without awareness that he's scheduled for f/u already next week

## 2017-10-09 ENCOUNTER — Ambulatory Visit (INDEPENDENT_AMBULATORY_CARE_PROVIDER_SITE_OTHER): Payer: BLUE CROSS/BLUE SHIELD | Admitting: Family Medicine

## 2017-10-09 ENCOUNTER — Encounter: Payer: Self-pay | Admitting: Family Medicine

## 2017-10-09 DIAGNOSIS — E782 Mixed hyperlipidemia: Secondary | ICD-10-CM

## 2017-10-09 DIAGNOSIS — R7989 Other specified abnormal findings of blood chemistry: Secondary | ICD-10-CM | POA: Diagnosis not present

## 2017-10-09 DIAGNOSIS — F419 Anxiety disorder, unspecified: Secondary | ICD-10-CM

## 2017-10-09 MED ORDER — CLONAZEPAM 1 MG PO TABS: 1.0000 mg | ORAL_TABLET | Freq: Every day | ORAL | 2 refills | Status: DC | PRN

## 2017-10-09 NOTE — Addendum Note (Signed)
Addended by: Golden Pop A on: 10/09/2017 12:15 PM   Modules accepted: Orders

## 2017-10-09 NOTE — Progress Notes (Addendum)
BP 113/80   Pulse 96   Wt 267 lb (121.1 kg)   SpO2 99%   BMI 38.31 kg/m    Subjective:    [name], male    DOB: [birth year], 51 y.o.   MRN: [id number]  HPI: Johnny Rios is a 51 y.o. male  Chief Complaint  Patient presents with  . Follow-up   Patient follow-up all in all doing well good report from cardiology. Reviewed cardiology notes patient stable with no real further chest pain or chest pain issues. Reviewed anxiety taking clonazepam as ordered 1 pill every daywith an extra on some days. Knows about weight loss and weight loss techniques. Patient also still with low testosterone concerns now that cardiology is out of weight is ready to consider urology referral.  Burnis Medin go ahead with referral to Dr. Eliberto Ivory. We'll also need refill Klononepam Relevant past medical, surgical, family and social history reviewed and updated as indicated. Interim medical history since our last visit reviewed. Allergies and medications reviewed and updated.  Review of Systems  Constitutional: Negative.   Respiratory: Negative.   Cardiovascular: Negative.     Per HPI unless specifically indicated above     Objective:    BP 113/80   Pulse 96   Wt 267 lb (121.1 kg)   SpO2 99%   BMI 38.31 kg/m   Wt Readings from Last 3 Encounters:  10/09/17 267 lb (121.1 kg)  09/26/17 264 lb 4 oz (119.9 kg)  09/24/17 269 lb 8 oz (122.2 kg)    Physical Exam  Constitutional: He is oriented to person, place, and time. He appears well-developed and well-nourished.  HENT:  Head: Normocephalic and atraumatic.  Eyes: Conjunctivae and EOM are normal.  Neck: Normal range of motion.  Cardiovascular: Normal rate, regular rhythm and normal heart sounds.   Pulmonary/Chest: Effort normal and breath sounds normal.  Musculoskeletal: Normal range of motion.  Neurological: He is alert and oriented to person, place, and time.  Skin: No erythema.  Psychiatric: He has a normal mood and affect.  His behavior is normal. Judgment and thought content normal.    Results for orders placed or performed during the hospital encounter of 09/24/17  Protime-INR  Result Value Ref Range   Prothrombin Time 12.7 11.4 - 15.2 seconds   INR 0.96   APTT  Result Value Ref Range   aPTT 30 24 - 36 seconds  CBC  Result Value Ref Range   WBC 9.1 3.8 - 10.6 K/uL   RBC 5.04 4.40 - 5.90 MIL/uL   Hemoglobin 12.9 (L) 13.0 - 18.0 g/dL   HCT 38.2 (L) 40.0 - 52.0 %   MCV 75.9 (L) 80.0 - 100.0 fL   MCH 25.6 (L) 26.0 - 34.0 pg   MCHC 33.7 32.0 - 36.0 g/dL   RDW 14.1 11.5 - 14.5 %   Platelets 301 150 - 440 K/uL  Differential  Result Value Ref Range   Neutrophils Relative % 58 %   Neutro Abs 5.2 1.4 - 6.5 K/uL   Lymphocytes Relative 33 %   Lymphs Abs 2.9 1.0 - 3.6 K/uL   Monocytes Relative 7 %   Monocytes Absolute 0.6 0.2 - 1.0 K/uL   Eosinophils Relative 3 %   Eosinophils Absolute 0.2 0 - 0.7 K/uL   Basophils Relative 1 %   Basophils Absolute 0.1 0 - 0.1 K/uL  Comprehensive metabolic panel  Result Value Ref Range   Sodium 137 135 - 145 mmol/L  Potassium 3.7 3.5 - 5.1 mmol/L   Chloride 103 101 - 111 mmol/L   CO2 25 22 - 32 mmol/L   Glucose, Bld 118 (H) 65 - 99 mg/dL   BUN 13 6 - 20 mg/dL   Creatinine, Ser 1.07 0.61 - 1.24 mg/dL   Calcium 9.0 8.9 - 10.3 mg/dL   Total Protein 7.1 6.5 - 8.1 g/dL   Albumin 3.9 3.5 - 5.0 g/dL   AST 28 15 - 41 U/L   ALT 32 17 - 63 U/L   Alkaline Phosphatase 109 38 - 126 U/L   Total Bilirubin 0.3 0.3 - 1.2 mg/dL   GFR calc non Af Amer >60 >60 mL/min   GFR calc Af Amer >60 >60 mL/min   Anion gap 9 5 - 15  Troponin I  Result Value Ref Range   Troponin I <0.03 <0.03 ng/mL  Glucose, capillary  Result Value Ref Range   Glucose-Capillary 115 (H) 65 - 99 mg/dL  Lipid panel  Result Value Ref Range   Cholesterol 211 (H) 0 - 200 mg/dL   Triglycerides 373 (H) <150 mg/dL   HDL 25 (L) >40 mg/dL   Total CHOL/HDL Ratio 8.4 RATIO   VLDL 75 (H) 0 - 40 mg/dL   LDL  Cholesterol 111 (H) 0 - 99 mg/dL  Basic metabolic panel  Result Value Ref Range   Sodium 138 135 - 145 mmol/L   Potassium 4.2 3.5 - 5.1 mmol/L   Chloride 104 101 - 111 mmol/L   CO2 26 22 - 32 mmol/L   Glucose, Bld 126 (H) 65 - 99 mg/dL   BUN 13 6 - 20 mg/dL   Creatinine, Ser 1.03 0.61 - 1.24 mg/dL   Calcium 9.1 8.9 - 10.3 mg/dL   GFR calc non Af Amer >60 >60 mL/min   GFR calc Af Amer >60 >60 mL/min   Anion gap 8 5 - 15  CBC  Result Value Ref Range   WBC 6.9 3.8 - 10.6 K/uL   RBC 4.97 4.40 - 5.90 MIL/uL   Hemoglobin 12.6 (L) 13.0 - 18.0 g/dL   HCT 37.7 (L) 40.0 - 52.0 %   MCV 75.8 (L) 80.0 - 100.0 fL   MCH 25.3 (L) 26.0 - 34.0 pg   MCHC 33.3 32.0 - 36.0 g/dL   RDW 14.1 11.5 - 14.5 %   Platelets 275 150 - 440 K/uL  Troponin I (q 6hr x 3)  Result Value Ref Range   Troponin I <0.03 <0.03 ng/mL  Troponin I (q 6hr x 3)  Result Value Ref Range   Troponin I <0.03 <0.03 ng/mL  HIV antibody (Routine Testing)  Result Value Ref Range   HIV Screen 4th Generation wRfx Non Reactive Non Reactive      Assessment & Plan:   Problem List Items Addressed This Visit      Other   Anxiety    Discuss anxiety care and treatment limitations of only 45 clonazepam a month. Gave refills to last 3 months patient eligible for refills in 3 months.      Relevant Medications   clonazePAM (KLONOPIN) 1 MG tablet   Mixed hyperlipidemia    Cholesterol better with little bit of diet patient will continue discuss weight loss.      Low testosterone    Urology referral for further evaluation and possible treatment      Relevant Orders   Ambulatory referral to Urology       Follow up plan: Return in about  6 months (around 04/09/2018) for BMP,  Lipids, ALT, AST.

## 2017-10-09 NOTE — Assessment & Plan Note (Signed)
Cholesterol better with little bit of diet patient will continue discuss weight loss.

## 2017-10-09 NOTE — Assessment & Plan Note (Signed)
Discuss anxiety care and treatment limitations of only 45 clonazepam a month. Gave refills to last 3 months patient eligible for refills in 3 months.

## 2017-10-09 NOTE — Assessment & Plan Note (Signed)
Urology referral for further evaluation and possible treatment

## 2017-10-23 ENCOUNTER — Telehealth: Payer: Self-pay | Admitting: Family Medicine

## 2017-10-23 NOTE — Telephone Encounter (Signed)
Copied from [place]. Topic: Quick Communication - See Telephone Encounter >> Oct 23, 2017  1:33 PM Burnis Medin, NT wrote: CRM for notification. See Telephone encounter for:  10/23/17. [facility] called and said that some records were faxed over for this pt's referral but the Testerone test wasn't in there. Pt. Is there now. Fax number [phone]

## 2017-11-21 ENCOUNTER — Telehealth: Payer: Self-pay | Admitting: *Deleted

## 2017-11-21 NOTE — Telephone Encounter (Signed)
[name] (girlfriend) called to say that Ryheem has been "feeling something" at his belly button. She states he says "hernia come back". She states it has been an issues for at least 2 weeks. As I'm asking her questions, she does not know all the answer, advised that I would call him in the morning. She was offered an appointment on Dec 6, declined until I could talk with pt and Dr Bary Castilla, she wants him seen tomorrow.

## 2017-11-22 NOTE — Telephone Encounter (Signed)
I talked with the patient and he states he has noticed a soft small bulge "like he had before" for less than a week. He just wants it checked. Denies nausea, vomiting, pain or redness to the area. He agrees with appointment on Dec 6 at 9:00.

## 2017-11-30 ENCOUNTER — Encounter: Payer: Self-pay | Admitting: General Surgery

## 2017-11-30 ENCOUNTER — Ambulatory Visit (INDEPENDENT_AMBULATORY_CARE_PROVIDER_SITE_OTHER): Payer: BLUE CROSS/BLUE SHIELD | Admitting: General Surgery

## 2017-11-30 VITALS — BP 138/78 | HR 74 | Resp 14 | Ht 71.0 in | Wt 276.0 lb

## 2017-11-30 DIAGNOSIS — K429 Umbilical hernia without obstruction or gangrene: Secondary | ICD-10-CM

## 2017-11-30 NOTE — Patient Instructions (Signed)
Patient to return as needed. The patient is aware to call back for any questions or concerns. 

## 2017-11-30 NOTE — Progress Notes (Signed)
[name], male   DOB: [birth year], 51 y.o.   MRN: [id number]  Chief Complaint  Patient presents with  . Follow-up    HPI Johnny Rios is a 51 y.o. male here today for a recheck umbilical hernia repair. states he has noticed a soft small bulge "like he had before" for less than a week. He just wants it checked. Denies nausea, vomiting, pain or redness to the area. He is here today with his girlfriend [place].  HPI  Past Medical History:  Diagnosis Date  . Anxiety   . Arthritis   . GERD (gastroesophageal reflux disease)   . History of kidney stones   . Sleep apnea    Uses CPAP    Past Surgical History:  Procedure Laterality Date  . APPENDECTOMY    . CHOLECYSTECTOMY N/A 01/19/2017   Procedure: LAPAROSCOPIC CHOLECYSTECTOMY WITH INTRAOPERATIVE CHOLANGIOGRAM;  Surgeon: Robert Bellow, MD;  Location: [facility] ORS;  Service: General;  Laterality: N/A;  . TONSILLECTOMY    . UMBILICAL HERNIA REPAIR N/A 06/12/2017   Ventral hernia with 6 cm Ventralex ST mesh.    Family History  Problem Relation Age of Onset  . Cancer Father        lung  . Alzheimer's disease Mother   . Heart attack Brother   . Heart attack Brother     Social History Social History   Tobacco Use  . Smoking status: Never Smoker  . Smokeless tobacco: Former Systems developer    Types: Chew  Substance Use Topics  . Alcohol use: Yes    Comment: occasional  . Drug use: No    No Known Allergies  Current Outpatient Medications  Medication Sig Dispense Refill  . clonazePAM (KLONOPIN) 1 MG tablet Take 1-2 tablets (1-2 mg total) by mouth daily as needed for anxiety. 45 tablet 2  . esomeprazole (NEXIUM) 20 MG capsule Take 1 capsule (20 mg total) by mouth every morning. (Patient taking differently: Take 20 mg by mouth daily before breakfast. ) 90 capsule 4   No current facility-administered medications for this visit.     Review of Systems Review of Systems  Constitutional: Negative.   Respiratory:  Negative.   Cardiovascular: Negative.     Blood pressure 138/78, pulse 74, resp. rate 14, height 5\' 11"  (1.803 m), weight 276 lb (125.2 kg).  Physical Exam Physical Exam  Constitutional: He is oriented to person, place, and time. He appears well-developed and well-nourished.  Abdominal: Soft. Normal appearance and bowel sounds are normal. There is no tenderness. No hernia.    Neurological: He is alert and oriented to person, place, and time.  Skin: Skin is warm and dry.      Assessment    No evidence recurrent umbilical hernia.    Plan         Patient to return as needed. The patient is aware to call back for any questions or concerns.  HPI, Physical Exam, Assessment and Plan have been scribed under the direction and in the presence of Hervey Ard, MD.  [name], CMA   I have completed the exam and reviewed the above documentation for accuracy and completeness.  I agree with the above.  Haematologist has been used and any errors in dictation or transcription are unintentional.  Hervey Ard, M.D., F.A.C.S.  Robert Bellow 11/30/2017, 8:46 PM

## 2018-01-13 ENCOUNTER — Encounter: Payer: Self-pay | Admitting: Neurology

## 2018-01-15 ENCOUNTER — Ambulatory Visit: Payer: BLUE CROSS/BLUE SHIELD | Admitting: Adult Health

## 2018-01-15 ENCOUNTER — Encounter: Payer: Self-pay | Admitting: Adult Health

## 2018-01-15 VITALS — BP 140/91 | HR 95 | Ht 69.0 in | Wt 272.0 lb

## 2018-01-15 DIAGNOSIS — G4733 Obstructive sleep apnea (adult) (pediatric): Secondary | ICD-10-CM

## 2018-01-15 DIAGNOSIS — Z9989 Dependence on other enabling machines and devices: Secondary | ICD-10-CM | POA: Diagnosis not present

## 2018-01-15 NOTE — Patient Instructions (Signed)
Your Plan:  Try using CPAP nightly. If your symptoms worsen or you develop new symptoms please let us know.   Thank you for coming to see [place] at [facility]. I hope we have been able to provide you high quality care today.  You may receive a patient satisfaction survey over the next few weeks. We would appreciate your feedback and comments so that we may continue to improve ourselves and the health of our patients.

## 2018-01-15 NOTE — Progress Notes (Addendum)
PATIENT: Johnny Rios DOB: 24-Feb-1966  REASON FOR VISIT: follow up HISTORY FROM: patient  HISTORY OF PRESENT ILLNESS: Today 01/15/18 Johnny Rios is a 52 year old male with a history of obstructive sleep apnea on CPAP.  He returns today for an evaluation.  His CPAP download indicates that he use his machine 17 out of 30 days for compliance of 57%.  He uses machine greater than 4 hours 9 out of 30 days for compliance of 30%. on average he uses his machine 3 hours and 53 minutes.  His residual AHI is 9.4 on AutoSet with a minimum pressure of 5 cm of water and maximum pressure of 13 cm of water with EPR of 3.  He does not have a significant leak.  His average pressure is 12.  He reports that he has not used his machine in the last several weeks consistently due to a common cold.  He also states that since he has not been working he finds himself staying up most of the night and then napping during the day.  He states that he does have a job lined up and hopes that this will help him develop a regular sleep routine.  He states that he is sleepy throughout the day but contributes this to his sleep routine.  He also reports that he was recently diagnosed with low testosterone and will be receiving injections in the future.  He returns today for an evaluation.  HISTORY 07/13/2017 I reviewed his AutoPap compliance data from 06/12/2017 through 07/11/2017, which is a total of 30 days, during which time he used his machine 25 days with percent used days greater than 4 hours at 70%, indicating adequate compliance with an average usage of 5 hours and 27 minutes, residual AHI 3.4 per hour, 95th percentile of pressure at 12.9 cm, leak, high side with the 95th percentile at 23.8 L/m, pressure setting of 5-13 cm. In the past 90 days his compliance percentage was 79%. He reports doing a little better. He feels less tired during the day, his wife reports that he tends to take less naps during the day. He does have  the occasional skipped night because of recent issues, one time he had nausea, one time he had nasal congestion and one time he went on an overnight trip did not take his machine with him. Generally speaking, he feels improved and is motivated to continue with treatment. He is trying to watch what he eats and trying to lose weight but weight has plateaued. He drinks sweet tea mostly during the day, some water.  REVIEW OF SYSTEMS: Out of a complete 14 system review of symptoms, the patient complains only of the following symptoms, and all other reviewed systems are negative.  Chest tightness, apnea, daytime sleepiness, snoring, back pain, aching muscles, joint pain, memory loss  ALLERGIES: No Known Allergies  HOME MEDICATIONS: Outpatient Medications Prior to Visit  Medication Sig Dispense Refill  . clonazePAM (KLONOPIN) 1 MG tablet Take 1-2 tablets (1-2 mg total) by mouth daily as needed for anxiety. 45 tablet 2  . esomeprazole (NEXIUM) 20 MG capsule Take 1 capsule (20 mg total) by mouth every morning. (Patient taking differently: Take 20 mg by mouth daily before breakfast. ) 90 capsule 4   No facility-administered medications prior to visit.     PAST MEDICAL HISTORY: Past Medical History:  Diagnosis Date  . Anxiety   . Arthritis   . GERD (gastroesophageal reflux disease)   . History of kidney  stones   . Sleep apnea    Uses CPAP    PAST SURGICAL HISTORY: Past Surgical History:  Procedure Laterality Date  . APPENDECTOMY    . CHOLECYSTECTOMY N/A 01/19/2017   Procedure: LAPAROSCOPIC CHOLECYSTECTOMY WITH INTRAOPERATIVE CHOLANGIOGRAM;  Surgeon: Robert Bellow, MD;  Location: [facility] ORS;  Service: General;  Laterality: N/A;  . TONSILLECTOMY    . UMBILICAL HERNIA REPAIR N/A 06/12/2017   Ventral hernia with 6 cm Ventralex ST mesh.    FAMILY HISTORY: Family History  Problem Relation Age of Onset  . Cancer Father        lung  . Alzheimer's disease Mother   . Heart attack Brother     . Heart attack Brother     SOCIAL HISTORY: Social History   Socioeconomic History  . Marital status: Significant Other    Spouse name: Not on file  . Number of children: 2  . Years of education: HS  . Highest education level: Not on file  Social Needs  . Financial resource strain: Not on file  . Food insecurity - worry: Not on file  . Food insecurity - inability: Not on file  . Transportation needs - medical: Not on file  . Transportation needs - non-medical: Not on file  Occupational History  . Not on file  Tobacco Use  . Smoking status: Never Smoker  . Smokeless tobacco: Former Systems developer    Types: Chew  Substance and Sexual Activity  . Alcohol use: Yes    Comment: occasional  . Drug use: No  . Sexual activity: Not on file  Other Topics Concern  . Not on file  Social History Narrative   Drinks 3-4 caffeine drinks a day       PHYSICAL EXAM  Vitals:   01/15/18 0744  BP: (!) 140/91  Pulse: 95  Weight: 272 lb (123.4 kg)  Height: 5\' 9"  (1.753 m)   Body mass index is 40.17 kg/m.  Generalized: Well developed, in no acute distress  Chest: Lungs clear to auscultation bilaterally  Neurological examination  Mentation: Alert oriented to time, place, history taking. Follows all commands speech and language fluent Cranial nerve II-XII: Pupils were equal round reactive to light. Extraocular movements were full, visual field were full on confrontational test. Facial sensation and strength were normal. Uvula tongue midline. Head turning and shoulder shrug  were normal and symmetric. Motor: The motor testing reveals 5 over 5 strength of all 4 extremities. Good symmetric motor tone is noted throughout.  Sensory: Sensory testing is intact to soft touch on all 4 extremities. No evidence of extinction is noted.  Coordination: Cerebellar testing reveals good finger-nose-finger and heel-to-shin bilaterally.  Gait and station: Gait is normal.  Reflexes: Deep tendon reflexes are  symmetric and normal bilaterally.   DIAGNOSTIC DATA (LABS, IMAGING, TESTING) - I reviewed patient records, labs, notes, testing and imaging myself where available.  Lab Results  Component Value Date   WBC 6.9 09/25/2017   HGB 12.6 (L) 09/25/2017   HCT 37.7 (L) 09/25/2017   MCV 75.8 (L) 09/25/2017   PLT 275 09/25/2017      Component Value Date/Time   NA 138 09/25/2017 0710   NA 141 07/26/2017 1003   K 4.2 09/25/2017 0710   CL 104 09/25/2017 0710   CO2 26 09/25/2017 0710   GLUCOSE 126 (H) 09/25/2017 0710   BUN 13 09/25/2017 0710   BUN 10 07/26/2017 1003   CREATININE 1.03 09/25/2017 0710   CALCIUM 9.1 09/25/2017 0710  PROT 7.1 09/24/2017 1824   PROT 6.8 07/26/2017 1003   ALBUMIN 3.9 09/24/2017 1824   ALBUMIN 4.1 07/26/2017 1003   AST 28 09/24/2017 1824   ALT 32 09/24/2017 1824   ALKPHOS 109 09/24/2017 1824   BILITOT 0.3 09/24/2017 1824   BILITOT 0.4 07/26/2017 1003   GFRNONAA >60 09/25/2017 0710   GFRAA >60 09/25/2017 0710   Lab Results  Component Value Date   CHOL 211 (H) 09/25/2017   HDL 25 (L) 09/25/2017   LDLCALC 111 (H) 09/25/2017   TRIG 373 (H) 09/25/2017   CHOLHDL 8.4 09/25/2017    Lab Results  Component Value Date   TSH 1.650 07/26/2017      ASSESSMENT AND PLAN 52 y.o. year old male  has a past medical history of Anxiety, Arthritis, GERD (gastroesophageal reflux disease), History of kidney stones, and Sleep apnea. here with:  1.  Obstructive sleep apnea on CPAP  The patient's CPAP download shows suboptimal compliance.  However the patient states that he has been unable to use it due to a common cold.  I advised the patient that he should attempt to use the machine nightly greater than 4 hours each night.  We also talked about developing a regular sleep routine.  He is advised that if his symptoms worsen or he develops new symptoms he should let us know.  He will follow-up in 6 months or sooner if needed.  I spent 15 minutes with the patient. 50% of  this time was spent reviewing his CPAP download.      Ward Givens, MSN, NP-C 01/15/2018, 7:15 AM [facility] [address] [place], [place] [address] [phone]  I reviewed the above note and documentation by the Nurse Practitioner and agree with the history, physical exam, assessment and plan as outlined above. I was immediately available for face-to-face consultation. Star Age, MD, PhD [facility])

## 2018-02-06 ENCOUNTER — Encounter: Payer: Self-pay | Admitting: Family Medicine

## 2018-02-20 ENCOUNTER — Other Ambulatory Visit: Payer: Self-pay | Admitting: Family Medicine

## 2018-02-20 DIAGNOSIS — F419 Anxiety disorder, unspecified: Secondary | ICD-10-CM

## 2018-02-21 NOTE — Telephone Encounter (Signed)
Request for refill on clonazepam.  Last refill was 01/17/18 for 45 tablets.  LOV  10/09/17 NOV 04/16/18  Provider: Dr. Golden Pop  Please review.

## 2018-02-21 NOTE — Telephone Encounter (Signed)
Pt checking on refill request. Dr. Jeananne Rama last time had to give a written one because the last time an e script was sent through and the pharmacy didn't receive it.

## 2018-03-02 ENCOUNTER — Other Ambulatory Visit: Payer: Self-pay | Admitting: General Surgery

## 2018-03-02 DIAGNOSIS — K469 Unspecified abdominal hernia without obstruction or gangrene: Secondary | ICD-10-CM

## 2018-03-14 ENCOUNTER — Ambulatory Visit
Admission: RE | Admit: 2018-03-14 | Discharge: 2018-03-14 | Disposition: A | Discharge status: Home / Self Care | Payer: BLUE CROSS/BLUE SHIELD | Source: Ambulatory Visit | Attending: General Surgery | Admitting: General Surgery

## 2018-03-22 ENCOUNTER — Ambulatory Visit
Admission: RE | Admit: 2018-03-22 | Discharge: 2018-03-22 | Disposition: A | Discharge status: Home / Self Care | Payer: BLUE CROSS/BLUE SHIELD | Source: Ambulatory Visit | Attending: General Surgery | Admitting: General Surgery

## 2018-03-22 DIAGNOSIS — K469 Unspecified abdominal hernia without obstruction or gangrene: Secondary | ICD-10-CM

## 2018-03-22 DIAGNOSIS — K449 Diaphragmatic hernia without obstruction or gangrene: Secondary | ICD-10-CM | POA: Insufficient documentation

## 2018-03-22 DIAGNOSIS — M4316 Spondylolisthesis, lumbar region: Secondary | ICD-10-CM | POA: Insufficient documentation

## 2018-03-22 DIAGNOSIS — M5136 Other intervertebral disc degeneration, lumbar region: Secondary | ICD-10-CM | POA: Insufficient documentation

## 2018-03-22 MED ORDER — IOPAMIDOL (ISOVUE-300) INJECTION 61%
100.0000 mL | Freq: Once | INTRAVENOUS | Status: AC | PRN
  Administered 2018-03-22: 100 mL via INTRAVENOUS

## 2018-03-30 ENCOUNTER — Ambulatory Visit: Payer: BLUE CROSS/BLUE SHIELD | Admitting: Family Medicine

## 2018-04-09 ENCOUNTER — Ambulatory Visit: Payer: BLUE CROSS/BLUE SHIELD | Admitting: Family Medicine

## 2018-04-16 ENCOUNTER — Encounter: Payer: Self-pay | Admitting: Family Medicine

## 2018-04-16 ENCOUNTER — Ambulatory Visit (INDEPENDENT_AMBULATORY_CARE_PROVIDER_SITE_OTHER): Payer: BLUE CROSS/BLUE SHIELD | Admitting: Family Medicine

## 2018-04-16 VITALS — BP 124/85 | HR 105 | Ht 71.0 in | Wt 268.0 lb

## 2018-04-16 DIAGNOSIS — F419 Anxiety disorder, unspecified: Secondary | ICD-10-CM

## 2018-04-16 DIAGNOSIS — E782 Mixed hyperlipidemia: Secondary | ICD-10-CM | POA: Diagnosis not present

## 2018-04-16 DIAGNOSIS — R5382 Chronic fatigue, unspecified: Secondary | ICD-10-CM | POA: Diagnosis not present

## 2018-04-16 DIAGNOSIS — M51369 Other intervertebral disc degeneration, lumbar region without mention of lumbar back pain or lower extremity pain: Secondary | ICD-10-CM | POA: Insufficient documentation

## 2018-04-16 DIAGNOSIS — M5136 Other intervertebral disc degeneration, lumbar region: Secondary | ICD-10-CM

## 2018-04-16 MED ORDER — CLONAZEPAM 1 MG PO TABS: 1.0000 mg | ORAL_TABLET | Freq: Every day | ORAL | 5 refills | Status: DC | PRN

## 2018-04-16 MED ORDER — MELOXICAM 15 MG PO TABS: 15.0000 mg | ORAL_TABLET | Freq: Every day | ORAL | 3 refills | Status: DC
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
## 2018-04-16 NOTE — Assessment & Plan Note (Signed)
Chronic anxiety stable clonazepam without problems no excess use.  No early refills.

## 2018-04-16 NOTE — Assessment & Plan Note (Signed)
Discussed degenerative arthritis showing up on CT scan patient had done this year. We will give meloxicam Also orthopedic referral.

## 2018-04-23 DIAGNOSIS — M5416 Radiculopathy, lumbar region: Secondary | ICD-10-CM | POA: Insufficient documentation

## 2018-05-22 ENCOUNTER — Other Ambulatory Visit: Payer: Self-pay | Admitting: Family Medicine

## 2018-07-02 ENCOUNTER — Telehealth: Payer: Self-pay | Admitting: Family Medicine

## 2018-07-02 NOTE — Telephone Encounter (Signed)
Phone call Discussed with patient will not increase lorazepam continue current dosages.

## 2018-07-02 NOTE — Telephone Encounter (Signed)
Call pt 

## 2018-07-02 NOTE — Telephone Encounter (Signed)
Patient was transferred to provider for telephone conversation.   

## 2018-07-02 NOTE — Telephone Encounter (Signed)
Copied from [place] [id number]. Topic: Quick Communication - See Telephone Encounter >> Jul 02, 2018 11:20 AM Conception Chancy, NT wrote: CRM for notification. See Telephone encounter for: 07/02/18.  Patient spouse is calling and states that clonazePAM (KLONOPIN) 1 MG tablet is supposed to say take 1 tablet daily and as needed. She states sometimes he has to take 2 and the pharmacy will not refill it with it saying just take 1 a day. Patient would like to be notified when this is fixed. Please advise.   [facility] - [place], [address] [address] Phone: [phone] Fax: [phone]

## 2018-07-05 ENCOUNTER — Ambulatory Visit: Payer: BLUE CROSS/BLUE SHIELD | Admitting: General Surgery

## 2018-07-05 ENCOUNTER — Encounter: Payer: Self-pay | Admitting: General Surgery

## 2018-07-05 VITALS — BP 134/72 | HR 74 | Ht 70.0 in | Wt 268.0 lb

## 2018-07-05 DIAGNOSIS — K432 Incisional hernia without obstruction or gangrene: Secondary | ICD-10-CM | POA: Insufficient documentation

## 2018-07-05 DIAGNOSIS — M6208 Separation of muscle (nontraumatic), other site: Secondary | ICD-10-CM | POA: Diagnosis not present

## 2018-07-05 NOTE — Patient Instructions (Addendum)
Umbilical Hernia, Adult A hernia is a bulge of tissue that pushes through an opening between muscles. An umbilical hernia happens in the abdomen, near the belly button (umbilicus). The hernia may contain tissues from the small intestine, large intestine, or fatty tissue covering the intestines (omentum). Umbilical hernias in adults tend to get worse over time, and they require surgical treatment. There are several types of umbilical hernias. You may have:  A hernia located just above or below the umbilicus (indirect hernia). This is the most common type of umbilical hernia in adults.  A hernia that forms through an opening formed by the umbilicus (direct hernia).  A hernia that comes and goes (reducible hernia). A reducible hernia may be visible only when you strain, lift something heavy, or cough. This type of hernia can be pushed back into the abdomen (reduced).  A hernia that traps abdominal tissue inside the hernia (incarcerated hernia). This type of hernia cannot be reduced.  A hernia that cuts off blood flow to the tissues inside the hernia (strangulated hernia). The tissues can start to die if this happens. This type of hernia requires emergency treatment.  What are the causes? An umbilical hernia happens when tissue inside the abdomen presses on a weak area of the abdominal muscles. What increases the risk? You may have a greater risk of this condition if you:  Are obese.  Have had several pregnancies.  Have a buildup of fluid inside your abdomen (ascites).  Have had surgery that weakens the abdominal muscles.  What are the signs or symptoms? The main symptom of this condition is a painless bulge at or near the belly button. A reducible hernia may be visible only when you strain, lift something heavy, or cough. Other symptoms may include:  Dull pain.  A feeling of pressure.  Symptoms of a strangulated hernia may include:  Pain that gets increasingly worse.  Nausea and  vomiting.  Pain when pressing on the hernia.  Skin over the hernia becoming red or purple.  Constipation.  Blood in the stool.  How is this diagnosed? This condition may be diagnosed based on:  A physical exam. You may be asked to cough or strain while standing. These actions increase the pressure inside your abdomen and force the hernia through the opening in your muscles. Your health care provider may try to reduce the hernia by pressing on it.  Your symptoms and medical history.  How is this treated? Surgery is the only treatment for an umbilical hernia. Surgery for a strangulated hernia is done as soon as possible. If you have a small hernia that is not incarcerated, you may need to lose weight before having surgery. Follow these instructions at home:  Lose weight, if told by your health care provider.  Do not try to push the hernia back in.  Watch your hernia for any changes in color or size. Tell your health care provider if any changes occur.  You may need to avoid activities that increase pressure on your hernia.  Do not lift anything that is heavier than 10 lb (4.5 kg) until your health care provider says that this is safe.  Take over-the-counter and prescription medicines only as told by your health care provider.  Keep all follow-up visits as told by your health care provider. This is important. Contact a health care provider if:  Your hernia gets larger.  Your hernia becomes painful. Get help right away if:  You develop sudden, severe pain near the   area of your hernia.  You have pain as well as nausea or vomiting.  You have pain and the skin over your hernia changes color.  You develop a fever. This information is not intended to replace advice given to you by your health care provider. Make sure you discuss any questions you have with your health care provider. Document Released: 05/13/2016 Document Revised: 08/14/2016 Document Reviewed:  05/13/2016 Elsevier Interactive Patient Education  [employer].   The patient is scheduled for surgery at [facility] on 07/11/18. He will pre admit by phone. The patient is aware of date and instructions.

## 2018-07-05 NOTE — Progress Notes (Signed)
[name], male   DOB: Nov 17, 1966, 52 y.o.   MRN: [id number]  Chief Complaint  Patient presents with  . Other    HPI Johnny Rios is a 52 y.o. male here today for a evaluation of a possible umbilical hernia.He was seen in December 2018 for this problem. He states the area has got bigger and some pain with activity. Umbilical hernia repair was done on 06/12/2017. He is here today with his girlfriend Johnny Rios.    HPI  Past Medical History:  Diagnosis Date  . Anxiety   . Arthritis   . GERD (gastroesophageal reflux disease)   . History of kidney stones   . Sleep apnea    Uses CPAP    Past Surgical History:  Procedure Laterality Date  . APPENDECTOMY    . CHOLECYSTECTOMY N/A 01/19/2017   Procedure: LAPAROSCOPIC CHOLECYSTECTOMY WITH INTRAOPERATIVE CHOLANGIOGRAM;  Surgeon: Robert Bellow, MD;  Location: [facility] ORS;  Service: General;  Laterality: N/A;  . TONSILLECTOMY    . UMBILICAL HERNIA REPAIR N/A 06/12/2017   Ventral hernia with 6 cm Ventralex ST mesh.    Family History  Problem Relation Age of Onset  . Cancer Father        lung  . Alzheimer's disease Mother   . Heart attack Brother   . Heart attack Brother     Social History Social History   Tobacco Use  . Smoking status: Never Smoker  . Smokeless tobacco: Former Systems developer    Types: Chew  Substance Use Topics  . Alcohol use: Yes    Comment: occasional  . Drug use: No    No Known Allergies  Current Outpatient Medications  Medication Sig Dispense Refill  . clonazePAM (KLONOPIN) 1 MG tablet Take 1 tablet (1 mg total) by mouth daily as needed for anxiety. 45 tablet 5  . esomeprazole (NEXIUM) 20 MG capsule Take 1 capsule  by mouth every morning. 30 capsule 3  . meloxicam (MOBIC) 15 MG tablet Take 1 tablet (15 mg total) by mouth daily. 30 tablet 3   No current facility-administered medications for this visit.     Review of Systems Review of Systems  Constitutional: Negative.   Respiratory:  Negative.   Cardiovascular: Negative.     Blood pressure 134/72, pulse 74, height 5\' 10"  (1.778 m), weight 268 lb (121.6 kg). The patient has lost 8 pounds since his visit in December 2018.  Physical Exam Physical Exam  Constitutional: He is oriented to person, place, and time. He appears well-developed and well-nourished.  Eyes: Conjunctivae are normal. No scleral icterus.  Neck: Neck supple.  Cardiovascular: Normal rate, regular rhythm and normal heart sounds.  Pulmonary/Chest: Effort normal and breath sounds normal.  Abdominal: Soft. Bowel sounds are normal. A hernia is present.    Lymphadenopathy:    He has no cervical adenopathy.  Neurological: He is alert and oriented to person, place, and time.  Skin: Skin is warm and dry.    Data Reviewed Ventralex mesh with trans-fascial sutures at 4 corners these bar placed at time of original repair  Assessment    Recurrent ventral hernia.  Diastases recti.    Plan Reviewed procedure for elective repair of diastases, and at this time the patient is thankfully not interested.  Have suggested a laparoscopic repair with placement of mesh.  This will include trans-fascial sutures which can be uncomfortable until they dissolve at 6 months.   Hernia precautions and incarceration were discussed with the patient. If they  develop symptoms of an incarcerated hernia, they were encouraged to seek prompt medical attention.  I have recommended repair of the hernia using mesh on an outpatient basis in the near future. The risk of infection was reviewed. The role of prosthetic mesh to minimize the risk of recurrence was reviewed.  HPI, Physical Exam, Assessment and Plan have been scribed under the direction and in the presence of Hervey Ard, MD.  [name], CMA  I have completed the exam and reviewed the above documentation for accuracy and completeness.  I agree with the above.  Haematologist has been used and any errors in  dictation or transcription are unintentional.  Hervey Ard, M.D., F.A.C.S.  The patient is scheduled for surgery at [facility] on 07/11/18. He will pre admit by phone. The patient is aware of date and instructions.  Documented by Caryl-Lyn Otis Brace LPN  [name] 07/05/2018, 9:17 AM

## 2018-07-09 ENCOUNTER — Other Ambulatory Visit: Payer: Self-pay

## 2018-07-09 ENCOUNTER — Encounter
Admission: RE | Admit: 2018-07-09 | Discharge: 2018-07-09 | Disposition: A | Discharge status: Home / Self Care | Payer: BLUE CROSS/BLUE SHIELD | Source: Ambulatory Visit | Attending: General Surgery | Admitting: General Surgery

## 2018-07-09 HISTORY — DX: Tachycardia, unspecified: R00.0

## 2018-07-09 HISTORY — DX: Malignant (primary) neoplasm, unspecified: C80.1

## 2018-07-09 NOTE — Patient Instructions (Signed)
Your procedure is scheduled on: 07-11-18 [facility] 352 Report to Same Day Surgery 2nd floor medical mall [facility] Entrance-take elevator on left to 2nd floor.  Check in with surgery information desk.) To find out your arrival time please call [phone] between 1PM - 3PM on 07-10-18 [facility]  Remember: Instructions that are not followed completely may result in serious medical risk, up to and including death, or upon the discretion of your surgeon and anesthesiologist your surgery may need to be rescheduled.    _x___ 1. Do not eat food after midnight the night before your procedure. NO GUM OR CANDY AFTER MIDNIGHT.  You may drink clear liquids up to 2 hours before you are scheduled to arrive at the hospital for your procedure.  Do not drink clear liquids within 2 hours of your scheduled arrival to the hospital.  Clear liquids include  --Water or Apple juice without pulp  --Clear carbohydrate beverage such as ClearFast or Gatorade  --Black Coffee or Clear Tea (No milk, no creamers, do not add anything to  the coffee or Tea    __x__ 2. No Alcohol for 24 hours before or after surgery.   __x__3. No Smoking or e-cigarettes for 24 prior to surgery.  Do not use any chewable tobacco products for at least 6 hour prior to surgery   ____  4. Bring all medications with you on the day of surgery if instructed.    __x__ 5. Notify your doctor if there is any change in your medical condition     (cold, fever, infections).    x___6. On the morning of surgery brush your teeth with toothpaste and water.  You may rinse your mouth with mouth wash if you wish.  Do not swallow any toothpaste or mouthwash.   Do not wear jewelry, make-up, hairpins, clips or nail polish.  Do not wear lotions, powders, or perfumes. You may wear deodorant.  Do not shave 48 hours prior to surgery. Men may shave face and neck.  Do not bring valuables to the hospital.    [facility] is not responsible for any belongings or  valuables.               Contacts, dentures or bridgework may not be worn into surgery.  Leave your suitcase in the car. After surgery it may be brought to your room.  For patients admitted to the hospital, discharge time is determined by your treatment team.  _  Patients discharged the day of surgery will not be allowed to drive home.  You will need someone to drive you home and stay with you the night of your procedure.    Please read over the following fact sheets that you were given:   [facility] Preparing for Surgery  _x___ TAKE THE FOLLOWING MEDICATION THE MORNING OF SURGERY WITH A SMALL SIP OF WATER. These include:  1. KLONOPIN (CLONAZEPAM)   2. NEXIUM (ESOMEPRAZOLE)  3. TAKE AN EXTRA NEXIUM THE NIGHT BEFORE YOUR SURGERY  4.  5.  6.  ____Fleets enema or Magnesium Citrate as directed.   _x___ Use CHG Soap or sage wipes as directed on instruction sheet   ____ Use inhalers on the day of surgery and bring to hospital day of surgery  ____ Stop Metformin and Janumet 2 days prior to surgery.    ____ Take 1/2 of usual insulin dose the night before surgery and none on the morning surgery.   ____ Follow recommendations from Cardiologist, Pulmonologist or PCP regarding stopping  Aspirin, Coumadin, Plavix ,Eliquis, Effient, or Pradaxa, and Pletal.  ____Stop Anti-inflammatories such as Advil, Aleve, Ibuprofen, Motrin, Naproxen, Naprosyn, Goodies powders or aspirin products. OK to take Tylenol   ____ Stop supplements until after surgery.    _X___ Bring C-Pap to the hospital.

## 2018-07-10 ENCOUNTER — Encounter
Admission: RE | Admit: 2018-07-10 | Discharge: 2018-07-10 | Disposition: A | Discharge status: Home / Self Care | Payer: BLUE CROSS/BLUE SHIELD | Source: Ambulatory Visit | Attending: General Surgery | Admitting: General Surgery

## 2018-07-10 DIAGNOSIS — R000 Tachycardia, unspecified: Secondary | ICD-10-CM | POA: Diagnosis not present

## 2018-07-10 DIAGNOSIS — I491 Atrial premature depolarization: Secondary | ICD-10-CM | POA: Insufficient documentation

## 2018-07-11 ENCOUNTER — Other Ambulatory Visit: Payer: Self-pay

## 2018-07-11 ENCOUNTER — Encounter
Admission: RE | Disposition: A | Discharge status: Home / Self Care | Payer: Self-pay | Source: Ambulatory Visit | Attending: General Surgery

## 2018-07-11 ENCOUNTER — Ambulatory Visit: Payer: BLUE CROSS/BLUE SHIELD | Admitting: Certified Registered"

## 2018-07-11 ENCOUNTER — Ambulatory Visit
Admission: RE | Admit: 2018-07-11 | Discharge: 2018-07-11 | Disposition: A | Discharge status: Home / Self Care | Payer: BLUE CROSS/BLUE SHIELD | Source: Ambulatory Visit | Attending: General Surgery | Admitting: General Surgery

## 2018-07-11 DIAGNOSIS — K219 Gastro-esophageal reflux disease without esophagitis: Secondary | ICD-10-CM | POA: Diagnosis not present

## 2018-07-11 DIAGNOSIS — Z79899 Other long term (current) drug therapy: Secondary | ICD-10-CM | POA: Insufficient documentation

## 2018-07-11 DIAGNOSIS — K432 Incisional hernia without obstruction or gangrene: Secondary | ICD-10-CM | POA: Diagnosis not present

## 2018-07-11 DIAGNOSIS — F419 Anxiety disorder, unspecified: Secondary | ICD-10-CM | POA: Insufficient documentation

## 2018-07-11 DIAGNOSIS — G4730 Sleep apnea, unspecified: Secondary | ICD-10-CM | POA: Insufficient documentation

## 2018-07-11 DIAGNOSIS — Z791 Long term (current) use of non-steroidal anti-inflammatories (NSAID): Secondary | ICD-10-CM | POA: Insufficient documentation

## 2018-07-11 DIAGNOSIS — Z8711 Personal history of peptic ulcer disease: Secondary | ICD-10-CM | POA: Diagnosis not present

## 2018-07-11 HISTORY — PX: VENTRAL HERNIA REPAIR: SHX424

## 2018-07-11 SURGERY — REPAIR, HERNIA, VENTRAL, LAPAROSCOPIC
Anesthesia: General | Site: Abdomen | Wound class: Clean

## 2018-07-11 MED ORDER — ACETAMINOPHEN 325 MG PO TABS: 325.0000 mg | ORAL_TABLET | ORAL | Status: DC | PRN

## 2018-07-11 MED ORDER — DEXAMETHASONE SODIUM PHOSPHATE 10 MG/ML IJ SOLN
INTRAMUSCULAR | Status: AC
  Filled 2018-07-11: qty 1

## 2018-07-11 MED ORDER — HYDROCODONE-ACETAMINOPHEN 7.5-325 MG PO TABS
1.0000 | ORAL_TABLET | Freq: Once | ORAL | Status: AC | PRN
  Administered 2018-07-11: 1 via ORAL
  Filled 2018-07-11: qty 1

## 2018-07-11 MED ORDER — DEXTROSE 5 % IV SOLN
3.0000 g | INTRAVENOUS | Status: AC
  Administered 2018-07-11: 3 g via INTRAVENOUS
  Filled 2018-07-11: qty 3000

## 2018-07-11 MED ORDER — ACETAMINOPHEN 10 MG/ML IV SOLN
INTRAVENOUS | Status: AC
  Filled 2018-07-11: qty 100

## 2018-07-11 MED ORDER — HYDROMORPHONE HCL 1 MG/ML IJ SOLN
INTRAMUSCULAR | Status: AC
  Administered 2018-07-11: 0.5 mg via INTRAVENOUS
  Filled 2018-07-11: qty 1

## 2018-07-11 MED ORDER — ACETAMINOPHEN 160 MG/5ML PO SOLN
325.0000 mg | ORAL | Status: DC | PRN
  Filled 2018-07-11: qty 20.3

## 2018-07-11 MED ORDER — KETAMINE HCL 10 MG/ML IJ SOLN
INTRAMUSCULAR | Status: DC | PRN
  Administered 2018-07-11: 30 mg via INTRAVENOUS
  Administered 2018-07-11: 20 mg via INTRAVENOUS

## 2018-07-11 MED ORDER — FENTANYL CITRATE (PF) 100 MCG/2ML IJ SOLN
INTRAMUSCULAR | Status: DC | PRN
  Administered 2018-07-11: 50 ug via INTRAVENOUS
  Administered 2018-07-11: 100 ug via INTRAVENOUS
  Administered 2018-07-11: 150 ug via INTRAVENOUS
  Administered 2018-07-11: 50 ug via INTRAVENOUS

## 2018-07-11 MED ORDER — FENTANYL CITRATE (PF) 250 MCG/5ML IJ SOLN
INTRAMUSCULAR | Status: AC
  Filled 2018-07-11: qty 5

## 2018-07-11 MED ORDER — ROCURONIUM BROMIDE 50 MG/5ML IV SOLN
INTRAVENOUS | Status: AC
  Filled 2018-07-11: qty 1

## 2018-07-11 MED ORDER — LIDOCAINE HCL (CARDIAC) PF 100 MG/5ML IV SOSY
PREFILLED_SYRINGE | INTRAVENOUS | Status: DC | PRN
  Administered 2018-07-11: 100 mg via INTRAVENOUS

## 2018-07-11 MED ORDER — GABAPENTIN 300 MG PO CAPS
300.0000 mg | ORAL_CAPSULE | ORAL | Status: AC
  Administered 2018-07-11: 300 mg via ORAL

## 2018-07-11 MED ORDER — HYDROMORPHONE HCL 1 MG/ML IJ SOLN
0.2500 mg | INTRAMUSCULAR | Status: DC | PRN
  Administered 2018-07-11 (×4): 0.5 mg via INTRAVENOUS

## 2018-07-11 MED ORDER — OXYCODONE-ACETAMINOPHEN 5-325 MG PO TABS
1.0000 | ORAL_TABLET | ORAL | 0 refills | Status: DC | PRN
Start: 2018-07-11 — End: 2018-07-19

## 2018-07-11 MED ORDER — HYDROCODONE-ACETAMINOPHEN 7.5-325 MG PO TABS
ORAL_TABLET | ORAL | Status: AC
  Filled 2018-07-11: qty 1

## 2018-07-11 MED ORDER — CELECOXIB 200 MG PO CAPS
200.0000 mg | ORAL_CAPSULE | ORAL | Status: AC
  Administered 2018-07-11: 200 mg via ORAL

## 2018-07-11 MED ORDER — BUPIVACAINE-EPINEPHRINE (PF) 0.5% -1:200000 IJ SOLN
INTRAMUSCULAR | Status: DC | PRN
  Administered 2018-07-11: 20 mL via PERINEURAL

## 2018-07-11 MED ORDER — CELECOXIB 200 MG PO CAPS
ORAL_CAPSULE | ORAL | Status: AC
  Administered 2018-07-11: 200 mg via ORAL
  Filled 2018-07-11: qty 1

## 2018-07-11 MED ORDER — PROMETHAZINE HCL 25 MG/ML IJ SOLN
6.2500 mg | INTRAMUSCULAR | Status: DC | PRN
Start: 2018-07-11 — End: 2018-07-11
  Administered 2018-07-11: 12.5 mg via INTRAVENOUS

## 2018-07-11 MED ORDER — EPHEDRINE SULFATE 50 MG/ML IJ SOLN
INTRAMUSCULAR | Status: DC | PRN
  Administered 2018-07-11: 10 mg via INTRAVENOUS

## 2018-07-11 MED ORDER — LIDOCAINE HCL (PF) 2 % IJ SOLN
INTRAMUSCULAR | Status: AC
  Filled 2018-07-11: qty 10

## 2018-07-11 MED ORDER — SUGAMMADEX SODIUM 200 MG/2ML IV SOLN
INTRAVENOUS | Status: AC
  Filled 2018-07-11: qty 4

## 2018-07-11 MED ORDER — SUGAMMADEX SODIUM 200 MG/2ML IV SOLN
INTRAVENOUS | Status: DC | PRN
  Administered 2018-07-11: 250 mg via INTRAVENOUS

## 2018-07-11 MED ORDER — ONDANSETRON HCL 4 MG/2ML IJ SOLN
INTRAMUSCULAR | Status: AC
Start: 2018-07-11 — End: ?
  Filled 2018-07-11: qty 2

## 2018-07-11 MED ORDER — ROCURONIUM BROMIDE 100 MG/10ML IV SOLN
INTRAVENOUS | Status: DC | PRN
  Administered 2018-07-11: 10 mg via INTRAVENOUS
  Administered 2018-07-11: 50 mg via INTRAVENOUS
  Administered 2018-07-11 (×2): 10 mg via INTRAVENOUS

## 2018-07-11 MED ORDER — PHENYLEPHRINE HCL 10 MG/ML IJ SOLN
INTRAMUSCULAR | Status: AC
  Filled 2018-07-11: qty 1

## 2018-07-11 MED ORDER — MEPERIDINE HCL 50 MG/ML IJ SOLN: 6.2500 mg | INTRAMUSCULAR | Status: DC | PRN

## 2018-07-11 MED ORDER — PROPOFOL 10 MG/ML IV BOLUS
INTRAVENOUS | Status: DC | PRN
  Administered 2018-07-11: 200 mg via INTRAVENOUS

## 2018-07-11 MED ORDER — PROPOFOL 10 MG/ML IV BOLUS
INTRAVENOUS | Status: AC
  Filled 2018-07-11: qty 40

## 2018-07-11 MED ORDER — KETOROLAC TROMETHAMINE 30 MG/ML IJ SOLN
INTRAMUSCULAR | Status: DC | PRN
  Administered 2018-07-11: 30 mg via INTRAVENOUS

## 2018-07-11 MED ORDER — GLYCOPYRROLATE 0.2 MG/ML IJ SOLN
INTRAMUSCULAR | Status: DC | PRN
  Administered 2018-07-11: 0.1 mg via INTRAVENOUS

## 2018-07-11 MED ORDER — GLYCOPYRROLATE 0.2 MG/ML IJ SOLN
INTRAMUSCULAR | Status: AC
Start: 2018-07-11 — End: ?
  Filled 2018-07-11: qty 1

## 2018-07-11 MED ORDER — ACETAMINOPHEN 10 MG/ML IV SOLN
INTRAVENOUS | Status: DC | PRN
  Administered 2018-07-11: 1000 mg via INTRAVENOUS

## 2018-07-11 MED ORDER — LACTATED RINGERS IV SOLN
INTRAVENOUS | Status: DC
  Administered 2018-07-11: 13:00:00 via INTRAVENOUS

## 2018-07-11 MED ORDER — PHENYLEPHRINE HCL 10 MG/ML IJ SOLN
INTRAMUSCULAR | Status: DC | PRN
  Administered 2018-07-11: 200 ug via INTRAVENOUS
  Administered 2018-07-11 (×2): 100 ug via INTRAVENOUS
  Administered 2018-07-11: 200 ug via INTRAVENOUS

## 2018-07-11 MED ORDER — BUPIVACAINE-EPINEPHRINE (PF) 0.5% -1:200000 IJ SOLN
INTRAMUSCULAR | Status: AC
  Filled 2018-07-11: qty 30

## 2018-07-11 MED ORDER — MIDAZOLAM HCL 2 MG/2ML IJ SOLN
INTRAMUSCULAR | Status: DC | PRN
  Administered 2018-07-11: 2 mg via INTRAVENOUS

## 2018-07-11 MED ORDER — DEXAMETHASONE SODIUM PHOSPHATE 10 MG/ML IJ SOLN
INTRAMUSCULAR | Status: DC | PRN
  Administered 2018-07-11: 10 mg via INTRAVENOUS

## 2018-07-11 MED ORDER — ONDANSETRON HCL 4 MG/2ML IJ SOLN
INTRAMUSCULAR | Status: DC | PRN
  Administered 2018-07-11: 4 mg via INTRAVENOUS

## 2018-07-11 MED ORDER — MIDAZOLAM HCL 2 MG/2ML IJ SOLN
INTRAMUSCULAR | Status: AC
  Filled 2018-07-11: qty 2

## 2018-07-11 MED ORDER — GABAPENTIN 300 MG PO CAPS
ORAL_CAPSULE | ORAL | Status: AC
  Administered 2018-07-11: 300 mg via ORAL
  Filled 2018-07-11: qty 1

## 2018-07-11 MED ORDER — SODIUM CHLORIDE 0.9 % IJ SOLN
INTRAMUSCULAR | Status: AC
  Filled 2018-07-11: qty 10

## 2018-07-11 MED ORDER — PROMETHAZINE HCL 25 MG/ML IJ SOLN
INTRAMUSCULAR | Status: AC
  Administered 2018-07-11: 12.5 mg via INTRAVENOUS
  Filled 2018-07-11: qty 1

## 2018-07-11 SURGICAL SUPPLY — 46 items
BINDER ABDOMINAL 12 ML 46-62 (SOFTGOODS) ×2 IMPLANT
BLADE SURG 11 STRL SS SAFETY (MISCELLANEOUS) ×2 IMPLANT
CANISTER SUCT 1200ML W/VALVE (MISCELLANEOUS) ×2 IMPLANT
CANNULA DILATOR 10 W/SLV (CANNULA) ×4 IMPLANT
CANNULA DILATOR 5 W/SLV (CANNULA) ×2 IMPLANT
CHLORAPREP W/TINT 26ML (MISCELLANEOUS) ×2 IMPLANT
CORD SCALPEL HARMONIC (MISCELLANEOUS) ×1 IMPLANT
DEVICE SECURE STRAP 25 ABSORB (INSTRUMENTS) ×4 IMPLANT
DISSECTOR KITTNER STICK (MISCELLANEOUS) ×1 IMPLANT
DISSECTORS/KITTNER STICK (MISCELLANEOUS) ×2
DRSG TEGADERM 2-3/8X2-3/4 SM (GAUZE/BANDAGES/DRESSINGS) ×6 IMPLANT
DRSG TELFA 3X8 NADH (GAUZE/BANDAGES/DRESSINGS) ×4 IMPLANT
DRSG TELFA 4X3 1S NADH ST (GAUZE/BANDAGES/DRESSINGS) ×2 IMPLANT
ELECT REM PT RETURN 9FT ADLT (ELECTROSURGICAL) ×2
ELECTRODE REM PT RTRN 9FT ADLT (ELECTROSURGICAL) ×1 IMPLANT
GLOVE BIO SURGEON STRL SZ7.5 (GLOVE) ×4 IMPLANT
GLOVE INDICATOR 8.0 STRL GRN (GLOVE) ×2 IMPLANT
GOWN STRL REUS W/ TWL LRG LVL3 (GOWN DISPOSABLE) ×2 IMPLANT
GOWN STRL REUS W/TWL LRG LVL3 (GOWN DISPOSABLE) ×2
GRASPER SUT TROCAR 14GX15 (MISCELLANEOUS) ×2 IMPLANT
HARMONIC SCALPEL CORD (MISCELLANEOUS) ×2
IRRIGATION STRYKERFLOW (MISCELLANEOUS) IMPLANT
IRRIGATOR STRYKERFLOW (MISCELLANEOUS)
IV LACTATED RINGERS 1000ML (IV SOLUTION) ×2 IMPLANT
KIT TURNOVER KIT A (KITS) ×2 IMPLANT
LABEL OR SOLS (LABEL) ×2 IMPLANT
LIGASURE LAP MARYLAND 5MM 37CM (ELECTROSURGICAL) ×2 IMPLANT
MESH VENTRALIGHT ST 6X8 (Mesh General) ×1 IMPLANT
MESH VENTRLGHT ELIP 8X6XMFL (Mesh General) ×1 IMPLANT
NDL INSUFF ACCESS 14 VERSASTEP (NEEDLE) ×2 IMPLANT
NS IRRIG 500ML POUR BTL (IV SOLUTION) ×2 IMPLANT
PACK LAP CHOLECYSTECTOMY (MISCELLANEOUS) ×4 IMPLANT
PAD ABD DERMACEA PRESS 5X9 (GAUZE/BANDAGES/DRESSINGS) ×4 IMPLANT
SCISSORS METZENBAUM CVD 33 (INSTRUMENTS) ×2 IMPLANT
SEAL FOR SCOPE WARMER C3101 (MISCELLANEOUS) ×2 IMPLANT
SHEARS HARMONIC ACE PLUS 36CM (ENDOMECHANICALS) ×2 IMPLANT
STRIP CLOSURE SKIN 1/2X4 (GAUZE/BANDAGES/DRESSINGS) ×2 IMPLANT
SUT MAXON 0 T 12 3 (SUTURE) ×4 IMPLANT
SUT PDS PLUS 0 (SUTURE)
SUT PDS PLUS AB 0 CT-2 (SUTURE) IMPLANT
SUT VIC AB 0 SH 27 (SUTURE) ×2 IMPLANT
SUT VIC AB 4-0 FS2 27 (SUTURE) ×2 IMPLANT
SWABSTK COMLB BENZOIN TINCTURE (MISCELLANEOUS) ×2 IMPLANT
TRAY FOLEY MTR SLVR 16FR STAT (SET/KITS/TRAYS/PACK) ×2 IMPLANT
TROCAR XCEL UNIV SLVE 11M 100M (ENDOMECHANICALS) ×2 IMPLANT
TUBING INSUFFLATION (TUBING) ×2 IMPLANT

## 2018-07-11 NOTE — Op Note (Signed)
Preoperative diagnosis: Recurrent ventral hernia.  Postoperative diagnosis: Same.  Operative procedure: Laparoscopic ventral hernia repair with 6 x 8 inch ventral light mesh.  Operating Surgeon: Hervey Ard, MD.  Anesthesia: General endotracheal, Marcaine 0.5% with 1 to 200,000 units of epinephrine, 20 cc.  Estimated blood loss: Less than 5 cc.  Clinical note: This 52 year old male underwent cholecystectomy about 18 months ago and developed a hernia at the umbilical site.  Approximately 13 months ago he underwent repair with placement of a 6 cm Ventralex intraperitoneal mesh followed by closure of the 4 cm transversely orientated defect.  The patient has developed a recurrent hernia and is admitted for elective repair.  The patient received Kefzol prior to the procedure.  SCD stockings for DVT prevention.  Hair was removed from the abdomen with clippers prior to presentation of the operating theater.  Operative note: The patient underwent general endotracheal anesthesia without difficulty.  An orogastric tube was passed by the nurse anesthetist.  A site of entry in was chosen below the costal margin in the left upper quadrant just outside the midclavicular line.  A varies needle was placed.  After assuring intra-abdominal location with a hanging drop test the abdomen was insufflated with CO2 of 10 mmHg pressure.  A 5 mm Step port was expanded in inspection showed no evidence of injury from initial port placement.  A 12 mm XL port was placed along the anterior axillary line at the level of the umbilicus and a 5 mm Step port was placed just outside the midclavicular line approximately 4 fingerbreadths below this.  There was noted to be areas of adhesion of the omentum to the previously placed mesh as well as 2 loops of small bowel.  The omentum was taken down with careful cautery dissection.  The small bowel loops were treated with gentle sharp and Kitner dissection with separation from the mesh  without injury to the serosa.  The mesh appeared firmly adherent from the lateral surfaces to the caudal margin.  Superiorly there measured a 4 cm defect.  There was a 2 cm nodular area of compressed mesh and this was sharply excised.  The remaining mesh was left in place.  It was elected to use a 6 x 8 inch ventralight  mesh with the echo balloon delivery system. The abdomen had been desufflated prior to mesh insertion and the sites for transfixion in the decompressed state were marked.  After reestablishing pneumoperitoneum the mesh was smooth and the inflation tube was brought through a puncture wound on the midportion of the defect.  The balloon was inflated and the mesh was brought up against the abdominal wall.  The previously placed 0 Maxon sutures were then tied after being passed trans-fascially with the PMI suture passer.  Once the 4 corners of the mesh had been fixated the Ethicon secure strap tacker was used to place multiple concentric rings beginning at the periphery of the mesh and extending anteriorly to provide good adherence of the mesh to the anterior abdominal wall.  Reinspection of the small bowel that had been involved at the edge of the hernia should again showed no evidence of injury.  No bleeding points were identified.  The 12 mm port site was closed with a single 0 Maxon suture passed through a "cone" with the PMI suture passer.  The skin incisions were closed with 4-0 Vicryl subarticular sutures.  Benzoin, Steri-Strips and Telfa and Tegaderm dressings were placed over the working port sites.  Benzoin Steri-Strips were  placed to the trans-fascial suture sites.  Telfa pads were placed over the sutures passage site followed by ABD pads and a abdominal binder.    The patient had a very gentle extubation and was transported to the recovery room in stable condition.

## 2018-07-11 NOTE — OR Nursing (Signed)
The green postioning system to Ventralight mesh removed intacted by Dr. Bary Castilla

## 2018-07-11 NOTE — Transfer of Care (Signed)
Immediate Anesthesia Transfer of Care Note  Patient: Johnny Rios  Procedure(s) Performed: LAPAROSCOPIC VENTRAL HERNIA (N/A Abdomen)  Patient Location: PACU  Anesthesia Type:General  Level of Consciousness: sedated and responds to stimulation  Airway & Oxygen Therapy: Patient Spontanous Breathing and Patient connected to face mask oxygen  Post-op Assessment: Report given to RN and Post -op Vital signs reviewed and stable  Post vital signs: Reviewed and stable  Last Vitals:  Vitals Value Taken Time  BP 124/95 07/11/2018  3:58 PM  Temp 37 C 07/11/2018  3:56 PM  Pulse 101 07/11/2018  3:58 PM  Resp 14 07/11/2018  3:58 PM  SpO2 97 % 07/11/2018  3:58 PM    Last Pain:  Vitals:   07/11/18 1237  TempSrc: Temporal  PainSc: 0-No pain         Complications: No apparent anesthesia complications

## 2018-07-11 NOTE — OR Nursing (Signed)
Awakened on arrival to postop (phenergan given by PACU nurse), to chair with assistance, toler well, back to sleep.

## 2018-07-11 NOTE — Anesthesia Post-op Follow-up Note (Signed)
Anesthesia QCDR form completed.        

## 2018-07-11 NOTE — Anesthesia Postprocedure Evaluation (Signed)
Anesthesia Post Note  Patient: Johnny Rios  Procedure(s) Performed: LAPAROSCOPIC VENTRAL HERNIA (N/A Abdomen)  Patient location during evaluation: PACU Anesthesia Type: General Level of consciousness: awake and alert Pain management: pain level controlled Vital Signs Assessment: post-procedure vital signs reviewed and stable Respiratory status: spontaneous breathing, nonlabored ventilation, respiratory function stable and patient connected to nasal cannula oxygen Cardiovascular status: blood pressure returned to baseline and stable Postop Assessment: no apparent nausea or vomiting Anesthetic complications: no     Last Vitals:  Vitals:   07/11/18 1757 07/11/18 1834  BP:  117/86  Pulse:  90  Resp: 18 16  Temp: (!) 36.1 C   SpO2: 99% 99%    Last Pain:  Vitals:   07/11/18 1757  TempSrc: Temporal  PainSc: 2                  Molli Barrows

## 2018-07-11 NOTE — Anesthesia Procedure Notes (Signed)
Procedure Name: Intubation Date/Time: 07/11/2018 1:44 PM Performed by: Nile Riggs, CRNA Pre-anesthesia Checklist: Patient identified, Emergency Drugs available, Suction available, Patient being monitored and Timeout performed Patient Re-evaluated:Patient Re-evaluated prior to induction Oxygen Delivery Method: Circle system utilized Preoxygenation: Pre-oxygenation with 100% oxygen Induction Type: IV induction and Cricoid Pressure applied Ventilation: Mask ventilation without difficulty and Oral airway inserted - appropriate to patient size Laryngoscope Size: Sabra Heck and 3 Grade View: Grade I Tube type: Oral Number of attempts: 1 Airway Equipment and Method: Stylet Placement Confirmation: ETT inserted through vocal cords under direct vision,  positive ETCO2,  CO2 detector and breath sounds checked- equal and bilateral Secured at: 22 cm Tube secured with: Tape Dental Injury: Teeth and Oropharynx as per pre-operative assessment

## 2018-07-11 NOTE — OR Nursing (Signed)
Awake and alert, taking po's.  Per Dr. Bary Castilla via tele 1800, may discharge patient to home without his visit.

## 2018-07-11 NOTE — Discharge Instructions (Signed)

## 2018-07-11 NOTE — Anesthesia Preprocedure Evaluation (Signed)
Anesthesia Evaluation  Patient identified by MRN, date of birth, ID band Patient awake    Reviewed: Allergy & Precautions, H&P , NPO status , reviewed documented beta blocker date and time   Airway Mallampati: III  TM Distance: >3 FB Neck ROM: limited    Dental  (+) Poor Dentition, Chipped, Missing   Pulmonary sleep apnea and Continuous Positive Airway Pressure Ventilation ,    Pulmonary exam normal        Cardiovascular Normal cardiovascular exam     Neuro/Psych Anxiety  Neuromuscular disease    GI/Hepatic PUD, GERD  Medicated and Controlled,  Endo/Other    Renal/GU      Musculoskeletal  (+) Arthritis ,   Abdominal   Peds  Hematology   Anesthesia Other Findings Past Medical History: No date: Anxiety No date: Arthritis No date: Cancer [facility])     Comment:  SKIN CANCER No date: GERD (gastroesophageal reflux disease) No date: Sleep apnea     Comment:  Uses CPAP No date: Tachycardia Past Surgical History: No date: APPENDECTOMY 01/19/2017: CHOLECYSTECTOMY; N/A     Comment:  Procedure: LAPAROSCOPIC CHOLECYSTECTOMY WITH               INTRAOPERATIVE CHOLANGIOGRAM;  Surgeon: Robert Bellow, MD;  Location: [facility] ORS;  Service: General;                Laterality: N/A; No date: TONSILLECTOMY 5/59/7416: UMBILICAL HERNIA REPAIR; N/A     Comment:  Ventral hernia with 6 cm Ventralex ST mesh. BMI    Body Mass Index:  37.74 kg/m     Reproductive/Obstetrics                             Anesthesia Physical Anesthesia Plan  ASA: III  Anesthesia Plan: General ETT   Post-op Pain Management:    Induction: Intravenous  PONV Risk Score and Plan: Ondansetron, Treatment may vary due to age or medical condition, Midazolam, Dexamethasone and Metaclopromide  Airway Management Planned:   Additional Equipment:   Intra-op Plan:   Post-operative Plan: Extubation in OR  Informed  Consent: I have reviewed the patients History and Physical, chart, labs and discussed the procedure including the risks, benefits and alternatives for the proposed anesthesia with the patient or authorized representative who has indicated his/her understanding and acceptance.   Dental Advisory Given  Plan Discussed with: CRNA  Anesthesia Plan Comments:         Anesthesia Quick Evaluation

## 2018-07-11 NOTE — H&P (Signed)
No change in clinical condition or exam.  For ventral hernia repair.  

## 2018-07-12 ENCOUNTER — Encounter: Payer: Self-pay | Admitting: General Surgery

## 2018-07-15 ENCOUNTER — Encounter: Payer: Self-pay | Admitting: Emergency Medicine

## 2018-07-15 DIAGNOSIS — Z79899 Other long term (current) drug therapy: Secondary | ICD-10-CM | POA: Insufficient documentation

## 2018-07-15 DIAGNOSIS — R0789 Other chest pain: Secondary | ICD-10-CM | POA: Diagnosis not present

## 2018-07-15 DIAGNOSIS — R079 Chest pain, unspecified: Secondary | ICD-10-CM | POA: Diagnosis present

## 2018-07-15 NOTE — ED Triage Notes (Addendum)
Patient with complaint of chest pain, shortness of breath and dizziness that started about 18:00 today. Patient states that his blood pressure at home was 159/100. Patient states that he had a hernia repair on Wednesday and has not felt good since.

## 2018-07-16 ENCOUNTER — Emergency Department: Payer: BLUE CROSS/BLUE SHIELD

## 2018-07-16 ENCOUNTER — Emergency Department
Admission: EM | Admit: 2018-07-16 | Discharge: 2018-07-16 | Disposition: A | Discharge status: Home / Self Care | Payer: BLUE CROSS/BLUE SHIELD | Attending: Emergency Medicine | Admitting: Emergency Medicine

## 2018-07-16 DIAGNOSIS — R0789 Other chest pain: Secondary | ICD-10-CM

## 2018-07-16 LAB — URINALYSIS, COMPLETE (UACMP) WITH MICROSCOPIC
BILIRUBIN URINE: NEGATIVE
Bacteria, UA: NONE SEEN
GLUCOSE, UA: NEGATIVE mg/dL
HGB URINE DIPSTICK: NEGATIVE
Ketones, ur: NEGATIVE mg/dL
Leukocytes, UA: NEGATIVE
Nitrite: NEGATIVE
PH: 7 (ref 5.0–8.0)
Protein, ur: NEGATIVE mg/dL
SPECIFIC GRAVITY, URINE: 1.01 (ref 1.005–1.030)
Squamous Epithelial / LPF: NONE SEEN (ref 0–5)

## 2018-07-16 LAB — CBC
HCT: 38.1 % — ABNORMAL LOW (ref 40.0–52.0)
HEMOGLOBIN: 12.8 g/dL — AB (ref 13.0–18.0)
MCH: 25.1 pg — AB (ref 26.0–34.0)
MCHC: 33.6 g/dL (ref 32.0–36.0)
MCV: 74.8 fL — AB (ref 80.0–100.0)
Platelets: 357 10*3/uL (ref 150–440)
RBC: 5.09 MIL/uL (ref 4.40–5.90)
RDW: 14.9 % — ABNORMAL HIGH (ref 11.5–14.5)
WBC: 10.7 10*3/uL — ABNORMAL HIGH (ref 3.8–10.6)

## 2018-07-16 LAB — BASIC METABOLIC PANEL
ANION GAP: 9 (ref 5–15)
BUN: 11 mg/dL (ref 6–20)
CO2: 26 mmol/L (ref 22–32)
Calcium: 9.8 mg/dL (ref 8.9–10.3)
Chloride: 103 mmol/L (ref 98–111)
Creatinine, Ser: 0.86 mg/dL (ref 0.61–1.24)
GFR calc non Af Amer: >60 mL/min (ref >60)
Glucose, Bld: 153 mg/dL — ABNORMAL HIGH (ref 70–99)
POTASSIUM: 3.8 mmol/L (ref 3.5–5.1)
Sodium: 138 mmol/L (ref 135–145)

## 2018-07-16 LAB — TROPONIN I: Troponin I: <0.03 ng/mL (ref <0.03)

## 2018-07-16 NOTE — Discharge Instructions (Signed)
It was a pleasure to take care of you today, and thank you for coming to our emergency department.  If you have any questions or concerns before leaving please ask the nurse to grab me and I'm more than happy to go through your aftercare instructions again.  If you were prescribed any opioid pain medication today such as Norco, Vicodin, Percocet, morphine, hydrocodone, or oxycodone please make sure you do not drive when you are taking this medication as it can alter your ability to drive safely.  If you have any concerns once you are home that you are not improving or are in fact getting worse before you can make it to your follow-up appointment, please do not hesitate to call 911 and come back for further evaluation.  Darel Hong, MD  Results for orders placed or performed during the hospital encounter of 09/32/35  Basic metabolic panel  Result Value Ref Range   Sodium 138 135 - 145 mmol/L   Potassium 3.8 3.5 - 5.1 mmol/L   Chloride 103 98 - 111 mmol/L   CO2 26 22 - 32 mmol/L   Glucose, Bld 153 (H) 70 - 99 mg/dL   BUN 11 6 - 20 mg/dL   Creatinine, Ser 0.86 0.61 - 1.24 mg/dL   Calcium 9.8 8.9 - 10.3 mg/dL   GFR calc non Af Amer >60 >60 mL/min   GFR calc Af Amer >60 >60 mL/min   Anion gap 9 5 - 15  CBC  Result Value Ref Range   WBC 10.7 (H) 3.8 - 10.6 K/uL   RBC 5.09 4.40 - 5.90 MIL/uL   Hemoglobin 12.8 (L) 13.0 - 18.0 g/dL   HCT 38.1 (L) 40.0 - 52.0 %   MCV 74.8 (L) 80.0 - 100.0 fL   MCH 25.1 (L) 26.0 - 34.0 pg   MCHC 33.6 32.0 - 36.0 g/dL   RDW 14.9 (H) 11.5 - 14.5 %   Platelets 357 150 - 440 K/uL  Troponin I  Result Value Ref Range   Troponin I <0.03 <0.03 ng/mL  Urinalysis, Complete w Microscopic  Result Value Ref Range   Color, Urine STRAW (A) YELLOW   APPearance CLEAR (A) CLEAR   Specific Gravity, Urine 1.010 1.005 - 1.030   pH 7.0 5.0 - 8.0   Glucose, UA NEGATIVE NEGATIVE mg/dL   Hgb urine dipstick NEGATIVE NEGATIVE   Bilirubin Urine NEGATIVE NEGATIVE   Ketones,  ur NEGATIVE NEGATIVE mg/dL   Protein, ur NEGATIVE NEGATIVE mg/dL   Nitrite NEGATIVE NEGATIVE   Leukocytes, UA NEGATIVE NEGATIVE   WBC, UA 0-5 0 - 5 WBC/hpf   Bacteria, UA NONE SEEN NONE SEEN   Squamous Epithelial / LPF NONE SEEN 0 - 5   Dg Chest 2 View  Result Date: 07/16/2018 CLINICAL DATA:  Chest pain EXAM: CHEST - 2 VIEW COMPARISON:  09/24/2017 FINDINGS: The heart size and mediastinal contours are within normal limits. Both lungs are clear. Degenerative changes of the spine. IMPRESSION: No active cardiopulmonary disease. Electronically Signed   By: Donavan Foil M.D.   On: 07/16/2018 00:29

## 2018-07-16 NOTE — ED Provider Notes (Signed)
[facility] Emergency Department Provider Note  ____________________________________________   First MD Initiated Contact with Patient 07/16/18 [phone]     (approximate)  I have reviewed the triage vital signs and the nursing notes.   HISTORY  Chief Complaint Chest Pain   HPI Johnny Rios is a 52 y.o. male self presents to the emergency department with a 15-minute episode of epigastric and lower chest discomfort that began at 6 PM.  The pain came on suddenly was aching and "full" and resolved on its own without any medications.  It is not recurred.  The pain was nonradiating.  Some nausea no vomiting.  No diaphoresis.  He has no history of coronary artery disease.  No recent surgery travel or immobilization.  No leg swelling.  No cough or shortness of breath.    Past Medical History:  Diagnosis Date  . Anxiety   . Arthritis   . Cancer [facility])    SKIN CANCER  . GERD (gastroesophageal reflux disease)   . Sleep apnea    Uses CPAP  . Tachycardia     Patient Active Problem List   Diagnosis Date Noted  . Recurrent ventral hernia 07/05/2018  . Rectus diastasis 07/05/2018  . DDD (degenerative disc disease), lumbar 04/16/2018  . Low testosterone 10/09/2017  . Elevated glucose 09/26/2017  . Mixed hyperlipidemia 09/26/2017  . Chest pain 07/26/2017  . Chronic fatigue 07/26/2017  . Umbilical hernia without obstruction and without gangrene 06/05/2017  . Gallstones 01/04/2017  . PUD (peptic ulcer disease) 10/31/2016  . Obesity 12/02/2015  . Tachycardia 12/02/2015  . OSA on CPAP 12/02/2015  . Anxiety     Past Surgical History:  Procedure Laterality Date  . APPENDECTOMY    . CHOLECYSTECTOMY N/A 01/19/2017   Procedure: LAPAROSCOPIC CHOLECYSTECTOMY WITH INTRAOPERATIVE CHOLANGIOGRAM;  Surgeon: Robert Bellow, MD;  Location: [facility] ORS;  Service: General;  Laterality: N/A;  . TONSILLECTOMY    . UMBILICAL HERNIA REPAIR N/A 06/12/2017   Ventral hernia with 6  cm Ventralex ST mesh.  . VENTRAL HERNIA REPAIR N/A 07/11/2018   Procedure: LAPAROSCOPIC VENTRAL HERNIA;  Surgeon: Robert Bellow, MD;  Location: [facility] ORS;  Service: General;  Laterality: N/A;    Prior to Admission medications   Medication Sig Start Date End Date Taking? Authorizing Provider  clonazePAM (KLONOPIN) 1 MG tablet Take 1 tablet (1 mg total) by mouth daily as needed for anxiety. Patient taking differently: Take 1 mg by mouth every morning.  04/16/18   Guadalupe Maple, MD  esomeprazole (NEXIUM) 20 MG capsule Take 1 capsule  by mouth every morning. 05/24/18   Guadalupe Maple, MD  meloxicam (MOBIC) 15 MG tablet Take 1 tablet (15 mg total) by mouth daily. 04/16/18   Guadalupe Maple, MD  Multiple Vitamins-Minerals (MULTIVITAMIN PO) Take 1 tablet by mouth daily.    [provider]  oxyCODONE-acetaminophen (PERCOCET) 5-325 MG tablet Take 1 tablet by mouth every 4 (four) hours as needed for severe pain. 07/11/18 07/11/19  Robert Bellow, MD    Allergies Patient has no known allergies.  Family History  Problem Relation Age of Onset  . Cancer Father        lung  . Alzheimer's disease Mother   . Heart attack Brother   . Heart attack Brother     Social History Social History   Tobacco Use  . Smoking status: Never Smoker  . Smokeless tobacco: Former Systems developer    Types: Chew  Substance Use Topics  .  Alcohol use: Yes    Comment: occasional  . Drug use: No    Review of Systems Constitutional: No fever/chills Eyes: No visual changes. ENT: No sore throat. Cardiovascular: Positive for chest pain. Respiratory: Denies shortness of breath. Gastrointestinal: Positive for abdominal pain.  No nausea, no vomiting.  No diarrhea.  No constipation. Genitourinary: Negative for dysuria. Musculoskeletal: Negative for back pain. Skin: Negative for rash. Neurological: Negative for headaches, focal weakness or numbness.   ____________________________________________   PHYSICAL  EXAM:  VITAL SIGNS: ED Triage Vitals  Enc Vitals Group     BP 07/15/18 2357 (!) 139/103     Pulse Rate 07/15/18 2357 (!) 112     Resp 07/15/18 2357 18     Temp 07/15/18 2357 98.2 F (36.8 C)     Temp Source 07/15/18 2357 Oral     SpO2 07/15/18 2357 97 %     Weight 07/15/18 2358 263 lb (119.3 kg)     Height 07/15/18 2358 5\' 10"  (1.778 m)     Head Circumference --      Peak Flow --      Pain Score 07/15/18 2358 0     Pain Loc --      Pain Edu? --      Excl. in [place]? --     Constitutional: Alert and oriented x4 well-appearing nontoxic no diaphoresis speaks full clear sentences Eyes: PERRL EOMI. Head: Atraumatic. Nose: No congestion/rhinnorhea. Mouth/Throat: No trismus Neck: No stridor.   Cardiovascular: Normal rate, regular rhythm. Grossly normal heart sounds.  Good peripheral circulation. Respiratory: Normal respiratory effort.  No retractions. Lungs CTAB and moving good air Gastrointestinal: Soft nontender Musculoskeletal: No lower extremity edema   Neurologic:  Normal speech and language. No gross focal neurologic deficits are appreciated. Skin:  Skin is warm, dry and intact. No rash noted. Psychiatric: Mood and affect are normal. Speech and behavior are normal.    ____________________________________________   DIFFERENTIAL includes but not limited to  Gastritis, gastric reflux, pancreatitis, acute coronary syndrome, pulmonary embolism ____________________________________________   LABS (all labs ordered are listed, but only abnormal results are displayed)  Labs Reviewed  BASIC METABOLIC PANEL - Abnormal; Notable for the following components:      Result Value   Glucose, Bld 153 (*)    All other components within normal limits  CBC - Abnormal; Notable for the following components:   WBC 10.7 (*)    Hemoglobin 12.8 (*)    HCT 38.1 (*)    MCV 74.8 (*)    MCH 25.1 (*)    RDW 14.9 (*)    All other components within normal limits  URINALYSIS, COMPLETE (UACMP)  WITH MICROSCOPIC - Abnormal; Notable for the following components:   Color, Urine STRAW (*)    APPearance CLEAR (*)    All other components within normal limits  TROPONIN I    Lab work reviewed by me with no acute disease noted __________________________________________  EKG  ED ECG REPORT I, Darel Hong, the attending physician, personally viewed and interpreted this ECG.  Date: 07/16/2018 EKG Time:  Rate: 112 Rhythm: Tachycardia QRS Axis: normal Intervals: normal ST/T Wave abnormalities: S1Q3T3 which is consistent with old Narrative Interpretation: no evidence of acute ischemia  ____________________________________________  RADIOLOGY  Chest x-ray reviewed by me with no acute disease ____________________________________________   PROCEDURES  Procedure(s) performed: no  Procedures  Critical Care performed: no  ____________________________________________   INITIAL IMPRESSION / ASSESSMENT AND PLAN / ED COURSE  Pertinent labs & imaging  results that were available during my care of the patient were reviewed by me and considered in my medical decision making (see chart for details).   By the time I saw the patient he was pain-free and asking to go home.  EKG nonischemic and consistent with previous.  His story is not consistent with acute coronary syndrome.  He is discharged home with primary care follow-up and strict return precautions.      ____________________________________________   FINAL CLINICAL IMPRESSION(S) / ED DIAGNOSES  Final diagnoses:  Atypical chest pain      NEW MEDICATIONS STARTED DURING THIS VISIT:  Discharge Medication List as of 07/16/2018  4:11 AM       Note:  This document was prepared using Dragon voice recognition software and may include unintentional dictation errors.     Darel Hong, MD 07/16/18 [phone]

## 2018-07-16 NOTE — ED Notes (Signed)
Patient given update on wait time. Patient verbalizes understanding. Patient in no acute distress at this time.

## 2018-07-16 NOTE — ED Notes (Signed)
Patient sitting in wheelchair in lobby in no acute distress.

## 2018-07-16 NOTE — ED Notes (Signed)
Patient updated on wait time. Patient verbalizes understanding.  

## 2018-07-17 ENCOUNTER — Ambulatory Visit: Payer: BLUE CROSS/BLUE SHIELD | Admitting: Adult Health

## 2018-07-17 ENCOUNTER — Telehealth: Payer: Self-pay | Admitting: *Deleted

## 2018-07-17 NOTE — Telephone Encounter (Signed)
Patient was no show for follow up with NP today.  

## 2018-07-18 ENCOUNTER — Encounter: Payer: Self-pay | Admitting: Adult Health

## 2018-07-19 ENCOUNTER — Encounter: Payer: Self-pay | Admitting: General Surgery

## 2018-07-19 ENCOUNTER — Ambulatory Visit (INDEPENDENT_AMBULATORY_CARE_PROVIDER_SITE_OTHER): Payer: BLUE CROSS/BLUE SHIELD | Admitting: General Surgery

## 2018-07-19 VITALS — BP 116/84 | HR 94 | Resp 18 | Ht 70.0 in | Wt 267.0 lb

## 2018-07-19 DIAGNOSIS — K432 Incisional hernia without obstruction or gangrene: Secondary | ICD-10-CM

## 2018-07-19 NOTE — Patient Instructions (Addendum)
Patient to return in two weeks.  The patient is aware to call back for any questions or concerns. Use blinder all day may remove it at night.

## 2018-07-19 NOTE — Progress Notes (Signed)
byPatient ID: Johnny Rios, male   DOB: Jul 23, 1966, 52 y.o.   MRN: [id number]  Chief Complaint  Patient presents with  . Routine Post Op    HPI Johnny Rios is a 52 y.o. male.  Here today for postoperative visit, ventral hernia repair on 07-11-18, he states he is doing well, still sore and "burning". Denies any gastrointestinal issues, bowels are constipated he has taken ex lax once but plans on taking more. He went to the ED on 07-16-18 for elevated blood pressure. He is here with his friend, Johnny Rios.  HPI  Past Medical History:  Diagnosis Date  . Anxiety   . Arthritis   . Cancer [facility])    SKIN CANCER  . GERD (gastroesophageal reflux disease)   . Sleep apnea    Uses CPAP  . Tachycardia     Past Surgical History:  Procedure Laterality Date  . APPENDECTOMY    . CHOLECYSTECTOMY N/A 01/19/2017   Procedure: LAPAROSCOPIC CHOLECYSTECTOMY WITH INTRAOPERATIVE CHOLANGIOGRAM;  Surgeon: Robert Bellow, MD;  Location: [facility] ORS;  Service: General;  Laterality: N/A;  . TONSILLECTOMY    . UMBILICAL HERNIA REPAIR N/A 06/12/2017   Ventral hernia with 6 cm Ventralex ST mesh.  . VENTRAL HERNIA REPAIR N/A 07/11/2018   Procedure: LAPAROSCOPIC VENTRAL HERNIA;  Surgeon: Robert Bellow, MD;  Location: [facility] ORS;  Service: General;  Laterality: N/A;    Family History  Problem Relation Age of Onset  . Cancer Father        lung  . Alzheimer's disease Mother   . Heart attack Brother   . Heart attack Brother     Social History Social History   Tobacco Use  . Smoking status: Never Smoker  . Smokeless tobacco: Former Systems developer    Types: Chew  Substance Use Topics  . Alcohol use: Yes    Comment: occasional  . Drug use: No    No Known Allergies  Current Outpatient Medications  Medication Sig Dispense Refill  . clonazePAM (KLONOPIN) 1 MG tablet Take 1 tablet (1 mg total) by mouth daily as needed for anxiety. (Patient taking differently: Take 1 mg by mouth every morning. ) 45 tablet 5  .  esomeprazole (NEXIUM) 20 MG capsule Take 1 capsule  by mouth every morning. 30 capsule 3  . meloxicam (MOBIC) 15 MG tablet Take 1 tablet (15 mg total) by mouth daily. 30 tablet 3  . Multiple Vitamins-Minerals (MULTIVITAMIN PO) Take 1 tablet by mouth daily.     No current facility-administered medications for this visit.     Review of Systems Review of Systems  Constitutional: Negative.   Respiratory: Negative.   Cardiovascular: Negative.   Gastrointestinal: Positive for constipation.    Blood pressure 116/84, pulse 94, resp. rate 18, height 5\' 10"  (1.778 m), weight 267 lb (121.1 kg).  Physical Exam Physical Exam  Constitutional: He is oriented to person, place, and time. He appears well-developed and well-nourished.  Abdominal:    Neurological: He is alert and oriented to person, place, and time.  Skin: Skin is warm and dry.     Assessment    Doing well post laparoscopic repair of a recurrent ventral hernia.  No evidence of hypertension on today's exam.    Plan   Patient to return in two weeks.  The patient is aware to call back for any questions or concerns. Use blinder all day may remove it at night.    HPI, Physical Exam, Assessment and Plan have been scribed under  the direction and in the presence of Hervey Ard, MD.  [name], CMA  I have completed the exam and reviewed the above documentation for accuracy and completeness.  I agree with the above.  Haematologist has been used and any errors in dictation or transcription are unintentional.  Hervey Ard, M.D., F.A.C.S.  Johnny Rios 07/20/2018, 6:15 PM

## 2018-07-20 ENCOUNTER — Encounter: Payer: Self-pay | Admitting: General Surgery

## 2018-08-02 ENCOUNTER — Ambulatory Visit: Payer: BLUE CROSS/BLUE SHIELD | Admitting: General Surgery

## 2018-08-07 ENCOUNTER — Ambulatory Visit (INDEPENDENT_AMBULATORY_CARE_PROVIDER_SITE_OTHER): Payer: BLUE CROSS/BLUE SHIELD | Admitting: General Surgery

## 2018-08-07 ENCOUNTER — Encounter: Payer: Self-pay | Admitting: General Surgery

## 2018-08-07 VITALS — BP 116/82 | HR 78 | Resp 14 | Ht 70.0 in | Wt 272.0 lb

## 2018-08-07 DIAGNOSIS — K432 Incisional hernia without obstruction or gangrene: Secondary | ICD-10-CM

## 2018-08-07 NOTE — Patient Instructions (Addendum)
The patient is aware to call back for any questions or concerns. May stop wearing abdominal binder.

## 2018-08-07 NOTE — Progress Notes (Signed)
[name], male   DOB: [birth year], 52 y.o.   MRN: [id number]  Chief Complaint  Patient presents with  . Routine Post Op    HPI Johnny Rios is a 52 y.o. male here for post op follow up from ventral hernia repair done on 07/11/18. He reports some burning in the surgical area with bending over. This comes and goes. Otherwise he is doing well with no problems with eating or drinking. He is here today with his friend, [name].   HPI  Past Medical History:  Diagnosis Date  . Anxiety   . Arthritis   . Cancer [facility])    SKIN CANCER  . GERD (gastroesophageal reflux disease)   . Sleep apnea    Uses CPAP  . Tachycardia     Past Surgical History:  Procedure Laterality Date  . APPENDECTOMY    . CHOLECYSTECTOMY N/A 01/19/2017   Procedure: LAPAROSCOPIC CHOLECYSTECTOMY WITH INTRAOPERATIVE CHOLANGIOGRAM;  Surgeon: Robert Bellow, MD;  Location: [facility] ORS;  Service: General;  Laterality: N/A;  . TONSILLECTOMY    . UMBILICAL HERNIA REPAIR N/A 06/12/2017   Ventral hernia with 6 cm Ventralex ST mesh.  . VENTRAL HERNIA REPAIR N/A 07/11/2018   15 x 20 cm intraperitoneal Ventralex mesh  ;  Surgeon: Robert Bellow, MD;  Location: [facility] ORS;  Service: General;  Laterality: N/A;    Family History  Problem Relation Age of Onset  . Cancer Father        lung  . Alzheimer's disease Mother   . Heart attack Brother   . Heart attack Brother     Social History Social History   Tobacco Use  . Smoking status: Never Smoker  . Smokeless tobacco: Former Systems developer    Types: Chew  Substance Use Topics  . Alcohol use: Yes    Comment: occasional  . Drug use: No    No Known Allergies  Current Outpatient Medications  Medication Sig Dispense Refill  . clonazePAM (KLONOPIN) 1 MG tablet Take 1 tablet (1 mg total) by mouth daily as needed for anxiety. (Patient taking differently: Take 1 mg by mouth every morning. ) 45 tablet 5  . esomeprazole (NEXIUM) 20 MG capsule Take 1 capsule  by mouth  every morning. 30 capsule 3  . meloxicam (MOBIC) 15 MG tablet Take 1 tablet (15 mg total) by mouth daily. 30 tablet 3  . Multiple Vitamins-Minerals (MULTIVITAMIN PO) Take 1 tablet by mouth daily.     No current facility-administered medications for this visit.     Review of Systems Review of Systems  Constitutional: Negative.   Respiratory: Negative.   Cardiovascular: Negative.     Blood pressure 116/82, pulse 78, resp. rate 14, height 5\' 10"  (1.778 m), weight 272 lb (123.4 kg).  Physical Exam Physical Exam  Constitutional: He is oriented to person, place, and time. He appears well-developed and well-nourished.  HENT:  Mouth/Throat: No oropharyngeal exudate.  Eyes: Conjunctivae are normal. No scleral icterus.  Neck: Neck supple.  Abdominal: Soft. Bowel sounds are normal.    repair intact  Neurological: He is alert and oriented to person, place, and time.  Skin: Skin is warm and dry.  Psychiatric: His behavior is normal.       Assessment    Doing well post intraperitoneal mesh repair, partial removal of previous mesh.    Plan      Proper lifting techniques reviewed. Resume activities as tolerated. May stop wearing abdominal binder. Follow up in 2 months  HPI, Physical Exam, Assessment and Plan have been scribed under the direction and in the presence of Robert Bellow, MD. Karie Fetch, RN  I have completed the exam and reviewed the above documentation for accuracy and completeness.  I agree with the above.  Haematologist has been used and any errors in dictation or transcription are unintentional.  Hervey Ard, M.D., F.A.C.S.  [name] Sylena Lotter 08/07/2018, 9:21 AM

## 2018-08-08 ENCOUNTER — Telehealth: Payer: Self-pay

## 2018-08-08 NOTE — Telephone Encounter (Signed)
Fax from pharmacy. States patient needs rx for clonazepam sig: Take 1-2 tablets by mouth as needed for anxiety. Not 1 a day.

## 2018-08-08 NOTE — Telephone Encounter (Signed)
Please see telephone note from 07/02/18, "Phone call Discussed with patient will not increase lorazepam continue current dosages."

## 2018-08-08 NOTE — Telephone Encounter (Signed)
I do not write this, OK to give verbal to change sig but will leave for MACs review. Not sure why this is just now an issue - it was written back in April

## 2018-08-09 ENCOUNTER — Telehealth: Payer: Self-pay | Admitting: Family Medicine

## 2018-08-09 NOTE — Telephone Encounter (Signed)
Glass blower/designer calling to speak with provider about refill

## 2018-08-09 NOTE — Telephone Encounter (Signed)
Johnny Rios spoke with patients wife.

## 2018-08-09 NOTE — Telephone Encounter (Signed)
Pt calling to check status of RX change. Please notify him once approved and called into the pharmacy. Ph # [phone]

## 2018-08-09 NOTE — Telephone Encounter (Signed)
Copied from [place] [id number]. Topic: Quick Communication - Rx Refill/Question >> Aug 09, 2018  9:23 AM Neva Seat wrote: Pharmacy will not fill pt's clonazePAM (KLONOPIN) 1 MG tablet.  The wording is 1 a day.  Pt is needing the Rx written as 1 to 2 daily as needed so he can get the Rx filled sooner. Please have this changed for pt to get his Rx filled.  Pt is out!

## 2018-08-09 NOTE — Telephone Encounter (Signed)
Called and spoke with Dr. Golden Pop. Glass blower/designer, Barth Kirks, witnessed. Provider stated he would okay sig change of 1-2 tablets daily for one month. He stated he would sort it out when he returns.

## 2018-08-12 ENCOUNTER — Encounter: Payer: Self-pay | Admitting: Family Medicine

## 2018-08-12 NOTE — Telephone Encounter (Signed)
This changes okay.  I did write for 45 pills in a month to take 1 a day and some days take an extra not to exceed 45 pills in 1 month.  I can take care of all this when I get back.

## 2018-09-10 ENCOUNTER — Other Ambulatory Visit: Payer: Self-pay | Admitting: Family Medicine

## 2018-10-09 ENCOUNTER — Ambulatory Visit (INDEPENDENT_AMBULATORY_CARE_PROVIDER_SITE_OTHER): Payer: BLUE CROSS/BLUE SHIELD | Admitting: General Surgery

## 2018-10-09 ENCOUNTER — Other Ambulatory Visit: Payer: Self-pay | Admitting: Family Medicine

## 2018-10-09 ENCOUNTER — Encounter: Payer: Self-pay | Admitting: General Surgery

## 2018-10-09 VITALS — BP 144/94 | HR 98 | Temp 98.8°F | Resp 18 | Ht 70.0 in | Wt 272.0 lb

## 2018-10-09 DIAGNOSIS — K432 Incisional hernia without obstruction or gangrene: Secondary | ICD-10-CM

## 2018-10-09 NOTE — Patient Instructions (Addendum)
Please call our office if you have any questions or concerns.   Please try to exercise daily and maintain a healthy weight.

## 2018-10-09 NOTE — Progress Notes (Signed)
[name], male   DOB: Oct 20, 1966, 52 y.o.   MRN: [id number]  Chief Complaint  Patient presents with  . Follow-up    Ventral Hernia repair    HPI Johnny Rios is a 52 y.o. male. Here today for post-operative care from Ventral hernia repair in July 11, 2016. Patient states he is experiencing right lower quadrant burning and stinging pain and at times is very sharp. Denies nausea or vomiting. Denies fever or chills. Regular bowel movements daily.   Past Medical History:  Diagnosis Date  . Anxiety   . Arthritis   . Cancer [facility])    SKIN CANCER  . GERD (gastroesophageal reflux disease)   . Sleep apnea    Uses CPAP  . Tachycardia     Past Surgical History:  Procedure Laterality Date  . APPENDECTOMY    . CHOLECYSTECTOMY N/A 01/19/2017   Procedure: LAPAROSCOPIC CHOLECYSTECTOMY WITH INTRAOPERATIVE CHOLANGIOGRAM;  Surgeon: Robert Bellow, MD;  Location: [facility] ORS;  Service: General;  Laterality: N/A;  . TONSILLECTOMY    . UMBILICAL HERNIA REPAIR N/A 06/12/2017   Ventral hernia with 6 cm Ventralex ST mesh.  . VENTRAL HERNIA REPAIR N/A 07/11/2018   15 x 20 cm intraperitoneal Ventralex mesh  ;  Surgeon: Robert Bellow, MD;  Location: [facility] ORS;  Service: General;  Laterality: N/A;    Family History  Problem Relation Age of Onset  . Cancer Father        lung  . Alzheimer's disease Mother   . Heart attack Brother   . Heart attack Brother     Social History Social History   Tobacco Use  . Smoking status: Never Smoker  . Smokeless tobacco: Former Systems developer    Types: Chew  Substance Use Topics  . Alcohol use: Yes    Comment: occasional  . Drug use: No    No Known Allergies  Current Outpatient Medications  Medication Sig Dispense Refill  . clonazePAM (KLONOPIN) 1 MG tablet Take 1 tablet (1 mg total) by mouth daily as needed for anxiety. (Patient taking differently: Take 1 mg by mouth every morning. ) 45 tablet 5  . cyclobenzaprine (FLEXERIL) 10 MG tablet    2  . esomeprazole (NEXIUM) 20 MG capsule Take 1 capsule  by mouth every morning. 30 capsule 0  . meloxicam (MOBIC) 15 MG tablet Take 1 tablet (15 mg total) by mouth daily. 30 tablet 3  . Multiple Vitamins-Minerals (MULTIVITAMIN PO) Take 1 tablet by mouth daily.    Marland Kitchen esomeprazole (NEXIUM) 20 MG capsule Take 1 capsule  by mouth every morning. 90 capsule 1   No current facility-administered medications for this visit.     Review of Systems Review of Systems  Constitutional: Negative.   Respiratory: Negative.   Gastrointestinal: Positive for abdominal pain.  Neurological: Negative.     Blood pressure (!) 144/94, pulse 98, temperature 98.8 F (37.1 C), temperature source Temporal, resp. rate 18, height 5\' 10"  (1.778 m), weight 272 lb (123.4 kg), SpO2 97 %.  Physical Exam Physical Exam  Constitutional: He is oriented to person, place, and time. He appears well-developed and well-nourished.  Cardiovascular: Normal rate, regular rhythm and normal heart sounds.  Pulmonary/Chest: Effort normal and breath sounds normal.  Neurological: He is alert and oriented to person, place, and time.  Skin: Skin is warm and dry.  Abdomen: Diastases superiorly.  No evidence of recurrent herniation or seroma formation around the umbilicus.  No focal tenderness.  Data Reviewed Operative  report  Assessment    Stable abdominal exam.  No evidence of recurrent ventral hernia.    Plan    Please call our office if you have any questions or concerns.     Please try to exercise daily and maintain a healthy weight.   HPI, Physical Exam, Assessment and Plan have been scribed under the direction and in the presence of Robert Bellow, MD. Jonnie Finner, CMA  I have completed the exam and reviewed the above documentation for accuracy and completeness.  I agree with the above.  Haematologist has been used and any errors in dictation or transcription are unintentional.  Hervey Ard, M.D.,  F.A.C.S.  [name] Nohemy Koop 10/09/2018, 8:24 PM

## 2018-10-11 ENCOUNTER — Encounter: Payer: BLUE CROSS/BLUE SHIELD | Admitting: Family Medicine

## 2018-10-12 ENCOUNTER — Other Ambulatory Visit: Payer: Self-pay | Admitting: Family Medicine

## 2018-10-16 ENCOUNTER — Other Ambulatory Visit: Payer: Self-pay | Admitting: Family Medicine

## 2018-10-16 ENCOUNTER — Ambulatory Visit: Payer: BLUE CROSS/BLUE SHIELD | Admitting: Family Medicine

## 2018-10-16 ENCOUNTER — Other Ambulatory Visit: Payer: Self-pay

## 2018-10-16 ENCOUNTER — Emergency Department: Payer: BLUE CROSS/BLUE SHIELD

## 2018-10-16 DIAGNOSIS — R42 Dizziness and giddiness: Secondary | ICD-10-CM | POA: Insufficient documentation

## 2018-10-16 DIAGNOSIS — Z5321 Procedure and treatment not carried out due to patient leaving prior to being seen by health care provider: Secondary | ICD-10-CM | POA: Insufficient documentation

## 2018-10-16 DIAGNOSIS — F419 Anxiety disorder, unspecified: Secondary | ICD-10-CM

## 2018-10-16 LAB — DIFFERENTIAL
Abs Immature Granulocytes: 0.04 10*3/uL (ref 0.00–0.07)
Basophils Absolute: 0.1 10*3/uL (ref 0.0–0.1)
Basophils Relative: 1 %
EOS ABS: 0.2 10*3/uL (ref 0.0–0.5)
EOS PCT: 2 %
Immature Granulocytes: 1 %
LYMPHS ABS: 3.3 10*3/uL (ref 0.7–4.0)
Lymphocytes Relative: 42 %
MONO ABS: 0.6 10*3/uL (ref 0.1–1.0)
MONOS PCT: 8 %
Neutro Abs: 3.5 10*3/uL (ref 1.7–7.7)
Neutrophils Relative %: 46 %

## 2018-10-16 LAB — COMPREHENSIVE METABOLIC PANEL
ALT: 36 U/L (ref 0–44)
ANION GAP: 8 (ref 5–15)
AST: 25 U/L (ref 15–41)
Albumin: 3.9 g/dL (ref 3.5–5.0)
Alkaline Phosphatase: 91 U/L (ref 38–126)
BILIRUBIN TOTAL: 0.6 mg/dL (ref 0.3–1.2)
BUN: 13 mg/dL (ref 6–20)
CO2: 25 mmol/L (ref 22–32)
Calcium: 9.2 mg/dL (ref 8.9–10.3)
Chloride: 106 mmol/L (ref 98–111)
Creatinine, Ser: 0.95 mg/dL (ref 0.61–1.24)
GFR calc Af Amer: >60 mL/min (ref >60)
Glucose, Bld: 124 mg/dL — ABNORMAL HIGH (ref 70–99)
Potassium: 4.1 mmol/L (ref 3.5–5.1)
Sodium: 139 mmol/L (ref 135–145)
TOTAL PROTEIN: 7 g/dL (ref 6.5–8.1)

## 2018-10-16 LAB — APTT: aPTT: 33 seconds (ref 24–36)

## 2018-10-16 LAB — PROTIME-INR
INR: 0.94
PROTHROMBIN TIME: 12.5 s (ref 11.4–15.2)

## 2018-10-16 LAB — CBC
HCT: 39.7 % (ref 39.0–52.0)
Hemoglobin: 12.5 g/dL — ABNORMAL LOW (ref 13.0–17.0)
MCH: 25.2 pg — AB (ref 26.0–34.0)
MCHC: 31.5 g/dL (ref 30.0–36.0)
MCV: 79.9 fL — ABNORMAL LOW (ref 80.0–100.0)
PLATELETS: 298 10*3/uL (ref 150–400)
RBC: 4.97 MIL/uL (ref 4.22–5.81)
RDW: 13.4 % (ref 11.5–15.5)
WBC: 7.7 10*3/uL (ref 4.0–10.5)
nRBC: 0 % (ref 0.0–0.2)

## 2018-10-16 LAB — TROPONIN I

## 2018-10-16 NOTE — Telephone Encounter (Signed)
Requested medication (s) are due for refill today:   Yes  Requested medication (s) are on the active medication list:   Yes  Future visit scheduled:   Yes 01/07/19 with [facility]   Last ordered: 04/16/18  #45 with 5 refills   Requested Prescriptions  Pending Prescriptions Disp Refills   clonazePAM (KLONOPIN) 1 MG tablet [Pharmacy Med Name: CLONAZEPAM 1 MG TABLET] 45 tablet 4    Sig: TAKE 1 TABLET BY MOUTH ONCE A DAY AS NEEDED FOR ANXIETY     Not Delegated - Psychiatry:  Anxiolytics/Hypnotics Failed - 10/16/2018  9:37 AM      Failed - This refill cannot be delegated      Failed - Urine Drug Screen completed in last 360 days.      Failed - Valid encounter within last 6 months    Recent Outpatient Visits          6 months ago Mixed hyperlipidemia   [facility], Jeannette How, MD   1 year ago Anxiety   [facility] Crissman, Jeannette How, MD   1 year ago Annual physical exam   [facility] Guadalupe Maple, MD   1 year ago [place] [facility], Jeannette How, MD   1 year ago [place], [place], [place]      Future Appointments            In 2 months Cr[facility], Jeannette How, MD [facility] [place]

## 2018-10-16 NOTE — Telephone Encounter (Signed)
Copied from [place] [id number]. Topic: General - Other >> Oct 16, 2018  2:29 PM Janace Aris A wrote: Medication: clonazePAM (KLONOPIN) 1 MG tablet   Has the patient contacted their pharmacy? Yes   Preferred Pharmacy (with phone number or street name):    [place], [place] - [address]  [phone] (Phone) [phone] (Fax)    Agent: Please be advised that RX refills may take up to 3 business days. We ask that you follow-up with your pharmacy.

## 2018-10-16 NOTE — ED Triage Notes (Addendum)
A&O, ambulatory. States today got lightheaded and thought he was going to pass out, states dizziness and facial numbness on L side. Took clonodine and symptoms resolved. States BP has been high (164/109 and 143/109) at home. No weakness noted. Wife states slurred speech. No slurred speech noted at this time. Denies vision changes. LKW "around 2 or something."

## 2018-10-16 NOTE — Telephone Encounter (Signed)
Duplicate request. See 2nd telephone encounter for 10/16/18

## 2018-10-17 ENCOUNTER — Emergency Department
Admission: EM | Admit: 2018-10-17 | Discharge: 2018-10-17 | Disposition: A | Discharge status: Home / Self Care | Payer: BLUE CROSS/BLUE SHIELD | Attending: Emergency Medicine | Admitting: Emergency Medicine

## 2018-10-17 ENCOUNTER — Telehealth: Payer: Self-pay | Admitting: Family Medicine

## 2018-10-17 NOTE — Telephone Encounter (Signed)
Copied from [place] [id number]. Topic: Quick Communication - Rx Refill/Question >> Oct 17, 2018  1:20 PM Burchel, Abbi R wrote: Medication: clonazePAM (KLONOPIN) 1 MG tablet  Pt needs rx re written to state: take 1-2 tablets daily as needed for anxiety. Pharmacy will not refill until rx is clarified.    Preferred Pharmacy: [place], [place] [address] Phone: [phone] Fax: [phone]   Pt was advised that RX refills may take up to 3 business days. We ask that you follow-up with your pharmacy.

## 2018-10-22 ENCOUNTER — Ambulatory Visit: Payer: BLUE CROSS/BLUE SHIELD | Admitting: Family Medicine

## 2018-10-22 ENCOUNTER — Encounter: Payer: Self-pay | Admitting: Family Medicine

## 2018-10-22 DIAGNOSIS — F419 Anxiety disorder, unspecified: Secondary | ICD-10-CM | POA: Diagnosis not present

## 2018-10-22 MED ORDER — CLONAZEPAM 1 MG PO TABS: 1.0000 mg | ORAL_TABLET | Freq: Two times a day (BID) | ORAL | 4 refills | Status: DC | PRN

## 2018-10-22 MED ORDER — FLUOXETINE HCL 20 MG PO TABS: 20.0000 mg | ORAL_TABLET | Freq: Every day | ORAL | 3 refills | Status: DC

## 2018-10-22 NOTE — Progress Notes (Signed)
BP (!) 134/92   Pulse 90   Ht 5' 9.69" (1.77 m)   Wt 275 lb (124.7 kg)   SpO2 98%   BMI 39.82 kg/m    Subjective:    [name], male    DOB: Jul 24, 1966, 52 y.o.   MRN: [id number]  HPI: Johnny Rios is a 52 y.o. male  Anxiety recheck Patient accompanied by his wife who assists with history. Patient with some confusion on clonazepam has taken to 45 a month faithfully for years with no increase usage no decrease usage.  Patient is having anxiety spells and actually ended up in the emergency room with CT of his head earlier this month.  His blood pressures been elevated during anxiety spells.  On review with patient this was related more to anxiety than anything else. I wrote his last prescription wrong saying 1 a day instead of 1 or 2 a day this is been redone and sent in. Reviewed with patient his anxiety seems to be getting worse may need other medications patient willing to try. Discussed hypertension blood pressure elevated with anxiety spells  Relevant past medical, surgical, family and social history reviewed and updated as indicated. Interim medical history since our last visit reviewed. Allergies and medications reviewed and updated.  Review of Systems  Constitutional: Negative.   Respiratory: Negative.   Cardiovascular: Negative.     Per HPI unless specifically indicated above     Objective:    BP (!) 134/92   Pulse 90   Ht 5' 9.69" (1.77 m)   Wt 275 lb (124.7 kg)   SpO2 98%   BMI 39.82 kg/m   Wt Readings from Last 3 Encounters:  10/22/18 275 lb (124.7 kg)  10/16/18 270 lb (122.5 kg)  10/09/18 272 lb (123.4 kg)    Physical Exam  Constitutional: He is oriented to person, place, and time. He appears well-developed and well-nourished.  HENT:  Head: Normocephalic and atraumatic.  Eyes: Conjunctivae and EOM are normal.  Neck: Normal range of motion.  Cardiovascular: Normal rate, regular rhythm and normal heart sounds.  Pulmonary/Chest:  Effort normal and breath sounds normal.  Musculoskeletal: Normal range of motion.  Neurological: He is alert and oriented to person, place, and time.  Skin: No erythema.  Psychiatric: He has a normal mood and affect. His behavior is normal. Judgment and thought content normal.    Results for orders placed or performed during the hospital encounter of 10/17/18  Protime-INR  Result Value Ref Range   Prothrombin Time 12.5 11.4 - 15.2 seconds   INR 0.94   APTT  Result Value Ref Range   aPTT 33 24 - 36 seconds  CBC  Result Value Ref Range   WBC 7.7 4.0 - 10.5 K/uL   RBC 4.97 4.22 - 5.81 MIL/uL   Hemoglobin 12.5 (L) 13.0 - 17.0 g/dL   HCT 39.7 39.0 - 52.0 %   MCV 79.9 (L) 80.0 - 100.0 fL   MCH 25.2 (L) 26.0 - 34.0 pg   MCHC 31.5 30.0 - 36.0 g/dL   RDW 13.4 11.5 - 15.5 %   Platelets 298 150 - 400 K/uL   nRBC 0.0 0.0 - 0.2 %  Differential  Result Value Ref Range   Neutrophils Relative % 46 %   Neutro Abs 3.5 1.7 - 7.7 K/uL   Lymphocytes Relative 42 %   Lymphs Abs 3.3 0.7 - 4.0 K/uL   Monocytes Relative 8 %   Monocytes Absolute 0.6  0.1 - 1.0 K/uL   Eosinophils Relative 2 %   Eosinophils Absolute 0.2 0.0 - 0.5 K/uL   Basophils Relative 1 %   Basophils Absolute 0.1 0.0 - 0.1 K/uL   Immature Granulocytes 1 %   Abs Immature Granulocytes 0.04 0.00 - 0.07 K/uL  Comprehensive metabolic panel  Result Value Ref Range   Sodium 139 135 - 145 mmol/L   Potassium 4.1 3.5 - 5.1 mmol/L   Chloride 106 98 - 111 mmol/L   CO2 25 22 - 32 mmol/L   Glucose, Bld 124 (H) 70 - 99 mg/dL   BUN 13 6 - 20 mg/dL   Creatinine, Ser 0.95 0.61 - 1.24 mg/dL   Calcium 9.2 8.9 - 10.3 mg/dL   Total Protein 7.0 6.5 - 8.1 g/dL   Albumin 3.9 3.5 - 5.0 g/dL   AST 25 15 - 41 U/L   ALT 36 0 - 44 U/L   Alkaline Phosphatase 91 38 - 126 U/L   Total Bilirubin 0.6 0.3 - 1.2 mg/dL   GFR calc non Af Amer >60 >60 mL/min   GFR calc Af Amer >60 >60 mL/min   Anion gap 8 5 - 15  Troponin I  Result Value Ref Range    Troponin I <0.03 <0.03 ng/mL      Assessment & Plan:   Problem List Items Addressed This Visit      Other   Anxiety    Discussed anxiety we will continue clonazepam 45 a month gave refill for 4 refills and rewrote prescription to say 1 or 2 a day. Also will start fluoxetine 20 mg 1 a day to see if that helps with some of his anxiety.  Also gave warning about too soon increased irritability poor sleep are other type bipolar type symptoms.  If so will need to stop fluoxetine and start other medications.      Relevant Medications   clonazePAM (KLONOPIN) 1 MG tablet   FLUoxetine (PROZAC) 20 MG tablet       Follow up plan: Return in about 4 weeks (around 11/19/2018) for med check.

## 2018-10-22 NOTE — Assessment & Plan Note (Signed)
Discussed anxiety we will continue clonazepam 45 a month gave refill for 4 refills and rewrote prescription to say 1 or 2 a day. Also will start fluoxetine 20 mg 1 a day to see if that helps with some of his anxiety.  Also gave warning about too soon increased irritability poor sleep are other type bipolar type symptoms.  If so will need to stop fluoxetine and start other medications.

## 2018-11-26 ENCOUNTER — Ambulatory Visit: Payer: BLUE CROSS/BLUE SHIELD | Admitting: Family Medicine

## 2018-12-31 ENCOUNTER — Encounter: Payer: Self-pay | Admitting: Family Medicine

## 2018-12-31 ENCOUNTER — Ambulatory Visit: Payer: BLUE CROSS/BLUE SHIELD | Admitting: Family Medicine

## 2018-12-31 ENCOUNTER — Other Ambulatory Visit: Payer: Self-pay

## 2018-12-31 VITALS — BP 134/94 | HR 114 | Temp 98.4°F | Ht 69.6 in | Wt 271.3 lb

## 2018-12-31 DIAGNOSIS — K130 Diseases of lips: Secondary | ICD-10-CM

## 2018-12-31 MED ORDER — ACYCLOVIR 5 % EX OINT: 1.0000 "application " | TOPICAL_OINTMENT | CUTANEOUS | 0 refills | Status: DC

## 2018-12-31 MED ORDER — LIDOCAINE VISCOUS HCL 2 % MT SOLN: 5.0000 mL | OROMUCOSAL | 0 refills | Status: DC | PRN

## 2018-12-31 MED ORDER — AMOXICILLIN 875 MG PO TABS: 875.0000 mg | ORAL_TABLET | Freq: Two times a day (BID) | ORAL | 0 refills | Status: DC

## 2018-12-31 NOTE — Progress Notes (Signed)
BP (!) 134/94 (BP Location: Left Arm, Patient Position: Sitting, Cuff Size: Large)   Pulse (!) 114   Temp 98.4 F (36.9 C)   Ht 5' 9.6" (1.768 m)   Wt 271 lb 5 oz (123.1 kg)   SpO2 95%   BMI 39.38 kg/m    Subjective:    [name], male    DOB: 1966-06-18, 53 y.o.   MRN: [id number]  HPI: Johnny Rios is a 53 y.o. male  Chief Complaint  Patient presents with  . Oral Swelling    patient states that his lip started swelling on Saturday and now he is having pain in top and bottom teeth left side   Sore popped up on bottom left lip 3 days ago and now having significant pain in surrounding teeth as well. No fevers, injury, new foods or medications, jaw swelling. Taking aleve without much relief. No known hx of HSV.   Relevant past medical, surgical, family and social history reviewed and updated as indicated. Interim medical history since our last visit reviewed. Allergies and medications reviewed and updated.  Review of Systems  Per HPI unless specifically indicated above     Objective:    BP (!) 134/94 (BP Location: Left Arm, Patient Position: Sitting, Cuff Size: Large)   Pulse (!) 114   Temp 98.4 F (36.9 C)   Ht 5' 9.6" (1.768 m)   Wt 271 lb 5 oz (123.1 kg)   SpO2 95%   BMI 39.38 kg/m   Wt Readings from Last 3 Encounters:  12/31/18 271 lb 5 oz (123.1 kg)  10/22/18 275 lb (124.7 kg)  10/16/18 270 lb (122.5 kg)    Physical Exam Vitals signs and nursing note reviewed.  Constitutional:      Appearance: Normal appearance.  HENT:     Head: Atraumatic.  Eyes:     Extraocular Movements: Extraocular movements intact.     Conjunctiva/sclera: Conjunctivae normal.  Neck:     Musculoskeletal: Normal range of motion and neck supple.  Cardiovascular:     Rate and Rhythm: Normal rate and regular rhythm.  Pulmonary:     Effort: Pulmonary effort is normal.     Breath sounds: Normal breath sounds.  Musculoskeletal: Normal range of motion.  Skin:  General: Skin is warm and dry.     Findings: Lesion (0.5 cm erythematous ulcer left lower lip) present.     Comments: Area of tenderness and fluctuance left lower gingiva   Neurological:     General: No focal deficit present.     Mental Status: He is oriented to person, place, and time.  Psychiatric:        Mood and Affect: Mood normal.        Thought Content: Thought content normal.        Judgment: Judgment normal.     Results for orders placed or performed in visit on 12/31/18  HSV(herpes simplex vrs) 1+2 ab-IgG  Result Value Ref Range   HSV 1 Glycoprotein G Ab, IgG 25.30 (H) 0.00 - 0.90 index   HSV 2 IgG, Type Spec <0.91 0.00 - 0.90 index      Assessment & Plan:   Problem List Items Addressed This Visit    None    Visit Diagnoses    Lip lesion    -  Primary   Will test for HSV and tx with abx in case dental infection. Topical zovirax given for lip ulcer in case herpetic. Viscous lidocaine for  pain   Relevant Orders   HSV(herpes simplex vrs) 1+2 ab-IgG (Completed)       Follow up plan: Return if symptoms worsen or fail to improve.

## 2019-01-01 LAB — HSV(HERPES SIMPLEX VRS) I + II AB-IGG
HSV 1 Glycoprotein G Ab, IgG: 25.3 {index} — ABNORMAL HIGH (ref 0.00–0.90)
HSV 2 IgG, Type Spec: <0.91 {index} (ref 0.00–0.90)

## 2019-01-07 ENCOUNTER — Encounter: Payer: BLUE CROSS/BLUE SHIELD | Admitting: Family Medicine

## 2019-01-07 ENCOUNTER — Encounter

## 2019-02-05 ENCOUNTER — Encounter: Payer: Self-pay | Admitting: General Surgery

## 2019-02-05 ENCOUNTER — Other Ambulatory Visit: Payer: Self-pay

## 2019-02-05 ENCOUNTER — Ambulatory Visit (INDEPENDENT_AMBULATORY_CARE_PROVIDER_SITE_OTHER): Payer: BLUE CROSS/BLUE SHIELD | Admitting: General Surgery

## 2019-02-05 ENCOUNTER — Ambulatory Visit (INDEPENDENT_AMBULATORY_CARE_PROVIDER_SITE_OTHER): Payer: BLUE CROSS/BLUE SHIELD

## 2019-02-05 VITALS — BP 151/97 | HR 103 | Temp 98.1°F | Resp 18 | Ht 69.0 in | Wt 274.8 lb

## 2019-02-05 DIAGNOSIS — K432 Incisional hernia without obstruction or gangrene: Secondary | ICD-10-CM

## 2019-02-05 NOTE — Patient Instructions (Signed)
The patient is aware to call back for any questions or new concerns.  

## 2019-02-05 NOTE — Progress Notes (Signed)
[name], male   DOB: July 12, 1966, 53 y.o.   MRN: [id number]  Chief Complaint  Patient presents with  . Follow-up    hernia     HPI Johnny Rios is a 53 y.o. male here today for his follow up ventral hernia. He states the area is "sticking out". Occasional discomfort. He is here with his friend, [name].  HPI  Past Medical History:  Diagnosis Date  . Anxiety   . Arthritis   . Cancer [facility])    SKIN CANCER  . GERD (gastroesophageal reflux disease)   . Sleep apnea    Uses CPAP  . Tachycardia     Past Surgical History:  Procedure Laterality Date  . APPENDECTOMY    . CHOLECYSTECTOMY N/A 01/19/2017   Procedure: LAPAROSCOPIC CHOLECYSTECTOMY WITH INTRAOPERATIVE CHOLANGIOGRAM;  Surgeon: Robert Bellow, MD;  Location: [facility] ORS;  Service: General;  Laterality: N/A;  . TONSILLECTOMY    . UMBILICAL HERNIA REPAIR N/A 06/12/2017   Ventral hernia with 6 cm Ventralex ST mesh.  . VENTRAL HERNIA REPAIR N/A 07/11/2018   15 x 20 cm intraperitoneal Ventralex mesh  ;  Surgeon: Robert Bellow, MD;  Location: [facility] ORS;  Service: General;  Laterality: N/A;    Family History  Problem Relation Age of Onset  . Cancer Father        lung  . Alzheimer's disease Mother   . Heart attack Brother   . Heart attack Brother     Social History Social History   Tobacco Use  . Smoking status: Never Smoker  . Smokeless tobacco: Former Systems developer    Types: Chew  Substance Use Topics  . Alcohol use: Yes    Comment: occasional  . Drug use: No    No Known Allergies  Current Outpatient Medications  Medication Sig Dispense Refill  . clonazePAM (KLONOPIN) 1 MG tablet Take 1 tablet (1 mg total) by mouth 2 (two) times daily as needed for anxiety. 45 tablet 4  . cyclobenzaprine (FLEXERIL) 10 MG tablet   2  . esomeprazole (NEXIUM) 20 MG capsule Take 1 capsule  by mouth every morning. 90 capsule 0   No current facility-administered medications for this visit.     Review of  Systems Review of Systems  Constitutional: Negative.   Respiratory: Negative.   Cardiovascular: Negative.     Blood pressure (!) 151/97, pulse (!) 103, temperature 98.1 F (36.7 C), temperature source Skin, resp. rate 18, height 5\' 9"  (1.753 m), weight 274 lb 12.8 oz (124.6 kg), SpO2 96 %.  Physical Exam Physical Exam Constitutional:      Appearance: He is well-developed.  HENT:     Mouth/Throat:     Pharynx: No oropharyngeal exudate.  Eyes:     General: No scleral icterus.    Conjunctiva/sclera: Conjunctivae normal.  Neck:     Musculoskeletal: Neck supple.  Cardiovascular:     Rate and Rhythm: Normal rate and regular rhythm.     Heart sounds: Normal heart sounds.  Pulmonary:     Effort: Pulmonary effort is normal.     Breath sounds: Normal breath sounds.  Abdominal:     General: Bowel sounds are normal.     Palpations: Abdomen is soft.    Skin:    General: Skin is warm and dry.  Neurological:     Mental Status: He is alert and oriented to person, place, and time.  Psychiatric:        Behavior: Behavior normal.  Data Reviewed Ultrasound examination was completed in the supine and standing position.  In the supine position with legs elevated off the examining table of the mesh has a soft undulation.  At rest there is a ripple in the mesh above the level of the umbilicus and to the right of the midline but no evidence of loss of adherence to the overlying tissue.  No evidence of contents swinging lateral or superior to the mesh.  In the standing position the mesh appears smooth.  Assessment    No clear evidence of recurrent fascial defect at the periphery of the mesh placement, well away from the defect in the midline.    Plan    The patient will remain as active is comfortable, he will report if there is any significant change for early assessment, otherwise we will plan for a follow-up exam in 3 months.        HPI, assessment, plan and physical exam has  been scribed under the direction and in the presence of Robert Bellow, MD. Karie Fetch, RN   I have completed the exam and reviewed the above documentation for accuracy and completeness.  I agree with the above.  Haematologist has been used and any errors in dictation or transcription are unintentional.  Hervey Ard, M.D., F.A.C.S.   [name] Byrnett 02/05/2019, 9:22 PM

## 2019-03-14 ENCOUNTER — Encounter: Payer: Self-pay | Admitting: General Surgery

## 2019-03-14 ENCOUNTER — Other Ambulatory Visit: Payer: Self-pay

## 2019-03-14 ENCOUNTER — Ambulatory Visit (INDEPENDENT_AMBULATORY_CARE_PROVIDER_SITE_OTHER): Payer: BLUE CROSS/BLUE SHIELD | Admitting: General Surgery

## 2019-03-14 ENCOUNTER — Ambulatory Visit
Admission: RE | Admit: 2019-03-14 | Discharge: 2019-03-14 | Disposition: A | Discharge status: Home / Self Care | Payer: BLUE CROSS/BLUE SHIELD | Source: Ambulatory Visit | Attending: General Surgery | Admitting: General Surgery

## 2019-03-14 VITALS — BP 133/98 | HR 105 | Temp 97.7°F | Resp 16 | Ht 69.0 in | Wt 273.0 lb

## 2019-03-14 DIAGNOSIS — K432 Incisional hernia without obstruction or gangrene: Secondary | ICD-10-CM | POA: Insufficient documentation

## 2019-03-14 MED ORDER — IOHEXOL 300 MG/ML  SOLN
100.0000 mL | Freq: Once | INTRAMUSCULAR | Status: AC | PRN
  Administered 2019-03-14: 100 mL via INTRAVENOUS

## 2019-03-14 NOTE — Patient Instructions (Signed)
The patient is aware to call back for any questions or new concerns.  Schedule CT scan 

## 2019-03-14 NOTE — Progress Notes (Addendum)
[name], male   DOB: [birth year], 53 y.o.   MRN: [id number]  Chief Complaint  Patient presents with  . Follow-up     increased pain with hernia     HPI Johnny Rios is a 53 y.o. male.  Here for evaluation of increased abdominal pain that started this morning. He states the pain lasted about 1.5 hours.  Now resolved.  He feels like the area right of belly button is a "little larger" than before. He states his stools were loose this morning. He states he had fried chicken last night. He is here with his friend, [name].  HPI  Past Medical History:  Diagnosis Date  . Anxiety   . Arthritis   . Cancer [facility])    SKIN CANCER  . GERD (gastroesophageal reflux disease)   . Sleep apnea    Uses CPAP  . Tachycardia     Past Surgical History:  Procedure Laterality Date  . APPENDECTOMY    . CHOLECYSTECTOMY N/A 01/19/2017   Procedure: LAPAROSCOPIC CHOLECYSTECTOMY WITH INTRAOPERATIVE CHOLANGIOGRAM;  Surgeon: Robert Bellow, MD;  Location: [facility] ORS;  Service: General;  Laterality: N/A;  . TONSILLECTOMY    . UMBILICAL HERNIA REPAIR N/A 06/12/2017   Ventral hernia with 6 cm Ventralex ST mesh.  . VENTRAL HERNIA REPAIR N/A 07/11/2018   15 x 20 cm intraperitoneal Ventralex mesh  ;  Surgeon: Robert Bellow, MD;  Location: [facility] ORS;  Service: General;  Laterality: N/A;    Family History  Problem Relation Age of Onset  . Cancer Father        lung  . Alzheimer's disease Mother   . Heart attack Brother   . Heart attack Brother     Social History Social History   Tobacco Use  . Smoking status: Never Smoker  . Smokeless tobacco: Former Systems developer    Types: Chew  Substance Use Topics  . Alcohol use: Yes    Comment: occasional  . Drug use: No    No Known Allergies  Current Outpatient Medications  Medication Sig Dispense Refill  . clonazePAM (KLONOPIN) 1 MG tablet Take 1 tablet (1 mg total) by mouth 2 (two) times daily as needed for anxiety. 45 tablet 4  .  cyclobenzaprine (FLEXERIL) 10 MG tablet   2  . esomeprazole (NEXIUM) 20 MG capsule Take 1 capsule  by mouth every morning. 90 capsule 0   No current facility-administered medications for this visit.     Review of Systems Review of Systems  Constitutional: Negative.   Respiratory: Negative.   Cardiovascular: Negative.   Gastrointestinal: Positive for abdominal pain and diarrhea. Negative for constipation.    Blood pressure (!) 133/98, pulse (!) 105, temperature 97.7 F (36.5 C), temperature source Temporal, resp. rate 16, height 5\' 9"  (1.753 m), weight 273 lb (123.8 kg), SpO2 97 %.  Physical Exam Physical Exam Exam conducted with a chaperone present.  Constitutional:      Appearance: He is well-developed.  HENT:     Mouth/Throat:     Pharynx: No oropharyngeal exudate.  Eyes:     General: No scleral icterus.    Conjunctiva/sclera: Conjunctivae normal.  Neck:     Musculoskeletal: Neck supple.  Cardiovascular:     Rate and Rhythm: Normal rate and regular rhythm.     Heart sounds: Normal heart sounds.  Pulmonary:     Effort: Pulmonary effort is normal.     Breath sounds: Normal breath sounds.  Abdominal:  General: Bowel sounds are normal.     Palpations: Abdomen is soft.     Hernia: A hernia is present. Hernia is present in the ventral area.       Comments: recurrent ventral hernia  Skin:    General: Skin is warm and dry.  Neurological:     Mental Status: He is alert and oriented to person, place, and time.  Psychiatric:        Behavior: Behavior normal.     Data Reviewed CT pending.  CT of the abdomen and pelvis was reviewed.  There shows eventration at the level of the umbilicus but no fascial defect on either coronal or axial images.  Similar to 2019.  2008 CT reviewed as well.  Significant increase in AP diameter of the abdominal from 12 to 20 cm.  This likely corresponds with a 45 pound weight gain.  No clear evidence of fascial dehiscence, there is  evidence of lateralization of the rectus muscles producing increased distortion of the level of prior surgical repairs.  A phone message was left for the patient with results of today's scan.  No evidence of any bowel dilatation or obstruction, again last night and this morning's diarrhea likely related to dietary indiscretion.  There is evidence of a significant increase in a size of a hiatal hernia, again commensurate with the patient's weight gain.  Assessment Recurrent ventral hernia.  Plan  Schedule CT scan to assess intra-abdominal contents.  No clear explanation for abdominal pain this morning based on present exam.  Ms. Claudina Lick asked if the mesh might be defective, as ultrasound last visit had shown a soft undulation when the patient was supine, but flat when standing.  I explained that meshes tend to fail with fracture rather than other mechanisms and I would not have suspected mesh failure.  The patient reports he is been careful with lifting and has not done anything particularly strenuous.  HPI, assessment, plan and physical exam has been scribed under the direction and in the presence of Robert Bellow, MD. Karie Fetch, RN  I have completed the exam and reviewed the above documentation for accuracy and completeness.  I agree with the above.  Haematologist has been used and any errors in dictation or transcription are unintentional.  Hervey Ard, M.D., F.A.C.S.  [name]  03/14/2019, 11:34 AM  Patient has been scheduled for a CT abdomen/pelvis with contrast STAT at [place] for today. Prep: nothing else to eat or drink.   The patient is aware he may leave once CT has been completed.  Dominga Ferry, CMA

## 2019-03-26 ENCOUNTER — Telehealth: Payer: Self-pay

## 2019-03-26 NOTE — Telephone Encounter (Signed)
PA submitted via cover my meds for esomeprazole. Awaiting for approval or denial.

## 2019-03-28 ENCOUNTER — Telehealth: Payer: Self-pay | Admitting: Family Medicine

## 2019-03-28 NOTE — Telephone Encounter (Signed)
Lanette from [facility], left vm that auth for esomeprazole has been denied. Any additional questions number is [phone]

## 2019-04-01 NOTE — Telephone Encounter (Signed)
Prior Josem Kaufmann was denied. We should receive a fax regarding reasoning.  Routing to provider as Juluis Rainier

## 2019-04-04 ENCOUNTER — Emergency Department (HOSPITAL_COMMUNITY): Payer: No Typology Code available for payment source | Admitting: Certified Registered Nurse Anesthetist

## 2019-04-04 ENCOUNTER — Emergency Department (HOSPITAL_COMMUNITY): Payer: No Typology Code available for payment source

## 2019-04-04 ENCOUNTER — Other Ambulatory Visit: Payer: Self-pay

## 2019-04-04 ENCOUNTER — Ambulatory Visit (HOSPITAL_COMMUNITY)
Admission: EM | Admit: 2019-04-04 | Discharge: 2019-04-04 | Disposition: A | Discharge status: Home / Self Care | Payer: No Typology Code available for payment source | Attending: Emergency Medicine | Admitting: Emergency Medicine

## 2019-04-04 ENCOUNTER — Encounter (HOSPITAL_COMMUNITY)
Admission: EM | Disposition: A | Discharge status: Home / Self Care | Payer: Self-pay | Source: Home / Self Care | Attending: Emergency Medicine

## 2019-04-04 ENCOUNTER — Encounter (HOSPITAL_COMMUNITY): Payer: Self-pay

## 2019-04-04 DIAGNOSIS — Z87891 Personal history of nicotine dependence: Secondary | ICD-10-CM | POA: Insufficient documentation

## 2019-04-04 DIAGNOSIS — S62635B Displaced fracture of distal phalanx of left ring finger, initial encounter for open fracture: Secondary | ICD-10-CM | POA: Diagnosis not present

## 2019-04-04 DIAGNOSIS — Y990 Civilian activity done for income or pay: Secondary | ICD-10-CM | POA: Insufficient documentation

## 2019-04-04 DIAGNOSIS — S62633B Displaced fracture of distal phalanx of left middle finger, initial encounter for open fracture: Secondary | ICD-10-CM | POA: Insufficient documentation

## 2019-04-04 DIAGNOSIS — Z79899 Other long term (current) drug therapy: Secondary | ICD-10-CM | POA: Diagnosis not present

## 2019-04-04 DIAGNOSIS — W312XXA Contact with powered woodworking and forming machines, initial encounter: Secondary | ICD-10-CM | POA: Insufficient documentation

## 2019-04-04 DIAGNOSIS — G4733 Obstructive sleep apnea (adult) (pediatric): Secondary | ICD-10-CM | POA: Diagnosis not present

## 2019-04-04 DIAGNOSIS — K219 Gastro-esophageal reflux disease without esophagitis: Secondary | ICD-10-CM | POA: Diagnosis not present

## 2019-04-04 DIAGNOSIS — S61112A Laceration without foreign body of left thumb with damage to nail, initial encounter: Secondary | ICD-10-CM | POA: Diagnosis not present

## 2019-04-04 DIAGNOSIS — F419 Anxiety disorder, unspecified: Secondary | ICD-10-CM | POA: Insufficient documentation

## 2019-04-04 DIAGNOSIS — M1990 Unspecified osteoarthritis, unspecified site: Secondary | ICD-10-CM | POA: Insufficient documentation

## 2019-04-04 DIAGNOSIS — S61217A Laceration without foreign body of left little finger without damage to nail, initial encounter: Secondary | ICD-10-CM | POA: Insufficient documentation

## 2019-04-04 DIAGNOSIS — S6292XB Unspecified fracture of left wrist and hand, initial encounter for open fracture: Secondary | ICD-10-CM

## 2019-04-04 HISTORY — PX: NERVE AND TENDON REPAIR: SHX5693

## 2019-04-04 HISTORY — PX: LACERATION REPAIR: SHX5284

## 2019-04-04 HISTORY — PX: MINOR NAILBED REPAIR: SHX5979

## 2019-04-04 LAB — HEMOGLOBIN: Hemoglobin: 12.1 g/dL — ABNORMAL LOW (ref 13.0–17.0)

## 2019-04-04 SURGERY — NERVE AND TENDON REPAIR
Anesthesia: General | Site: Finger | Laterality: Left

## 2019-04-04 MED ORDER — BUPIVACAINE HCL (PF) 0.25 % IJ SOLN
INTRAMUSCULAR | Status: DC | PRN
  Administered 2019-04-04: 16 mL

## 2019-04-04 MED ORDER — DEXTROSE 5 % IV SOLN
3.0000 g | INTRAVENOUS | Status: DC
  Filled 2019-04-04: qty 3000

## 2019-04-04 MED ORDER — ONDANSETRON HCL 4 MG/2ML IJ SOLN
INTRAMUSCULAR | Status: AC
  Filled 2019-04-04: qty 2

## 2019-04-04 MED ORDER — LACTATED RINGERS IV SOLN
INTRAVENOUS | Status: DC | PRN
  Administered 2019-04-04: 17:00:00 via INTRAVENOUS

## 2019-04-04 MED ORDER — FENTANYL CITRATE (PF) 250 MCG/5ML IJ SOLN
INTRAMUSCULAR | Status: AC
  Filled 2019-04-04: qty 5

## 2019-04-04 MED ORDER — POVIDONE-IODINE 10 % EX SWAB: 2.0000 "application " | Freq: Once | CUTANEOUS | Status: DC

## 2019-04-04 MED ORDER — DEXAMETHASONE SODIUM PHOSPHATE 10 MG/ML IJ SOLN
INTRAMUSCULAR | Status: AC
  Filled 2019-04-04: qty 1

## 2019-04-04 MED ORDER — FENTANYL CITRATE (PF) 100 MCG/2ML IJ SOLN
INTRAMUSCULAR | Status: DC | PRN
  Administered 2019-04-04: 100 ug via INTRAVENOUS
  Administered 2019-04-04: 50 ug via INTRAVENOUS
  Administered 2019-04-04: 100 ug via INTRAVENOUS

## 2019-04-04 MED ORDER — BUPIVACAINE HCL (PF) 0.25 % IJ SOLN
INTRAMUSCULAR | Status: AC
  Filled 2019-04-04: qty 10

## 2019-04-04 MED ORDER — PROPOFOL 10 MG/ML IV BOLUS
INTRAVENOUS | Status: DC | PRN
  Administered 2019-04-04: 300 mg via INTRAVENOUS
  Administered 2019-04-04: 50 mg via INTRAVENOUS

## 2019-04-04 MED ORDER — SUCCINYLCHOLINE CHLORIDE 200 MG/10ML IV SOSY
PREFILLED_SYRINGE | INTRAVENOUS | Status: DC | PRN
  Administered 2019-04-04: 140 mg via INTRAVENOUS

## 2019-04-04 MED ORDER — ONDANSETRON HCL 4 MG/2ML IJ SOLN
INTRAMUSCULAR | Status: DC | PRN
  Administered 2019-04-04: 4 mg via INTRAVENOUS

## 2019-04-04 MED ORDER — HYDROCODONE-ACETAMINOPHEN 5-325 MG PO TABS: ORAL_TABLET | ORAL | 0 refills | Status: DC

## 2019-04-04 MED ORDER — CHLORHEXIDINE GLUCONATE 4 % EX LIQD
60.0000 mL | Freq: Once | CUTANEOUS | Status: DC
  Filled 2019-04-04: qty 60

## 2019-04-04 MED ORDER — SULFAMETHOXAZOLE-TRIMETHOPRIM 800-160 MG PO TABS: 1.0000 | ORAL_TABLET | Freq: Two times a day (BID) | ORAL | 0 refills | Status: DC

## 2019-04-04 MED ORDER — MIDAZOLAM HCL 5 MG/5ML IJ SOLN
INTRAMUSCULAR | Status: DC | PRN
  Administered 2019-04-04: 2 mg via INTRAVENOUS

## 2019-04-04 MED ORDER — LACTATED RINGERS IV SOLN
INTRAVENOUS | Status: DC
  Administered 2019-04-04: 17:00:00 via INTRAVENOUS

## 2019-04-04 MED ORDER — LIDOCAINE 2% (20 MG/ML) 5 ML SYRINGE
INTRAMUSCULAR | Status: DC | PRN
  Administered 2019-04-04: 60 mg via INTRAVENOUS

## 2019-04-04 MED ORDER — DEXAMETHASONE SODIUM PHOSPHATE 10 MG/ML IJ SOLN
INTRAMUSCULAR | Status: DC | PRN
  Administered 2019-04-04: 10 mg via INTRAVENOUS

## 2019-04-04 MED ORDER — PHENYLEPHRINE 40 MCG/ML (10ML) SYRINGE FOR IV PUSH (FOR BLOOD PRESSURE SUPPORT)
PREFILLED_SYRINGE | INTRAVENOUS | Status: AC
  Filled 2019-04-04: qty 10

## 2019-04-04 MED ORDER — PROPOFOL 10 MG/ML IV BOLUS
INTRAVENOUS | Status: AC
  Filled 2019-04-04: qty 20

## 2019-04-04 MED ORDER — CEFAZOLIN SODIUM 1 G IJ SOLR
INTRAMUSCULAR | Status: AC
  Filled 2019-04-04: qty 30

## 2019-04-04 MED ORDER — CEFAZOLIN SODIUM-DEXTROSE 2-4 GM/100ML-% IV SOLN
2.0000 g | Freq: Once | INTRAVENOUS | Status: AC
  Administered 2019-04-04: 2 g via INTRAVENOUS
  Filled 2019-04-04: qty 100

## 2019-04-04 MED ORDER — ACETAMINOPHEN 325 MG PO TABS
650.0000 mg | ORAL_TABLET | Freq: Once | ORAL | Status: AC
  Administered 2019-04-04: 650 mg via ORAL
  Filled 2019-04-04: qty 2

## 2019-04-04 MED ORDER — LIDOCAINE 2% (20 MG/ML) 5 ML SYRINGE
INTRAMUSCULAR | Status: AC
  Filled 2019-04-04: qty 5

## 2019-04-04 MED ORDER — MIDAZOLAM HCL 2 MG/2ML IJ SOLN
INTRAMUSCULAR | Status: AC
  Filled 2019-04-04: qty 2

## 2019-04-04 MED ORDER — DEXTROSE 5 % IV SOLN
INTRAVENOUS | Status: DC | PRN
  Administered 2019-04-04: 3 g via INTRAVENOUS

## 2019-04-04 MED ORDER — SUCCINYLCHOLINE CHLORIDE 200 MG/10ML IV SOSY
PREFILLED_SYRINGE | INTRAVENOUS | Status: AC
  Filled 2019-04-04: qty 10

## 2019-04-04 MED ORDER — 0.9 % SODIUM CHLORIDE (POUR BTL) OPTIME
TOPICAL | Status: DC | PRN
  Administered 2019-04-04: 1000 mL

## 2019-04-04 SURGICAL SUPPLY — 51 items
BANDAGE ACE 3X5.8 VEL STRL LF (GAUZE/BANDAGES/DRESSINGS) IMPLANT
BANDAGE ACE 4X5 VEL STRL LF (GAUZE/BANDAGES/DRESSINGS) IMPLANT
BANDAGE COBAN STERILE 2 (GAUZE/BANDAGES/DRESSINGS) IMPLANT
BNDG COHESIVE 1X5 TAN STRL LF (GAUZE/BANDAGES/DRESSINGS) ×3 IMPLANT
BNDG ESMARK 4X9 LF (GAUZE/BANDAGES/DRESSINGS) ×3 IMPLANT
BNDG GAUZE ELAST 4 BULKY (GAUZE/BANDAGES/DRESSINGS) IMPLANT
CORDS BIPOLAR (ELECTRODE) ×3 IMPLANT
COVER SURGICAL LIGHT HANDLE (MISCELLANEOUS) ×3 IMPLANT
CUFF TOURNIQUET SINGLE 18IN (TOURNIQUET CUFF) IMPLANT
CUFF TOURNIQUET SINGLE 24IN (TOURNIQUET CUFF) ×3 IMPLANT
DECANTER SPIKE VIAL GLASS SM (MISCELLANEOUS) ×3 IMPLANT
DRAIN PENROSE 1/4X12 LTX STRL (WOUND CARE) IMPLANT
DRSG PAD ABDOMINAL 8X10 ST (GAUZE/BANDAGES/DRESSINGS) IMPLANT
DRSG XEROFORM 1X8 (GAUZE/BANDAGES/DRESSINGS) ×3 IMPLANT
ELECT CAUTERY BLADE 6.4 (BLADE) ×3 IMPLANT
GAUZE SPONGE 4X4 12PLY STRL (GAUZE/BANDAGES/DRESSINGS) IMPLANT
GAUZE SPONGE 4X4 12PLY STRL LF (GAUZE/BANDAGES/DRESSINGS) ×3 IMPLANT
GAUZE XEROFORM 1X8 LF (GAUZE/BANDAGES/DRESSINGS) IMPLANT
GLOVE BIO SURGEON STRL SZ7.5 (GLOVE) ×6 IMPLANT
GLOVE BIOGEL PI IND STRL 8 (GLOVE) ×4 IMPLANT
GLOVE BIOGEL PI INDICATOR 8 (GLOVE) ×2
GOWN STRL REUS W/ TWL LRG LVL3 (GOWN DISPOSABLE) ×4 IMPLANT
GOWN STRL REUS W/ TWL XL LVL3 (GOWN DISPOSABLE) ×2 IMPLANT
GOWN STRL REUS W/TWL LRG LVL3 (GOWN DISPOSABLE) ×2
GOWN STRL REUS W/TWL XL LVL3 (GOWN DISPOSABLE) ×1
KIT BASIN OR (CUSTOM PROCEDURE TRAY) ×3 IMPLANT
KIT TURNOVER KIT B (KITS) ×3 IMPLANT
LOOP VESSEL MAXI BLUE (MISCELLANEOUS) IMPLANT
MANIFOLD NEPTUNE II (INSTRUMENTS) IMPLANT
NEEDLE HYPO 25X1 1.5 SAFETY (NEEDLE) ×3 IMPLANT
NS IRRIG 1000ML POUR BTL (IV SOLUTION) ×3 IMPLANT
PACK ORTHO EXTREMITY (CUSTOM PROCEDURE TRAY) ×3 IMPLANT
PAD ARMBOARD 7.5X6 YLW CONV (MISCELLANEOUS) ×6 IMPLANT
SCRUB BETADINE 4OZ XXX (MISCELLANEOUS) ×3 IMPLANT
SET CYSTO W/LG BORE CLAMP LF (SET/KITS/TRAYS/PACK) IMPLANT
SOL PREP POV-IOD 4OZ 10% (MISCELLANEOUS) ×3 IMPLANT
SPLINT FINGER (SOFTGOODS) ×3 IMPLANT
SPLINT FINGER 5.25 W/BULB ALUM (SOFTGOODS) ×3 IMPLANT
SPONGE LAP 4X18 RFD (DISPOSABLE) ×3 IMPLANT
SUT CHROMIC 6 0 PS 4 (SUTURE) ×3 IMPLANT
SUT ETHILON 4 0 P 3 18 (SUTURE) IMPLANT
SUT ETHILON 4 0 PS 2 18 (SUTURE) IMPLANT
SUT MON AB 5-0 P3 18 (SUTURE) ×6 IMPLANT
SUT PROLENE 6 0 P 1 18 (SUTURE) IMPLANT
SWAB COLLECTION DEVICE MRSA (MISCELLANEOUS) IMPLANT
SYR CONTROL 10ML LL (SYRINGE) ×3 IMPLANT
TOWEL OR 17X26 10 PK STRL BLUE (TOWEL DISPOSABLE) ×3 IMPLANT
TUBE CONNECTING 12X1/4 (SUCTIONS) ×3 IMPLANT
TUBE FEEDING ENTERAL 5FR 16IN (TUBING) IMPLANT
UNDERPAD 30X30 (UNDERPADS AND DIAPERS) ×3 IMPLANT
YANKAUER SUCT BULB TIP NO VENT (SUCTIONS) ×3 IMPLANT

## 2019-04-04 NOTE — ED Notes (Signed)
Contact pt's girlfriend [name] @ [phone] when pt is out of surgery.  She is his ride and will be waiting.

## 2019-04-04 NOTE — Op Note (Signed)
NAME: Hickman RECORD NO: [id number] DATE OF BIRTH: 1966/04/07 FAC[facility] LOCATION: [facility] OR PHYSICIAN: Tennis Must, MD   OPERATIVE REPORT   DATE OF PROCEDURE: 04/04/19    PREOPERATIVE DIAGNOSIS:   Left thumb laceration, left long and ring finger nailbed lacerations   POSTOPERATIVE DIAGNOSIS:   1.  Left thumb pad laceration 2.  Left long finger open distal phalanx fracture and skin and nailbed lacerations 3.  Left ring finger open distal phalanx fracture and skin and nailbed lacerations   PROCEDURE:   1.  Left long finger irrigation debridement of open distal phalanx fracture including skin and subcutaneous tissues 2.  Left long finger repair of skin and nailbed lacerations 3.  Left ring finger irrigation debridement of open distal phalanx fracture including skin and subcutaneous tissues 4.  Left ring finger repair of skin and nailbed lacerations   SURGEON:  Leanora Cover, M.D.   ASSISTANT:    ANESTHESIA:  General   INTRAVENOUS FLUIDS:  Per anesthesia flow sheet.   ESTIMATED BLOOD LOSS:  Minimal.   COMPLICATIONS:  None.   SPECIMENS:  none   TOURNIQUET TIME:    Total Tourniquet Time Documented: Upper Arm (Left) - 46 minutes Total: Upper Arm (Left) - 46 minutes    DISPOSITION:  Stable to PACU.   INDICATIONS: 53 year old male states he sustained lacerations to the left thumb long and ring fingers with a table saw while at work today.  He was seen in the emergency department where he was found to have nailbed lacerations of the long and ring fingers.  Recommended irrigation and debridement with repair in the operating room. Risks, benefits and alternatives of surgery were discussed including the risks of blood loss, infection, damage to nerves, vessels, tendons, ligaments, bone for surgery, need for additional surgery, complications with wound healing, continued pain, nonunion, malunion, stiffness.  He voiced understanding of these risks and elected to  proceed.  OPERATIVE COURSE:  After being identified preoperatively by myself,  the patient and I agreed on the procedure and site of the procedure.  The surgical site was marked.  Surgical consent had been signed. He was given IV antibiotics as preoperative antibiotic prophylaxis. He was transferred to the operating room and placed on the operating table in supine position with the Left upper extremity on an arm board.  General anesthesia was induced by the anesthesiologist.  Left upper extremity was prepped and draped in normal sterile orthopedic fashion.  A surgical pause was performed between the surgeons, anesthesia, and operating room staff and all were in agreement as to the patient, procedure, and site of procedure.  Tourniquet at the proximal aspect of the extremity was inflated to 250 mmHg after exsanguination of the arm with an Esmarch bandage.    The wounds were explored.  The laceration the pad of the thumb was into the subcutaneous tissues but did not require suturing.  The long finger laceration was at the distal ulnar aspect and had removed that portion of the nailbed.  The tuft was exposed with a small piece missing at the distal ulnar side.  In the ring finger the laceration went through the nailbed obliquely and then coursed from the proximal ulnar corner of the nailbed into the skin into the middle phalanx.  There was a groove taken out of the distal phalanx from the curve of the sawblade.  There is no gross contamination.  All wounds were debrided including removal of skin and subcutaneous tissues with a  knife.  The wounds were then all copiously irrigated with sterile saline.  In the long finger the skin was reapproximated with a 5-0 Monocryl suture in 6-0 chromic suture used to reapproximate the skin to the remaining nailbed.  In the ring finger 5-0 Monocryl suture was used to reapproximate skin edges and 6-0 chromic suture used to reapproximate the nailbed.  There was damage to the nailbed  centrally with fraying of the tissue.  Bridging type suture across his whole area was used which allowed good soft tissue coverage over the bone.  Digital blocks were performed with quarter percent plain Marcaine to aid in postoperative analgesia.  Xeroform was placed in the nail folds and the wounds all dressed with sterile Xeroform 4 x 4's and wrapped with Coban dressing lightly.  AlumaFoam splints were placed on the long and ring fingers and wrapped lightly with Coban dressing.  The tourniquet was deflated at 46 minutes.  Fingertips were pink with brisk capillary refill after deflation of tourniquet.  The operative  drapes were broken down.  The patient was awoken from anesthesia safely.  He was transferred back to the stretcher and taken to PACU in stable condition.  I will see him back in the office in 1 week for postoperative followup.  I will give him a prescription for Norco 5/325 1-2 tabs PO q6 hours prn pain, dispense # 20 and Bactrim DS 1 p.o. twice daily x7 days.   Leanora Cover, MD Electronically signed, 04/04/19

## 2019-04-04 NOTE — ED Triage Notes (Signed)
Pt was at work, cut hand on sawmill. Grazed the side of his hand, lacerations to middle finger and thumb

## 2019-04-04 NOTE — H&P (Signed)
Johnny Rios is an 53 y.o. male.   Chief Complaint: Left thumb long and ring finger lacerations HPI: 53 year old right-hand-dominant male states he was using a table saw at work today when he sustained lacerations to the right thumb, long, ring fingers.  He was seen in the emergency department was noted to have nailbed lacerations of the long and ring fingers.  Radiographs were taken showing no fractures.  He reports no previous injury to the hand and no other injuries at this time.  He states he is in minimal pain at this point.  There is associated wound and bleeding.  There are no alleviating or aggravating factors.  Xrays viewed and interpreted by me: AP lateral oblique views of the left hand show no fracture dislocation or radiopaque foreign body. Labs reviewed: None  Allergies: No Known Allergies  Past Medical History:  Diagnosis Date  . Anxiety   . Arthritis   . Cancer [facility])    SKIN CANCER  . GERD (gastroesophageal reflux disease)   . Sleep apnea    Uses CPAP  . Tachycardia     Past Surgical History:  Procedure Laterality Date  . APPENDECTOMY    . CHOLECYSTECTOMY N/A 01/19/2017   Procedure: LAPAROSCOPIC CHOLECYSTECTOMY WITH INTRAOPERATIVE CHOLANGIOGRAM;  Surgeon: Johnny Bellow, MD;  Location: [facility] ORS;  Service: General;  Laterality: N/A;  . TONSILLECTOMY    . UMBILICAL HERNIA REPAIR N/A 06/12/2017   Ventral hernia with 6 cm Ventralex ST mesh.  . VENTRAL HERNIA REPAIR N/A 07/11/2018   15 x 20 cm intraperitoneal Ventralex mesh  ;  Surgeon: Johnny Bellow, MD;  Location: [facility] ORS;  Service: General;  Laterality: N/A;    Family History: Family History  Problem Relation Age of Onset  . Cancer Father        lung  . Alzheimer's disease Mother   . Heart attack Brother   . Heart attack Brother     Social History:   reports that he has never smoked. He has quit using smokeless tobacco.  His smokeless tobacco use included chew. He reports current alcohol use. He  reports that he does not use drugs.  Medications: Medications Prior to Admission  Medication Sig Dispense Refill  . clonazePAM (KLONOPIN) 1 MG tablet Take 1 tablet (1 mg total) by mouth 2 (two) times daily as needed for anxiety. 45 tablet 4  . esomeprazole (NEXIUM) 20 MG capsule Take 1 capsule  by mouth every morning. (Patient taking differently: Take 20 mg by mouth daily. ) 90 capsule 0    Results for orders placed or performed during the hospital encounter of 04/04/19 (from the past 48 hour(s))  Hemoglobin     Status: Abnormal   Collection Time: 04/04/19  4:58 PM  Result Value Ref Range   Hemoglobin 12.1 (L) 13.0 - 17.0 g/dL    Comment: Performed at [facility], [place] [address] [place], [place] [address]    Dg Hand Complete Left  Result Date: 04/04/2019 CLINICAL DATA:  Laceration to the left hand, across the third, fourth and fifth fingers. EXAM: LEFT HAND - COMPLETE 3+ VIEW COMPARISON:  06/29/2010 FINDINGS: Subtle cortical defect along the dorsal aspect of the distal phalanx of the ring finger. This is only evident on the lateral view. No other evidence of a fracture or skeletal injury. Soft tissue injury is noted across the finger tips of the third, fourth and fifth fingers. This is most evident involving the third finger. No radiopaque foreign bodies.  Joints are normally aligned. IMPRESSION: 1. Subtle cortical defect deep to an overlying soft tissue laceration along the dorsal aspect of the distal phalanx of the fourth/ring finger. No full-thickness fracture. No other evidence of a skeletal injury. 2. Soft tissue lacerations over the dorsal tips of the third, fourth and fifth fingers. No radiopaque foreign body. Electronically Signed   By: Johnny Rios M.D.   On: 04/04/2019 11:49     A comprehensive review of systems was negative. Review of Systems: No fevers, chills, night sweats, chest pain, shortness of breath, nausea, vomiting, diarrhea, constipation, easy bleeding or  bruising, headaches, dizziness, vision changes, fainting.   Blood pressure (!) 138/92, pulse 100, temperature 98.2 F (36.8 C), temperature source Oral, resp. rate 16, height 5\' 9"  (1.753 m), weight 122.5 kg, SpO2 98 %.  General appearance: alert, cooperative and appears stated age Head: Normocephalic, without obvious abnormality, atraumatic Neck: supple, symmetrical, trachea midline Resp: clear to auscultation bilaterally Cardio: regular rate and rhythm Extremities: Intact sensation and capillary refill all digits.  +epl/fpl/io.  There is a laceration in the pad of the thumb just into the subcutaneous tissue there is laceration on the dorsum of the long finger involving the distal ulnar aspect of the nailbed and the adjacent skin.  There is laceration through the nailbed of the ring finger.  Able to flex and extend the DIP joints. Pulses: 2+ and symmetric Skin: Skin color, texture, turgor normal. No rashes or lesions Neurologic: Grossly normal Incision/Wound: As above  Assessment/Plan Left thumb pad laceration and long and ring finger nailbed lacerations.  Recommended irrigation debridement of the wounds with reapproximation of nailbed tissue in the operating room.  Risks, benefits and alternatives of surgery were discussed including risks of blood loss, infection, damage to nerves/vessels/tendons/ligament/bone, failure of surgery, need for additional surgery, complication with wound healing, stiffness, nail deformity.  He voiced understanding of these risks and elected to proceed.    Johnny Rios 04/04/2019, 6:21 PM

## 2019-04-04 NOTE — ED Provider Notes (Signed)
[facility] EMERGENCY DEPARTMENT Provider Note   CSN: [id number] Arrival date & time: 04/04/19  1059    History   Chief Complaint Chief Complaint  Patient presents with  . Extremity Laceration    HPI Johnny Rios is a 53 y.o. male.     52 y.o male with a PMH of Anxiety,GERD presents to the ED via EMS with a chief complaint of laceration to the left hand x 1 hour ago. Patient was cutting with a sawmill when the machine graced over the top of his left hand cutting his middle, ring, thumb fingers. He reports applying pressure to control the bleeding. No medical therapy has been taken for relieve in pain or provided by EMS. He has full ROM, but reports numbness to middle finger. He states his last tetanus shot was given approximately within 5 years. He denies any other complaint or this time or blood thinner use.      Past Medical History:  Diagnosis Date  . Anxiety   . Arthritis   . Cancer [facility])    SKIN CANCER  . GERD (gastroesophageal reflux disease)   . Sleep apnea    Uses CPAP  . Tachycardia     Patient Active Problem List   Diagnosis Date Noted  . Recurrent ventral hernia 07/05/2018  . Rectus diastasis 07/05/2018  . Lumbar radiculopathy 04/23/2018  . DDD (degenerative disc disease), lumbar 04/16/2018  . Low testosterone 10/09/2017  . Elevated glucose 09/26/2017  . Mixed hyperlipidemia 09/26/2017  . Chest pain 07/26/2017  . Chronic fatigue 07/26/2017  . Gallstones 01/04/2017  . PUD (peptic ulcer disease) 10/31/2016  . Obesity 12/02/2015  . Tachycardia 12/02/2015  . OSA on CPAP 12/02/2015  . Anxiety     Past Surgical History:  Procedure Laterality Date  . APPENDECTOMY    . CHOLECYSTECTOMY N/A 01/19/2017   Procedure: LAPAROSCOPIC CHOLECYSTECTOMY WITH INTRAOPERATIVE CHOLANGIOGRAM;  Surgeon: Robert Bellow, MD;  Location: [facility] ORS;  Service: General;  Laterality: N/A;  . TONSILLECTOMY    . UMBILICAL HERNIA REPAIR N/A 06/12/2017   Ventral hernia with 6 cm Ventralex ST mesh.  . VENTRAL HERNIA REPAIR N/A 07/11/2018   15 x 20 cm intraperitoneal Ventralex mesh  ;  Surgeon: Robert Bellow, MD;  Location: [facility] ORS;  Service: General;  Laterality: N/A;        Home Medications    Prior to Admission medications   Medication Sig Start Date End Date Taking? Authorizing Provider  clonazePAM (KLONOPIN) 1 MG tablet Take 1 tablet (1 mg total) by mouth 2 (two) times daily as needed for anxiety. 10/22/18   Guadalupe Maple, MD  cyclobenzaprine (FLEXERIL) 10 MG tablet  09/11/18   [provider]  esomeprazole (NEXIUM) 20 MG capsule Take 1 capsule  by mouth every morning. 10/12/18   Guadalupe Maple, MD    Family History Family History  Problem Relation Age of Onset  . Cancer Father        lung  . Alzheimer's disease Mother   . Heart attack Brother   . Heart attack Brother     Social History Social History   Tobacco Use  . Smoking status: Never Smoker  . Smokeless tobacco: Former Systems developer    Types: Chew  Substance Use Topics  . Alcohol use: Yes    Comment: occasional  . Drug use: No     Allergies   Patient has no known allergies.   Review of Systems Review of Systems  Constitutional:  Negative for fever.  HENT: Negative for sore throat.   Respiratory: Negative for shortness of breath.   Cardiovascular: Negative for chest pain.  Gastrointestinal: Negative for abdominal pain.  Skin: Positive for wound.  Neurological: Negative for light-headedness and numbness.     Physical Exam Updated Vital Signs BP (!) 138/92   Pulse 100   Temp 98.2 F (36.8 C) (Oral)   Resp 16   Ht 5\' 9"  (1.753 m)   Wt 122.5 kg   SpO2 98%   BMI 39.87 kg/m   Physical Exam Vitals signs and nursing note reviewed.  Constitutional:      General: He is not in acute distress.    Appearance: He is well-developed. He is not ill-appearing.  HENT:     Head: Normocephalic and atraumatic.  Eyes:     General: No scleral  icterus.    Pupils: Pupils are equal, round, and reactive to light.  Neck:     Musculoskeletal: Normal range of motion.  Cardiovascular:     Heart sounds: Normal heart sounds.  Pulmonary:     Effort: Pulmonary effort is normal.     Breath sounds: Normal breath sounds. No wheezing.  Chest:     Chest wall: No tenderness.  Abdominal:     General: Bowel sounds are normal. There is no distension.     Palpations: Abdomen is soft.     Tenderness: There is no abdominal tenderness.  Musculoskeletal:        General: No deformity.     Left hand: He exhibits decreased range of motion, tenderness, decreased capillary refill, laceration and swelling. Normal sensation noted. Decreased sensation is not present in the ulnar distribution and is not present in the medial redistribution. Normal strength noted. He exhibits no finger abduction and no wrist extension trouble.     Comments: Laceration noted to middle, ringer and thumb finger. Laceration in middle looks involved. Complaints of numbness along fingertip.  Please see picture attached.    Skin:    General: Skin is warm and dry.  Neurological:     Mental Status: He is alert and oriented to person, place, and time.              ED Treatments / Results  Labs (all labs ordered are listed, but only abnormal results are displayed) Labs Reviewed - No data to display  EKG None  Radiology Dg Hand Complete Left  Result Date: 04/04/2019 CLINICAL DATA:  Laceration to the left hand, across the third, fourth and fifth fingers. EXAM: LEFT HAND - COMPLETE 3+ VIEW COMPARISON:  06/29/2010 FINDINGS: Subtle cortical defect along the dorsal aspect of the distal phalanx of the ring finger. This is only evident on the lateral view. No other evidence of a fracture or skeletal injury. Soft tissue injury is noted across the finger tips of the third, fourth and fifth fingers. This is most evident involving the third finger. No radiopaque foreign bodies.  Joints are normally aligned. IMPRESSION: 1. Subtle cortical defect deep to an overlying soft tissue laceration along the dorsal aspect of the distal phalanx of the fourth/ring finger. No full-thickness fracture. No other evidence of a skeletal injury. 2. Soft tissue lacerations over the dorsal tips of the third, fourth and fifth fingers. No radiopaque foreign body. Electronically Signed   By: Lajean Manes M.D.   On: 04/04/2019 11:49    Procedures Procedures (including critical care time)  Medications Ordered in ED Medications  acetaminophen (TYLENOL) tablet 650 mg (650 mg  Oral Given 04/04/19 1126)  ceFAZolin (ANCEF) IVPB 2g/100 mL premix (2 g Intravenous New Bag/Given 04/04/19 1253)     Initial Impression / Assessment and Plan / ED Course  I have reviewed the triage vital signs and the nursing notes.  Pertinent labs & imaging results that were available during my care of the patient were reviewed by me and considered in my medical decision making (see chart for details).     Patient presents with chief complaint of a laceration to the left hand while at work x1 hour ago.  Patient reports they saw grinder grazed the dorsum aspect of his left hand.  Please see pictures attached.  Does report some numbness to middle finger.  I have personally reviewed patient's records, his last Tdap vaccine was on July 17, 2014.  Will obtain x-rays along with orthopedist consultation.  11:41 AM spoke to Hilbert Odor who will see patient in the ED.  X-ray currently pending, patient provided with Tylenol for pain relief. Xray showed:  1. Subtle cortical defect deep to an overlying soft tissue laceration along the dorsal aspect of the distal phalanx of the fourth/ring finger. No full-thickness fracture. No other evidence of a skeletal injury. 2. Soft tissue lacerations over the dorsal tips of the third, fourth and fifth fingers. No radiopaque foreign body.  12:29 PM Patient seen by Silvestre Gunner PA, plan  for Dr. Fredna Dow surgical intervention waiting on facility.  Patient provided with IV Ancef 2 g, pending on orthopedics for plan.   Final Clinical Impressions(s) / ED Diagnoses   Final diagnoses:  Open fracture of left hand, initial encounter    ED Discharge Orders    None       Janeece Fitting, PA-C 04/04/19 1357    Tegeler, Gwenyth Allegra, MD 04/04/19 [phone]

## 2019-04-04 NOTE — Anesthesia Postprocedure Evaluation (Signed)
Anesthesia Post Note  Patient: Johnny Rios  Procedure(s) Performed: IRRIGATION AND DEBRIDEMENT OF FIXATION OF OPEN FRACTURES LONG ,RING FINGER (Left Finger) Repair Multiple Lacerations (Left ) Minor Nailbed Repair (Left )     Patient location during evaluation: PACU Anesthesia Type: General Level of consciousness: awake and alert Pain management: pain level controlled Vital Signs Assessment: post-procedure vital signs reviewed and stable Respiratory status: spontaneous breathing, nonlabored ventilation, respiratory function stable and patient connected to nasal cannula oxygen Cardiovascular status: blood pressure returned to baseline and stable Postop Assessment: no apparent nausea or vomiting Anesthetic complications: no    Last Vitals:  Vitals:   04/04/19 1951 04/04/19 2004  BP: (!) 146/84 (!) 130/99  Pulse:  99  Resp: 13 11  Temp: (!) 36.3 C   SpO2: 97% 95%    Last Pain:  Vitals:   04/04/19 1951  TempSrc:   PainSc: 0-No pain                 Shakinah Navis S

## 2019-04-04 NOTE — Transfer of Care (Signed)
Immediate Anesthesia Transfer of Care Note  Patient: Johnny Rios  Procedure(s) Performed: IRRIGATION AND DEBRIDEMENT OF FIXATION OF OPEN FRACTURES LONG ,RING FINGER (Left Finger) Repair Multiple Lacerations (Left ) Minor Nailbed Repair (Left )  Patient Location: PACU  Anesthesia Type:General  Level of Consciousness: oriented, sedated, drowsy, patient cooperative and responds to stimulation  Airway & Oxygen Therapy: Patient Spontanous Breathing and Patient connected to face mask oxygen  Post-op Assessment: Report given to RN, Post -op Vital signs reviewed and stable and Patient moving all extremities X 4  Post vital signs: Reviewed and stable  Last Vitals:  Vitals Value Taken Time  BP 146/84 04/04/2019  7:51 PM  Temp    Pulse 113 04/04/2019  7:50 PM  Resp 13 04/04/2019  7:51 PM  SpO2 100 % 04/04/2019  7:50 PM  Vitals shown include unvalidated device data.  Last Pain:  Vitals:   04/04/19 1106  TempSrc:   PainSc: 10-Worst pain ever         Complications: No apparent anesthesia complications

## 2019-04-04 NOTE — Anesthesia Procedure Notes (Signed)
Procedure Name: Intubation Date/Time: 04/04/2019 6:34 PM Performed by: Orlie Dakin, CRNA Pre-anesthesia Checklist: Patient identified, Emergency Drugs available, Suction available and Patient being monitored Patient Re-evaluated:Patient Re-evaluated prior to induction Oxygen Delivery Method: Circle system utilized Preoxygenation: Pre-oxygenation with 100% oxygen Induction Type: IV induction and Rapid sequence Laryngoscope Size: Miller and 3 Grade View: Grade I Tube type: Oral Tube size: 7.5 mm Number of attempts: 1 Airway Equipment and Method: Stylet Placement Confirmation: ETT inserted through vocal cords under direct vision,  positive ETCO2 and breath sounds checked- equal and bilateral Secured at: 24 cm Tube secured with: Tape Dental Injury: Teeth and Oropharynx as per pre-operative assessment  Comments: RSI due to Covid-19 pandemic concerns.  4x4s bite block used.

## 2019-04-04 NOTE — Anesthesia Preprocedure Evaluation (Addendum)
Anesthesia Evaluation  Patient identified by MRN, date of birth, ID band Patient awake    Reviewed: Allergy & Precautions, H&P , NPO status , Patient's Chart, lab work & pertinent test results  Airway Mallampati: III  TM Distance: >3 FB Neck ROM: Full    Dental no notable dental hx. (+) Dental Advisory Given, Teeth Intact   Pulmonary sleep apnea ,    Pulmonary exam normal breath sounds clear to auscultation       Cardiovascular negative cardio ROS   Rhythm:Regular Rate:Normal     Neuro/Psych Anxiety negative neurological ROS  negative psych ROS   GI/Hepatic Neg liver ROS, PUD, GERD  Medicated and Controlled,  Endo/Other  negative endocrine ROS  Renal/GU negative Renal ROS  negative genitourinary   Musculoskeletal  (+) Arthritis , Osteoarthritis,    Abdominal   Peds  Hematology negative hematology ROS (+)   Anesthesia Other Findings   Reproductive/Obstetrics negative OB ROS                           Anesthesia Physical Anesthesia Plan  ASA: III  Anesthesia Plan: General   Post-op Pain Management:    Induction: Intravenous  PONV Risk Score and Plan: 3 and Ondansetron, Dexamethasone and Midazolam  Airway Management Planned: Oral ETT  Additional Equipment:   Intra-op Plan:   Post-operative Plan: Extubation in OR  Informed Consent: I have reviewed the patients History and Physical, chart, labs and discussed the procedure including the risks, benefits and alternatives for the proposed anesthesia with the patient or authorized representative who has indicated his/her understanding and acceptance.     Dental advisory given  Plan Discussed with: CRNA  Anesthesia Plan Comments:         Anesthesia Quick Evaluation

## 2019-04-04 NOTE — Discharge Instructions (Addendum)

## 2019-04-04 NOTE — Progress Notes (Signed)
Attempted to remove patient's earrings multiple times, unsuccessful. MD notified.

## 2019-04-04 NOTE — ED Provider Notes (Signed)
Patient signed out to me by Chauncey Cruel, PA-C.  Please see previous notes for further history.  In brief, patient presenting with an open hand fracture.  He was given 2 g of Ancef.  Has been evaluated by Hilbert Odor, PA-C from orthopedics, plan to go to the OR with Dr. Fredna Dow.  Pt taken to OR.   Franchot Heidelberg, PA-C 04/04/19 1706    Tegeler, Gwenyth Allegra, MD 04/04/19 [phone]

## 2019-04-04 NOTE — Consult Note (Signed)
Reason for Consult:Finger lacerations Referring Physician: C Tegeler  Johnny Rios is an 53 y.o. male.  HPI: Johnny Rios was working today when the saw he was using kicked back on him and drew his hand into the blade. He had immediate pain. He was brought to the ED and hand surgery was consulted. He is RHD.  Past Medical History:  Diagnosis Date  . Anxiety   . Arthritis   . Cancer [facility])    SKIN CANCER  . GERD (gastroesophageal reflux disease)   . Sleep apnea    Uses CPAP  . Tachycardia     Past Surgical History:  Procedure Laterality Date  . APPENDECTOMY    . CHOLECYSTECTOMY N/A 01/19/2017   Procedure: LAPAROSCOPIC CHOLECYSTECTOMY WITH INTRAOPERATIVE CHOLANGIOGRAM;  Surgeon: Robert Bellow, MD;  Location: [facility] ORS;  Service: General;  Laterality: N/A;  . TONSILLECTOMY    . UMBILICAL HERNIA REPAIR N/A 06/12/2017   Ventral hernia with 6 cm Ventralex ST mesh.  . VENTRAL HERNIA REPAIR N/A 07/11/2018   15 x 20 cm intraperitoneal Ventralex mesh  ;  Surgeon: Robert Bellow, MD;  Location: [facility] ORS;  Service: General;  Laterality: N/A;    Family History  Problem Relation Age of Onset  . Cancer Father        lung  . Alzheimer's disease Mother   . Heart attack Brother   . Heart attack Brother     Social History:  reports that he has never smoked. He has quit using smokeless tobacco.  His smokeless tobacco use included chew. He reports current alcohol use. He reports that he does not use drugs.  Allergies: No Known Allergies  Medications: I have reviewed the patient's current medications.  No results found for this or any previous visit (from the past 48 hour(s)).  Dg Hand Complete Left  Result Date: 04/04/2019 CLINICAL DATA:  Laceration to the left hand, across the third, fourth and fifth fingers. EXAM: LEFT HAND - COMPLETE 3+ VIEW COMPARISON:  06/29/2010 FINDINGS: Subtle cortical defect along the dorsal aspect of the distal phalanx of the ring finger. This is only evident on  the lateral view. No other evidence of a fracture or skeletal injury. Soft tissue injury is noted across the finger tips of the third, fourth and fifth fingers. This is most evident involving the third finger. No radiopaque foreign bodies. Joints are normally aligned. IMPRESSION: 1. Subtle cortical defect deep to an overlying soft tissue laceration along the dorsal aspect of the distal phalanx of the fourth/ring finger. No full-thickness fracture. No other evidence of a skeletal injury. 2. Soft tissue lacerations over the dorsal tips of the third, fourth and fifth fingers. No radiopaque foreign body. Electronically Signed   By: Lajean Manes M.D.   On: 04/04/2019 11:49    Review of Systems  Constitutional: Negative for weight loss.  HENT: Negative for ear discharge, ear pain, hearing loss and tinnitus.   Eyes: Negative for blurred vision, double vision, photophobia and pain.  Respiratory: Negative for cough, sputum production and shortness of breath.   Cardiovascular: Negative for chest pain.  Gastrointestinal: Negative for abdominal pain, nausea and vomiting.  Genitourinary: Negative for dysuria, flank pain, frequency and urgency.  Musculoskeletal: Positive for joint pain (Left long, ring, thumb). Negative for back pain, falls, myalgias and neck pain.  Neurological: Negative for dizziness, tingling, sensory change, focal weakness, loss of consciousness and headaches.  Endo/Heme/Allergies: Does not bruise/bleed easily.  Psychiatric/Behavioral: Negative for depression, memory loss and substance abuse.  The patient is not nervous/anxious.    Blood pressure (!) 145/97, pulse 94, temperature 98.2 F (36.8 C), temperature source Oral, resp. rate 16, height 5\' 9"  (1.753 m), weight 122.5 kg, SpO2 95 %. Physical Exam  Constitutional: He appears well-developed and well-nourished. No distress.  HENT:  Head: Normocephalic and atraumatic.  Eyes: Conjunctivae are normal. Right eye exhibits no discharge. Left  eye exhibits no discharge. No scleral icterus.  Neck: Normal range of motion.  Cardiovascular: Normal rate and regular rhythm.  Respiratory: Effort normal. No respiratory distress.  Musculoskeletal:     Comments: UEx shoulder, elbow, wrist, digits- Lacerations through nail/nail beds of long and ring fingers, numb ulnar tip of long finger distal to laceration, shallow lac over pad of thumb, mild TTP, no instability, no blocks to motion  Sens  Ax/R/M/U intact  Mot   Ax/ R/ PIN/ M/ AIN/ U intact  Rad 2+  Neurological: He is alert.  Skin: Skin is warm and dry. He is not diaphoretic.  Psychiatric: He has a normal mood and affect. His behavior is normal.            Assessment/Plan: Left long/ring/thumb lacerations -- To OR for nail removal, nail bed repair, and skin repair as necessary by Dr. Fredna Dow. Anticipate discharge after surgery.    Lisette Abu, PA-C Orthopedic Surgery [phone] 04/04/2019, 12:27 PM

## 2019-04-05 ENCOUNTER — Encounter (HOSPITAL_COMMUNITY): Payer: Self-pay | Admitting: Orthopedic Surgery

## 2019-04-16 ENCOUNTER — Telehealth: Payer: Self-pay | Admitting: General Surgery

## 2019-04-16 NOTE — Telephone Encounter (Signed)
Returned patients called he stated he had some questions about the MRI that was completed and that he would rather speak with the doctor he was informed that the doctor is not in the office and that I could help him, again he stated that he would rather speak with Dr.Byrnett I informed him that a message will be sent to the doctor.

## 2019-04-16 NOTE — Telephone Encounter (Signed)
Patient is calling said Dr. Bary Castilla left a message about some information and now the patient has some questions because he is hurting. Please call patient and advise.

## 2019-04-18 NOTE — Telephone Encounter (Signed)
Contacted the patient regarding yesterday's phone call. He has sporadic pain near the abdominal bulge, not related to position or activity. No change in bowel/ bladder function. Diet without compromise. Reviewed CT findings.  Area of recently repaired hernia with intra abdominal mesh shows some "sail" effect without a fascial defect. No intestine immediately adjacent to the abdominal wall. No dilated intestine. Sent him pictures of his 2008 and 2020 scans showing a marked change in abdominal contour, new hiatal hernia and increased intraabdominal fat corresponding to his 45# weight gain during the interval. No indication for additional imaging at this time. He will keep me posted regarding the episodic pain and he will be re-assessed if it changes in character or intensity.

## 2019-04-30 ENCOUNTER — Other Ambulatory Visit: Payer: Self-pay | Admitting: Family Medicine

## 2019-04-30 DIAGNOSIS — F419 Anxiety disorder, unspecified: Secondary | ICD-10-CM

## 2019-05-07 ENCOUNTER — Other Ambulatory Visit: Payer: Self-pay

## 2019-05-07 ENCOUNTER — Encounter: Payer: Self-pay | Admitting: General Surgery

## 2019-05-07 ENCOUNTER — Ambulatory Visit (INDEPENDENT_AMBULATORY_CARE_PROVIDER_SITE_OTHER): Payer: BLUE CROSS/BLUE SHIELD | Admitting: General Surgery

## 2019-05-07 VITALS — BP 132/88 | HR 91 | Temp 97.5°F | Resp 15 | Ht 71.0 in | Wt 269.0 lb

## 2019-05-07 DIAGNOSIS — M6208 Separation of muscle (nontraumatic), other site: Secondary | ICD-10-CM | POA: Diagnosis not present

## 2019-05-07 NOTE — Progress Notes (Signed)
[name], male   DOB: Nov 28, 1966, 53 y.o.   MRN: [id number]  Chief Complaint  Patient presents with  . Follow-up    HPI Johnny Rios is a 53 y.o. male here today for his follow up ventral hernia. Patient had a ct scan done.  HPI  Past Medical History:  Diagnosis Date  . Anxiety   . Arthritis   . Cancer [facility])    SKIN CANCER  . GERD (gastroesophageal reflux disease)   . Sleep apnea    Uses CPAP  . Tachycardia     Past Surgical History:  Procedure Laterality Date  . APPENDECTOMY    . CHOLECYSTECTOMY N/A 01/19/2017   Procedure: LAPAROSCOPIC CHOLECYSTECTOMY WITH INTRAOPERATIVE CHOLANGIOGRAM;  Surgeon: Robert Bellow, MD;  Location: [facility] ORS;  Service: General;  Laterality: N/A;  . LACERATION REPAIR Left 04/04/2019   Procedure: Repair Multiple Lacerations;  Surgeon: Leanora Cover, MD;  Location: [place];  Service: Orthopedics;  Laterality: Left;  . MINOR NAILBED REPAIR Left 04/04/2019   Procedure: Minor Nailbed Repair;  Surgeon: Leanora Cover, MD;  Location: [place];  Service: Orthopedics;  Laterality: Left;  . NERVE AND TENDON REPAIR Left 04/04/2019   Procedure: IRRIGATION AND DEBRIDEMENT OF FIXATION OF OPEN FRACTURES LONG ,[place] FINGER;  Surgeon: Leanora Cover, MD;  Location: [place];  Service: Orthopedics;  Laterality: Left;  . TONSILLECTOMY    . UMBILICAL HERNIA REPAIR N/A 06/12/2017   Ventral hernia with 6 cm Ventralex ST mesh.  . VENTRAL HERNIA REPAIR N/A 07/11/2018   15 x 20 cm intraperitoneal Ventralex mesh  ;  Surgeon: Robert Bellow, MD;  Location: [facility];  Service: General;  Laterality: N/A;    Family History  Problem Relation Age of Onset  . Cancer Father        lung  . Alzheimer's disease Mother   . Heart attack Brother   . Heart attack Brother     Social History Social History   Tobacco Use  . Smoking status: Never Smoker  . Smokeless tobacco: Former Systems developer    Types: Chew  Substance Use Topics  . Alcohol use: Yes    Comment: occasional  .  Drug use: No    No Known Allergies  Current Outpatient Medications  Medication Sig Dispense Refill  . clonazePAM (KLONOPIN) 1 MG tablet TAKE 1 TABLET (1 MG TOTAL) BY MOUTH 2 TIMES DAILY AS NEEDED FOR ANXIETY 45 tablet 4  . esomeprazole (NEXIUM) 20 MG capsule Take 1 capsule  by mouth every morning. (Patient taking differently: Take 20 mg by mouth daily. ) 90 capsule 0  . HYDROcodone-acetaminophen (NORCO) 5-325 MG tablet 1-2 tabs po q6 hours prn pain 20 tablet 0  . sulfamethoxazole-trimethoprim (BACTRIM DS) 800-160 MG tablet Take 1 tablet by mouth 2 (two) times daily. 14 tablet 0  . FLUoxetine (PROZAC) 20 MG capsule      No current facility-administered medications for this visit.     Review of Systems Review of Systems  Blood pressure 132/88, pulse 91, temperature (!) 97.5 F (36.4 C), temperature source Skin, resp. rate 15, height 5\' 11"  (1.803 m), weight 269 lb (122 kg), SpO2 97 %.  Physical Exam Physical Exam Abdominal:       Data Reviewed Results of CT reviewed with the patient, 2008 and 2020 images reviewed.   Assessment No clear evidence of fascial dehiscence.   Plan The importance of weight loss reviewed.  Less desirable option would be component separation repair. With new development  of hiatal hernia on imaging, weight loss most important.  Patient to report if any change in size or if he develops pain at the site.     [name] Trenace Coughlin 05/08/2019, 7:21 PM

## 2019-07-01 ENCOUNTER — Other Ambulatory Visit: Payer: Self-pay | Admitting: Family Medicine

## 2019-07-01 NOTE — Telephone Encounter (Signed)
Requested Prescriptions  Pending Prescriptions Disp Refills  . esomeprazole (NEXIUM) 20 MG capsule [Pharmacy Med Name: ESOMEPRAZOLE MAGNESIUM 20 MG CAP] 90 capsule 0    Sig: TAKE 1 CAPSULE BY MOUTH EVERY MORNING     Gastroenterology: Proton Pump Inhibitors Passed - 07/01/2019 10:59 AM      Passed - Valid encounter within last 12 months    Recent Outpatient Visits          6 months ago Lip lesion   [facility] Volney American, [place]   8 months ago Anxiety   [facility] [place], Jeannette How, [place]   1 year ago Mixed hyperlipidemia   [facility] Crissman, Jeannette How, MD   1 year ago Anxiety   [facility] Crissman, Jeannette How, MD   1 year ago Annual physical exam   [facility] Guadalupe Maple, MD

## 2019-09-30 ENCOUNTER — Other Ambulatory Visit: Payer: Self-pay | Admitting: Family Medicine

## 2019-09-30 DIAGNOSIS — F419 Anxiety disorder, unspecified: Secondary | ICD-10-CM

## 2019-09-30 NOTE — Telephone Encounter (Signed)
Patient has not been seen in almost a year.

## 2019-11-29 ENCOUNTER — Other Ambulatory Visit: Payer: Self-pay

## 2019-11-29 DIAGNOSIS — Z20822 Contact with and (suspected) exposure to covid-19: Secondary | ICD-10-CM

## 2019-11-30 LAB — NOVEL CORONAVIRUS, NAA: SARS-CoV-2, NAA: NOT DETECTED

## 2019-12-02 ENCOUNTER — Telehealth: Payer: Self-pay

## 2019-12-02 NOTE — Telephone Encounter (Signed)
Caller given negative result and verbalized understanding  

## 2020-02-27 ENCOUNTER — Other Ambulatory Visit: Payer: Self-pay

## 2020-02-27 ENCOUNTER — Encounter: Payer: Self-pay | Admitting: Family Medicine

## 2020-02-27 ENCOUNTER — Ambulatory Visit (INDEPENDENT_AMBULATORY_CARE_PROVIDER_SITE_OTHER): Payer: 59 | Admitting: Family Medicine

## 2020-02-27 VITALS — BP 132/88 | HR 100 | Temp 98.7°F | Ht 71.0 in | Wt 268.0 lb

## 2020-02-27 DIAGNOSIS — Z1211 Encounter for screening for malignant neoplasm of colon: Secondary | ICD-10-CM | POA: Diagnosis not present

## 2020-02-27 DIAGNOSIS — F419 Anxiety disorder, unspecified: Secondary | ICD-10-CM

## 2020-02-27 DIAGNOSIS — K279 Peptic ulcer, site unspecified, unspecified as acute or chronic, without hemorrhage or perforation: Secondary | ICD-10-CM

## 2020-02-27 MED ORDER — ESOMEPRAZOLE MAGNESIUM 20 MG PO CPDR: 20.0000 mg | DELAYED_RELEASE_CAPSULE | Freq: Every morning | ORAL | 1 refills | Status: DC

## 2020-02-27 MED ORDER — CLONAZEPAM 1 MG PO TABS: ORAL_TABLET | ORAL | 2 refills | Status: DC

## 2020-02-27 MED ORDER — ARIPIPRAZOLE 2 MG PO TABS: 2.0000 mg | ORAL_TABLET | Freq: Every day | ORAL | 0 refills | Status: DC

## 2020-02-27 NOTE — Progress Notes (Signed)
BP 132/88   Pulse 100   Temp 98.7 F (37.1 C) (Oral)   Ht 5\' 11"  (1.803 m)   Wt 268 lb (121.6 kg)   SpO2 96%   BMI 37.38 kg/m    Subjective:    [name], male    DOB: 1966/08/07, 54 y.o.   MRN: [id number]  HPI: Johnny Rios is a 54 y.o. male  Chief Complaint  Patient presents with  . Anxiety  . Gastroesophageal Reflux  . Medication Refill   Presenting today for anxiety f/u after being lost to f/u since 09/2018. Has been on 1-2 klonopin daily regimen for many years now, which he states is the only thing that works for him. Tolerates regimen well, no side effects. Always takes one in the morning, and takes a tab in the evening if becoming agitated which can be frequently. At last OV over a year ago, was started on prozac due to increased sxs. Didn't notice benefit so stopped it after a couple weeks. Does not wish to try anything else as the klonopin works so well for him. Denies SI/HI.   Taking nexium for reflux with good control and no breakthrough sxs. Denies abdominal pain, N/V, melena.   Depression screen [facility] 2/9 02/27/2020 10/22/2018 10/22/2018  Decreased Interest 0 0 0  Down, Depressed, Hopeless 0 1 -  PHQ - 2 Score 0 1 0  Altered sleeping 1 0 -  Tired, decreased energy 1 0 -  Change in appetite 0 1 -  Feeling bad or failure about yourself  0 3 -  Trouble concentrating 0 0 -  Moving slowly or fidgety/restless 0 0 -  Suicidal thoughts 0 0 -  PHQ-9 Score 2 5 -   GAD 7 : Generalized Anxiety Score 02/27/2020 10/22/2018 07/26/2017  Nervous, Anxious, on Edge 0 2 0  Control/stop worrying 0 1 1  Worry too much - different things 0 0 0  Trouble relaxing 0 0 0  Restless 0 0 0  Easily annoyed or irritable 0 2 1  Afraid - awful might happen 0 0 0  Total GAD 7 Score 0 5 2  Anxiety Difficulty - Somewhat difficult Not difficult at all   Relevant past medical, surgical, family and social history reviewed and updated as indicated. Interim medical history  since our last visit reviewed. Allergies and medications reviewed and updated.  Review of Systems  Per HPI unless specifically indicated above     Objective:    BP 132/88   Pulse 100   Temp 98.7 F (37.1 C) (Oral)   Ht 5\' 11"  (1.803 m)   Wt 268 lb (121.6 kg)   SpO2 96%   BMI 37.38 kg/m   Wt Readings from Last 3 Encounters:  02/27/20 268 lb (121.6 kg)  05/07/19 269 lb (122 kg)  04/04/19 270 lb (122.5 kg)    Physical Exam Vitals and nursing note reviewed.  Constitutional:      Appearance: Normal appearance.  HENT:     Head: Atraumatic.  Eyes:     Extraocular Movements: Extraocular movements intact.     Conjunctiva/sclera: Conjunctivae normal.  Cardiovascular:     Rate and Rhythm: Normal rate and regular rhythm.  Pulmonary:     Effort: Pulmonary effort is normal.     Breath sounds: Normal breath sounds.  Abdominal:     General: Bowel sounds are normal. There is no distension.     Palpations: Abdomen is soft.     Tenderness:  There is no abdominal tenderness. There is no guarding.  Musculoskeletal:        General: Normal range of motion.     Cervical back: Normal range of motion and neck supple.  Skin:    General: Skin is warm and dry.  Neurological:     General: No focal deficit present.     Mental Status: He is oriented to person, place, and time.  Psychiatric:        Mood and Affect: Mood normal.        Thought Content: Thought content normal.        Judgment: Judgment normal.     Results for orders placed or performed in visit on 11/29/19  Novel Coronavirus, NAA ([facility])   Specimen: Nasopharyngeal(NP) swabs in vial transport medium   NASOPHARYNGE  TESTING  Result Value Ref Range   SARS-CoV-2, NAA Not Detected Not Detected      Assessment & Plan:   Problem List Items Addressed This Visit      Digestive   PUD (peptic ulcer disease)    Stable on nexium, continue current regimen        Other   Anxiety - Primary    Lengthy discussion today  regarding risks, dependency issues with benzos and the need to be on something underlying to help control the anxiety sxs and lean on the klonopin more to treat as needed episodes. Adamant against anything similar to prozac as he states he is not depressed, but at last agreeable to trying abilify in hopes it helps with his agitation and mood lability. Will continue current klonopin regimen today without escalation as requested with hopes of tapering back slowly to one daily as needed. Recheck 1 month      Relevant Medications   clonazePAM (KLONOPIN) 1 MG tablet    Other Visit Diagnoses    Colon cancer screening       Relevant Orders   Ambulatory referral to Gastroenterology     25 minutes spent today in direct patient care and counseling  Follow up plan: Return in about 4 weeks (around 03/26/2020) for Anxiety.

## 2020-03-09 NOTE — Assessment & Plan Note (Signed)
Stable on nexium, continue current regimen

## 2020-03-09 NOTE — Assessment & Plan Note (Signed)
Lengthy discussion today regarding risks, dependency issues with benzos and the need to be on something underlying to help control the anxiety sxs and lean on the klonopin more to treat as needed episodes. Adamant against anything similar to prozac as he states he is not depressed, but at last agreeable to trying abilify in hopes it helps with his agitation and mood lability. Will continue current klonopin regimen today without escalation as requested with hopes of tapering back slowly to one daily as needed. Recheck 1 month

## 2020-03-26 ENCOUNTER — Ambulatory Visit: Payer: 59 | Admitting: Family Medicine

## 2020-03-30 ENCOUNTER — Ambulatory Visit: Payer: 59 | Admitting: Family Medicine

## 2020-04-06 ENCOUNTER — Encounter: Payer: Self-pay | Admitting: Family Medicine

## 2020-04-06 ENCOUNTER — Telehealth (INDEPENDENT_AMBULATORY_CARE_PROVIDER_SITE_OTHER): Payer: 59 | Admitting: Family Medicine

## 2020-04-06 VITALS — BP 127/98 | HR 127 | Wt 267.0 lb

## 2020-04-06 DIAGNOSIS — F419 Anxiety disorder, unspecified: Secondary | ICD-10-CM | POA: Diagnosis not present

## 2020-04-06 NOTE — Progress Notes (Signed)
BP (!) 127/98   Pulse (!) 127   Wt 267 lb (121.1 kg)   BMI 37.24 kg/m    Subjective:    [name], male    DOB: 1966/04/09, 54 y.o.   MRN: [id number]  HPI: Johnny Rios is a 54 y.o. male  Chief Complaint  Patient presents with  . Anxiety    . This visit was completed via MyChart due to the restrictions of the COVID-19 pandemic. All issues as above were discussed and addressed. Physical exam was done as above through visual confirmation on MyChart. If it was felt that the patient should be evaluated in the office, they were directed there. The patient verbally consented to this visit. . Location of the patient: home . Location of the provider: work . Those involved with this call:  . Provider: Merrie Roof, PA-C . CMA: Lesle Chris, [place] . Front Desk/Registration: Jill Side  . Time spent on call: 15 minutes with patient face to face via video conference. More than 50% of this time was spent in counseling and coordination of care. 5 minutes total spent in review of patient's record and preparation of their chart. I verified patient identity using two factors (patient name and date of birth). Patient consents verbally to being seen via telemedicine visit today.   Presenting today for anxiety f/u. Discussed at last visit starting abilify to help with his irritability and anxiety. Has tried SSRIs in the past and states he had significant side effects with them, felt very altered on them like he was drugged which was very traumatic for him. Does not wish to try anything along those lines and scared even to try abilify so has not started it. Has been well controlled with klonopin regimen for years. Typically takes once daily. Taking it twice daily 4-5 times per month when he's having particularly anxious days. Denies side effects.   Depression screen [facility] 2/9 04/06/2020 02/27/2020 10/22/2018  Decreased Interest 0 0 0  Down, Depressed, Hopeless 0 0 1  PHQ - 2 Score 0 0  1  Altered sleeping 0 1 0  Tired, decreased energy 1 1 0  Change in appetite 0 0 1  Feeling bad or failure about yourself  0 0 3  Trouble concentrating 0 0 0  Moving slowly or fidgety/restless 0 0 0  Suicidal thoughts 0 0 0  PHQ-9 Score 1 2 5    GAD 7 : Generalized Anxiety Score 04/06/2020 02/27/2020 10/22/2018 07/26/2017  Nervous, Anxious, on Edge 1 0 2 0  Control/stop worrying 0 0 1 1  Worry too much - different things 0 0 0 0  Trouble relaxing 0 0 0 0  Restless 0 0 0 0  Easily annoyed or irritable 1 0 2 1  Afraid - awful might happen 1 0 0 0  Total GAD 7 Score 3 0 5 2  Anxiety Difficulty Not difficult at all - Somewhat difficult Not difficult at all     Relevant past medical, surgical, family and social history reviewed and updated as indicated. Interim medical history since our last visit reviewed. Allergies and medications reviewed and updated.  Review of Systems  Per HPI unless specifically indicated above     Objective:    BP (!) 127/98   Pulse (!) 127   Wt 267 lb (121.1 kg)   BMI 37.24 kg/m   Wt Readings from Last 3 Encounters:  04/06/20 267 lb (121.1 kg)  02/27/20 268 lb (121.6 kg)  05/07/19 269 lb (122 kg)    Physical Exam Vitals and nursing note reviewed.  Constitutional:      General: He is not in acute distress.    Appearance: Normal appearance.  HENT:     Head: Atraumatic.     Right Ear: External ear normal.     Left Ear: External ear normal.     Nose: Nose normal. No congestion.     Mouth/Throat:     Mouth: Mucous membranes are moist.     Pharynx: Oropharynx is clear.  Eyes:     Extraocular Movements: Extraocular movements intact.     Conjunctiva/sclera: Conjunctivae normal.  Pulmonary:     Effort: Pulmonary effort is normal. No respiratory distress.  Musculoskeletal:        General: Normal range of motion.     Cervical back: Normal range of motion.  Skin:    General: Skin is dry.     Findings: No erythema or rash.  Neurological:     Mental  Status: He is oriented to person, place, and time.  Psychiatric:        Mood and Affect: Mood normal.        Thought Content: Thought content normal.        Judgment: Judgment normal.     Results for orders placed or performed in visit on 11/29/19  Novel Coronavirus, NAA ([facility])   Specimen: Nasopharyngeal(NP) swabs in vial transport medium   NASOPHARYNGE  TESTING  Result Value Ref Range   SARS-CoV-2, NAA Not Detected Not Detected      Assessment & Plan:   Problem List Items Addressed This Visit      Other   Anxiety - Primary    Strongly encouraged trying abilify, reiterated importance of a daily preventative medication for his moods/anxiety and irritability to minimize need for klonopin. Pt will consider and continue to use klonopin as infrequently as he can          Follow up plan: Return in about 3 months (around 07/06/2020) for Anxiety f/u.

## 2020-04-08 NOTE — Assessment & Plan Note (Signed)
Strongly encouraged trying abilify, reiterated importance of a daily preventative medication for his moods/anxiety and irritability to minimize need for klonopin. Pt will consider and continue to use klonopin as infrequently as he can

## 2020-04-14 ENCOUNTER — Telehealth: Payer: Self-pay | Admitting: Family Medicine

## 2020-04-14 ENCOUNTER — Encounter: Payer: Self-pay | Admitting: Family Medicine

## 2020-04-14 NOTE — Telephone Encounter (Signed)
-----   Message from Volney American, [place] sent at 04/08/2020  6:26 AM EDT ----- 3 month anxiety f/u

## 2020-04-14 NOTE — Telephone Encounter (Signed)
lvm for 3 month anxiety f/u,sent letter.

## 2020-04-15 NOTE — Telephone Encounter (Signed)
Unable to lvm to make this apt/  

## 2020-05-28 ENCOUNTER — Other Ambulatory Visit: Payer: Self-pay | Admitting: Family Medicine

## 2020-05-28 DIAGNOSIS — F419 Anxiety disorder, unspecified: Secondary | ICD-10-CM

## 2020-05-28 NOTE — Telephone Encounter (Signed)
Requested medication (s) are due for refill today - yes  Requested medication (s) are on the active medication list -yes  Future visit scheduled -yes  Last refill: 04/30/20  Notes to clinic: Request for non delegated Rx  Requested Prescriptions  Pending Prescriptions Disp Refills   clonazePAM (KLONOPIN) 1 MG tablet [Pharmacy Med Name: CLONAZEPAM 1 MG TAB] 45 tablet     Sig: TAKE 1 TABLET BY MOUTH TWICE A DAY AS NEEDED FOR ANXIETY      Not Delegated - Psychiatry:  Anxiolytics/Hypnotics Failed - 05/28/2020  4:06 PM      Failed - This refill cannot be delegated      Failed - Urine Drug Screen completed in last 360 days.      Passed - Valid encounter within last 6 months    Recent Outpatient Visits           1 month ago [place], Rachel [place], [place]   3 months ago [place], Rachel [place], [place]   1 year ago Lip lesion   [place], Lilia Argue, [place]   1 year ago [place], Jeannette How, [place]   2 years ago Mixed hyperlipidemia   [place], Jeannette How, MD       Future Appointments             In 1 week [facility] Desanctis, Lilia Argue, PA-C [place], [facility]                Requested Prescriptions  Pending Prescriptions Disp Refills   clonazePAM (KLONOPIN) 1 MG tablet [Pharmacy Med Name: CLONAZEPAM 1 MG TAB] 45 tablet     Sig: TAKE 1 TABLET BY MOUTH TWICE A DAY AS NEEDED FOR ANXIETY      Not Delegated - Psychiatry:  Anxiolytics/Hypnotics Failed - 05/28/2020  4:06 PM      Failed - This refill cannot be delegated      Failed - Urine Drug Screen completed in last 360 days.      Passed - Valid encounter within last 6 months    Recent Outpatient Visits           1 month ago [place], Rachel [place], [place]   3 months ago [place], [place], [place]   1 year ago  Lip lesion   [facility] Volney American, [place]   1 year ago [place], Jeannette How, [place]   2 years ago Mixed hyperlipidemia   [place], [place]       Future Appointments             In 1 week Orene Desanctis, Lilia Argue, [place], [place]

## 2020-05-28 NOTE — Telephone Encounter (Signed)
LOV: 02/27/2020.  Last filled 02/27/2020 for 45 tablets with 2 refills.

## 2020-06-01 ENCOUNTER — Other Ambulatory Visit: Payer: Self-pay | Admitting: Family Medicine

## 2020-06-01 DIAGNOSIS — F419 Anxiety disorder, unspecified: Secondary | ICD-10-CM

## 2020-06-01 NOTE — Telephone Encounter (Signed)
Requested medication (s) are due for refill today: no  Requested medication (s) are on the active medication list: yes  Last refill:  06/01/20  Future visit scheduled: yes  Notes to clinic:  NT cannot refuse of refill controlled substance.    Requested Prescriptions  Pending Prescriptions Disp Refills   clonazePAM (KLONOPIN) 1 MG tablet [Pharmacy Med Name: CLONAZEPAM 1 MG TAB] 45 tablet     Sig: TAKE 1 TABLET BY MOUTH TWICE A DAY AS NEEDED FOR ANXIETY      Not Delegated - Psychiatry:  Anxiolytics/Hypnotics Failed - 06/01/2020 10:02 AM      Failed - This refill cannot be delegated      Failed - Urine Drug Screen completed in last 360 days.      Passed - Valid encounter within last 6 months    Recent Outpatient Visits           1 month ago [place], Rachel [place], [place]   3 months ago [place], [place], [place]   1 year ago Lip lesion   [facility] Volney American, [place]   1 year ago [place], Jeannette How, [place]   2 years ago Mixed hyperlipidemia   [place], [place]       Future Appointments             In 3 days Orene Desanctis, Lilia Argue, [place], [place]

## 2020-06-01 NOTE — Telephone Encounter (Signed)
Request already responded to, routing to close encounter.

## 2020-06-04 ENCOUNTER — Ambulatory Visit: Payer: 59 | Admitting: Family Medicine

## 2020-06-10 ENCOUNTER — Telehealth (INDEPENDENT_AMBULATORY_CARE_PROVIDER_SITE_OTHER): Payer: 59 | Admitting: Family Medicine

## 2020-06-10 ENCOUNTER — Encounter: Payer: Self-pay | Admitting: Family Medicine

## 2020-06-10 VITALS — Wt 264.0 lb

## 2020-06-10 DIAGNOSIS — F419 Anxiety disorder, unspecified: Secondary | ICD-10-CM

## 2020-06-10 NOTE — Progress Notes (Signed)
Wt 264 lb (119.7 kg)   BMI 36.82 kg/m    Subjective:    [name], male    DOB: [birth year], 54 y.o.   MRN: [id number]  HPI: Johnny Rios is a 54 y.o. male  Chief Complaint  Patient presents with  . Anxiety    . This visit was completed via MyChart due to the restrictions of the COVID-19 pandemic. All issues as above were discussed and addressed. Physical exam was done as above through visual confirmation on MyChart. If it was felt that the patient should be evaluated in the office, they were directed there. The patient verbally consented to this visit. . Location of the patient: in parked car . Location of the provider: work . Those involved with this call:  . Provider: Merrie Roof, PA-C . CMA: Lesle Chris, [place] . Front Desk/Registration: Jill Side  . Time spent on call: 15 minutes with patient face to face via video conference. More than 50% of this time was spent in counseling and coordination of care. 5 minutes total spent in review of patient's record and preparation of their chart. I verified patient identity using two factors (patient name and date of birth). Patient consents verbally to being seen via telemedicine visit today.   Here today for anxiety f/u. Has not tried the abilify, nervous about potential side effects still. Taking the klonopin 1-2 times per day, depending on how stressed he is. States it's working well as it has for many years now. Denies side effects, mood concerns.   Depression screen [facility] 2/9 06/10/2020 04/06/2020 02/27/2020  Decreased Interest 0 0 0  Down, Depressed, Hopeless 0 0 0  PHQ - 2 Score 0 0 0  Altered sleeping 0 0 1  Tired, decreased energy 1 1 1   Change in appetite 0 0 0  Feeling bad or failure about yourself  0 0 0  Trouble concentrating 0 0 0  Moving slowly or fidgety/restless 0 0 0  Suicidal thoughts 0 0 0  PHQ-9 Score 1 1 2    GAD 7 : Generalized Anxiety Score 06/10/2020 04/06/2020 02/27/2020 10/22/2018    Nervous, Anxious, on Edge 1 1 0 2  Control/stop worrying 0 0 0 1  Worry too much - different things 0 0 0 0  Trouble relaxing 0 0 0 0  Restless 0 0 0 0  Easily annoyed or irritable 1 1 0 2  Afraid - awful might happen 0 1 0 0  Total GAD 7 Score 2 3 0 5  Anxiety Difficulty Not difficult at all Not difficult at all - Somewhat difficult   Relevant past medical, surgical, family and social history reviewed and updated as indicated. Interim medical history since our last visit reviewed. Allergies and medications reviewed and updated.  Review of Systems  Per HPI unless specifically indicated above     Objective:    Wt 264 lb (119.7 kg)   BMI 36.82 kg/m   Wt Readings from Last 3 Encounters:  06/10/20 264 lb (119.7 kg)  04/06/20 267 lb (121.1 kg)  02/27/20 268 lb (121.6 kg)    Physical Exam Vitals and nursing note reviewed.  Constitutional:      General: He is not in acute distress.    Appearance: Normal appearance.  HENT:     Head: Atraumatic.     Right Ear: External ear normal.     Left Ear: External ear normal.     Nose: Nose normal. No congestion.  Mouth/Throat:     Mouth: Mucous membranes are moist.     Pharynx: Oropharynx is clear.  Eyes:     Extraocular Movements: Extraocular movements intact.     Conjunctiva/sclera: Conjunctivae normal.  Pulmonary:     Effort: Pulmonary effort is normal. No respiratory distress.  Musculoskeletal:        General: Normal range of motion.     Cervical back: Normal range of motion.  Skin:    General: Skin is dry.     Findings: No erythema or rash.  Neurological:     Mental Status: He is oriented to person, place, and time.  Psychiatric:        Mood and Affect: Mood normal.        Thought Content: Thought content normal.        Judgment: Judgment normal.     Results for orders placed or performed in visit on 11/29/19  Novel Coronavirus, NAA ([facility])   Specimen: Nasopharyngeal(NP) swabs in vial transport medium    NASOPHARYNGE  TESTING  Result Value Ref Range   SARS-CoV-2, NAA Not Detected Not Detected      Assessment & Plan:   Problem List Items Addressed This Visit      Other   Anxiety - Primary    Stable and well controlled, encouraged trying the abilify to see if that will help reduce need for as much klonopin. Pt will consider. Continue current regimen in meantime          Follow up plan: Return in about 3 months (around 09/10/2020) for CPE.

## 2020-06-12 NOTE — Assessment & Plan Note (Addendum)
Stable and well controlled, encouraged trying the abilify to see if that will help reduce need for as much klonopin. Pt will consider. Continue current regimen in meantime

## 2020-08-26 ENCOUNTER — Other Ambulatory Visit: Payer: Self-pay | Admitting: Critical Care Medicine

## 2020-08-26 ENCOUNTER — Other Ambulatory Visit: Payer: 59

## 2020-08-26 DIAGNOSIS — Z20822 Contact with and (suspected) exposure to covid-19: Secondary | ICD-10-CM

## 2020-08-28 LAB — NOVEL CORONAVIRUS, NAA: SARS-CoV-2, NAA: DETECTED — AB

## 2020-09-10 ENCOUNTER — Encounter: Payer: 59 | Admitting: Family Medicine

## 2020-09-10 ENCOUNTER — Encounter: Payer: 59 | Admitting: Unknown Physician Specialty

## 2020-09-17 ENCOUNTER — Encounter: Payer: 59 | Admitting: Unknown Physician Specialty

## 2020-10-01 ENCOUNTER — Ambulatory Visit (INDEPENDENT_AMBULATORY_CARE_PROVIDER_SITE_OTHER): Payer: 59 | Admitting: Unknown Physician Specialty

## 2020-10-01 ENCOUNTER — Encounter: Payer: Self-pay | Admitting: Unknown Physician Specialty

## 2020-10-01 ENCOUNTER — Other Ambulatory Visit: Payer: Self-pay

## 2020-10-01 VITALS — BP 117/85 | HR 103 | Temp 98.0°F | Ht 69.0 in | Wt 256.6 lb

## 2020-10-01 DIAGNOSIS — Z0000 Encounter for general adult medical examination without abnormal findings: Secondary | ICD-10-CM

## 2020-10-01 DIAGNOSIS — G4733 Obstructive sleep apnea (adult) (pediatric): Secondary | ICD-10-CM

## 2020-10-01 DIAGNOSIS — R739 Hyperglycemia, unspecified: Secondary | ICD-10-CM

## 2020-10-01 DIAGNOSIS — Z85828 Personal history of other malignant neoplasm of skin: Secondary | ICD-10-CM

## 2020-10-01 DIAGNOSIS — Z1211 Encounter for screening for malignant neoplasm of colon: Secondary | ICD-10-CM

## 2020-10-01 DIAGNOSIS — F419 Anxiety disorder, unspecified: Secondary | ICD-10-CM

## 2020-10-01 DIAGNOSIS — Z9989 Dependence on other enabling machines and devices: Secondary | ICD-10-CM

## 2020-10-01 DIAGNOSIS — K279 Peptic ulcer, site unspecified, unspecified as acute or chronic, without hemorrhage or perforation: Secondary | ICD-10-CM

## 2020-10-01 MED ORDER — ESOMEPRAZOLE MAGNESIUM 20 MG PO CPDR
20.0000 mg | DELAYED_RELEASE_CAPSULE | Freq: Every morning | ORAL | 1 refills | Status: DC
Start: 2020-10-01 — End: 2021-02-26

## 2020-10-01 MED ORDER — CLONAZEPAM 1 MG PO TABS: ORAL_TABLET | ORAL | 2 refills | Status: DC

## 2020-10-01 NOTE — Progress Notes (Signed)
BP 117/85 (BP Location: Left Arm, Patient Position: Sitting, Cuff Size: Normal)    Pulse (!) 103    Temp 98 F (36.7 C) (Oral)    Ht 5\' 9"  (1.753 m)    Wt 256 lb 9.6 oz (116.4 kg)    SpO2 97%    BMI 37.89 kg/m    Subjective:    [name], male    DOB: [birth year], 54 y.o.   MRN: [id number]  HPI: Johnny Rios is a 54 y.o. male presents for annual exam, needs CRC screening, Hep C screening, refuses Covid and Flu vaccination.    OSA: Uses CPAP nightly. Wakes up feeling rested and not fatigued.   PUD: No N/V, diarrhea or constipation. Has bowel movements daily.   Anxiety: Taking Klonopin 1mg  daily and occasionally take second dose if feeling more anxious. States he did not try Abilify as per last visits recommendation. Still nervous about potential side effects.    IGT: Glucose levels have been elevated in past. Eats low salt, low fat diet. Drinks an occasional beer socially.   Chief Complaint  Patient presents with   Annual Exam   GAD 7 : Generalized Anxiety Score 06/10/2020 04/06/2020 02/27/2020 10/22/2018  Nervous, Anxious, on Edge 1 1 0 2  Control/stop worrying 0 0 0 1  Worry too much - different things 0 0 0 0  Trouble relaxing 0 0 0 0  Restless 0 0 0 0  Easily annoyed or irritable 1 1 0 2  Afraid - awful might happen 0 1 0 0  Total GAD 7 Score 2 3 0 5  Anxiety Difficulty Not difficult at all Not difficult at all - Somewhat difficult   Depression screen [facility] 2/9 10/01/2020 06/10/2020 04/06/2020 02/27/2020 10/22/2018  Decreased Interest 0 0 0 0 0  Down, Depressed, Hopeless 0 0 0 0 1  PHQ - 2 Score 0 0 0 0 1  Altered sleeping - 0 0 1 0  Tired, decreased energy - 1 1 1  0  Change in appetite - 0 0 0 1  Feeling bad or failure about yourself  - 0 0 0 3  Trouble concentrating - 0 0 0 0  Moving slowly or fidgety/restless - 0 0 0 0  Suicidal thoughts - 0 0 0 0  PHQ-9 Score - 1 1 2 5     Relevant past medical, surgical, family and social history reviewed and  updated as indicated. Interim medical history since our last visit reviewed. Allergies and medications reviewed and updated. Past Medical History:  Diagnosis Date   Anxiety    Arthritis    Cancer ([place])    SKIN CANCER   GERD (gastroesophageal reflux disease)    Sleep apnea    Uses CPAP   Tachycardia    Family History  Problem Relation Age of Onset   Cancer Father        lung   Alzheimer's disease Mother    Heart attack Brother    Heart attack Brother    Social History   Socioeconomic History   Marital status: Single    Spouse name: Not on file   Number of children: 2   Years of education: HS   Highest education level: Not on file  Occupational History   Not on file  Tobacco Use   Smoking status: Never Smoker   Smokeless tobacco: Former Systems developer    Types: Nurse, children's Use: Never used  Substance and Sexual  Activity   Alcohol use: Yes    Comment: occasional   Drug use: No   Sexual activity: Not on file  Other Topics Concern   Not on file  Social History Narrative   Drinks 3-4 caffeine drinks a day    Social Determinants of Health   Financial Resource Strain:    Difficulty of Paying Living Expenses: Not on file  Food Insecurity:    Worried About [place] in the Last Year: Not on file   [employer] of Food in the Last Year: Not on file  Transportation Needs:    Lack of Transportation (Medical): Not on file   Lack of Transportation (Non-Medical): Not on file  Physical Activity:    Days of Exercise per Week: Not on file   Minutes of Exercise per Session: Not on file  Stress:    Feeling of Stress : Not on file  Social Connections:    Frequency of Communication with Friends and Family: Not on file   Frequency of Social Gatherings with Friends and Family: Not on file   Attends Religious Services: Not on file   Active Member of Clubs or Organizations: Not on file   Attends Archivist Meetings: Not on  file   Marital Status: Not on file  Intimate Partner Violence:    Fear of Current or Ex-Partner: Not on file   Emotionally Abused: Not on file   Physically Abused: Not on file   Sexually Abused: Not on file     Review of Systems  All other systems reviewed and are negative.   Per HPI unless specifically indicated above     Objective:    BP 117/85 (BP Location: Left Arm, Patient Position: Sitting, Cuff Size: Normal)    Pulse (!) 103    Temp 98 F (36.7 C) (Oral)    Ht 5\' 9"  (1.753 m)    Wt 256 lb 9.6 oz (116.4 kg)    SpO2 97%    BMI 37.89 kg/m   Wt Readings from Last 3 Encounters:  10/01/20 256 lb 9.6 oz (116.4 kg)  06/10/20 264 lb (119.7 kg)  04/06/20 267 lb (121.1 kg)    Physical Exam Constitutional:      Appearance: Normal appearance.  HENT:     Head: Normocephalic and atraumatic.     Right Ear: Ear canal and external ear normal.     Left Ear: Ear canal and external ear normal.     Mouth/Throat:     Mouth: Mucous membranes are moist.  Eyes:     Extraocular Movements: Extraocular movements intact.     Conjunctiva/sclera: Conjunctivae normal.  Cardiovascular:     Rate and Rhythm: Normal rate and regular rhythm.     Pulses: Normal pulses.     Heart sounds: Normal heart sounds.  Pulmonary:     Effort: Pulmonary effort is normal.     Breath sounds: Normal breath sounds.  Abdominal:     General: Bowel sounds are normal.     Palpations: Abdomen is soft.     Hernia: A hernia (Follows with Dr. Arsenio Katz) is present.  Musculoskeletal:        General: Normal range of motion.     Cervical back: Normal range of motion.  Skin:    General: Skin is warm and dry.     Findings: Lesion (two nodules on anterior upper chest,) present.  Neurological:     Mental Status: He is alert and oriented to person,  place, and time.  Psychiatric:        Mood and Affect: Mood normal.        Behavior: Behavior normal.        Thought Content: Thought content normal.         Judgment: Judgment normal.     Results for orders placed or performed in visit on 08/26/20  Novel Coronavirus, NAA ([facility])   Specimen: Nasopharyngeal(NP) swabs in vial transport medium   Nasopharynge  Screenin  Result Value Ref Range   SARS-CoV-2, NAA Detected (A) Not Detected      Assessment & Plan:   Problem List Items Addressed This Visit      Unprioritized   Anxiety    Stable on Klonopin, taking 1-2 tablets daily. Did not try Abilify as he is concerned about side effects. Medication education done, pt. Still resistant to trying Abilify wants to continue Klonopin.        Relevant Medications   clonazePAM (KLONOPIN) 1 MG tablet   OSA on CPAP    Using CPAP nightly, doing well.       PUD (peptic ulcer disease)    Stable on Nexium, refill sent. Referral to GI for colonoscopy sent.        Other Visit Diagnoses    Routine general medical examination at a health care facility    -  Primary   Relevant Orders   CBC with Differential/Platelet   Comprehensive metabolic panel   Hepatitis C antibody   TSH   PSA   Lipid Panel w/o Chol/HDL Ratio   Hgb A1c w/o eAG   Colon cancer screening       Relevant Orders   Ambulatory referral to Gastroenterology   Hgb A1c w/o eAG   Elevated blood sugar       Relevant Orders   Hgb A1c w/o eAG       Follow up plan: Return in about 3 months (around 01/01/2021). due to Clonazepam use

## 2020-10-01 NOTE — Assessment & Plan Note (Signed)
Stable on Klonopin, taking 1-2 tablets daily. Did not try Abilify as he is concerned about side effects. Medication education done, pt. Still resistant to trying Abilify wants to continue Klonopin.

## 2020-10-01 NOTE — Assessment & Plan Note (Addendum)
Stable on Nexium, refill sent. Referral to GI for colonoscopy sent.

## 2020-10-01 NOTE — Assessment & Plan Note (Signed)
Using CPAP nightly, doing well.

## 2020-10-02 ENCOUNTER — Other Ambulatory Visit: Payer: Self-pay | Admitting: Unknown Physician Specialty

## 2020-10-02 DIAGNOSIS — R972 Elevated prostate specific antigen [PSA]: Secondary | ICD-10-CM

## 2020-10-02 LAB — CBC WITH DIFFERENTIAL/PLATELET
Basophils Absolute: 0.1 10*3/uL (ref 0.0–0.2)
Basos: 1 %
EOS (ABSOLUTE): 0.2 10*3/uL (ref 0.0–0.4)
Eos: 2 %
Hematocrit: 40.2 % (ref 37.5–51.0)
Hemoglobin: 13.4 g/dL (ref 13.0–17.7)
Immature Grans (Abs): 0 10*3/uL (ref 0.0–0.1)
Immature Granulocytes: 0 %
Lymphocytes Absolute: 2.4 10*3/uL (ref 0.7–3.1)
Lymphs: 34 %
MCH: 26.7 pg (ref 26.6–33.0)
MCHC: 33.3 g/dL (ref 31.5–35.7)
MCV: 80 fL (ref 79–97)
Monocytes Absolute: 0.6 10*3/uL (ref 0.1–0.9)
Monocytes: 8 %
Neutrophils Absolute: 4 10*3/uL (ref 1.4–7.0)
Neutrophils: 55 %
Platelets: 310 10*3/uL (ref 150–450)
RBC: 5.02 x10E6/uL (ref 4.14–5.80)
RDW: 13.7 % (ref 11.6–15.4)
WBC: 7.2 10*3/uL (ref 3.4–10.8)

## 2020-10-02 LAB — COMPREHENSIVE METABOLIC PANEL
ALT: 34 IU/L (ref 0–44)
AST: 20 IU/L (ref 0–40)
Albumin/Globulin Ratio: 1.6 (ref 1.2–2.2)
Albumin: 4.4 g/dL (ref 3.8–4.9)
Alkaline Phosphatase: 107 IU/L (ref 44–121)
BUN/Creatinine Ratio: 12 (ref 9–20)
BUN: 11 mg/dL (ref 6–24)
Bilirubin Total: 0.4 mg/dL (ref 0.0–1.2)
CO2: 23 mmol/L (ref 20–29)
Calcium: 9.4 mg/dL (ref 8.7–10.2)
Chloride: 102 mmol/L (ref 96–106)
Creatinine, Ser: 0.95 mg/dL (ref 0.76–1.27)
GFR calc Af Amer: 106 mL/min/{1.73_m2} (ref >59)
GFR calc non Af Amer: 92 mL/min/{1.73_m2} (ref >59)
Globulin, Total: 2.7 g/dL (ref 1.5–4.5)
Glucose: 103 mg/dL — ABNORMAL HIGH (ref 65–99)
Potassium: 4.2 mmol/L (ref 3.5–5.2)
Sodium: 138 mmol/L (ref 134–144)
Total Protein: 7.1 g/dL (ref 6.0–8.5)

## 2020-10-02 LAB — LIPID PANEL W/O CHOL/HDL RATIO
Cholesterol, Total: 234 mg/dL — ABNORMAL HIGH (ref 100–199)
HDL: 27 mg/dL — ABNORMAL LOW (ref >39)
LDL Chol Calc (NIH): 152 mg/dL — ABNORMAL HIGH (ref 0–99)
Triglycerides: 293 mg/dL — ABNORMAL HIGH (ref 0–149)
VLDL Cholesterol Cal: 55 mg/dL — ABNORMAL HIGH (ref 5–40)

## 2020-10-02 LAB — HEPATITIS C ANTIBODY: Hep C Virus Ab: 0.1 s/co ratio (ref 0.0–0.9)

## 2020-10-02 LAB — TSH: TSH: 1.13 u[IU]/mL (ref 0.450–4.500)

## 2020-10-02 LAB — PSA: Prostate Specific Ag, Serum: 3.6 ng/mL (ref 0.0–4.0)

## 2021-01-02 ENCOUNTER — Encounter: Payer: Self-pay | Admitting: Nurse Practitioner

## 2021-01-02 DIAGNOSIS — E7800 Pure hypercholesterolemia, unspecified: Secondary | ICD-10-CM | POA: Insufficient documentation

## 2021-01-04 ENCOUNTER — Ambulatory Visit: Payer: Self-pay | Admitting: Nurse Practitioner

## 2021-01-27 ENCOUNTER — Telehealth: Payer: Self-pay | Admitting: Unknown Physician Specialty

## 2021-01-27 NOTE — Telephone Encounter (Signed)
PT calling stating the most prescription sent to pharmacy for clonazePAM (KLONOPIN) 1 MG tablet had different instructions than his normal prescription. He states he usually takes 1-2 as needed while his prescription states 1 per day. PT states he is running low and is requesting to speak with PCP, please advise.   [facility] [place] [place] [facility] [address]  [place], [place] [address]  Phone:  [phone] Fax:  [phone]

## 2021-01-27 NOTE — Telephone Encounter (Signed)
Routing to provider  

## 2021-01-28 NOTE — Telephone Encounter (Signed)
Patient should make an appointment to come in and discuss this.  Patient is overdue for follow up.

## 2021-01-28 NOTE — Telephone Encounter (Signed)
Pt is scheduled Monday with Dr Ky Barban

## 2021-02-01 ENCOUNTER — Ambulatory Visit: Payer: Self-pay | Admitting: Family Medicine

## 2021-02-03 ENCOUNTER — Encounter: Payer: Self-pay | Admitting: Family Medicine

## 2021-02-03 ENCOUNTER — Other Ambulatory Visit: Payer: Self-pay

## 2021-02-03 ENCOUNTER — Ambulatory Visit (INDEPENDENT_AMBULATORY_CARE_PROVIDER_SITE_OTHER): Payer: Self-pay | Admitting: Family Medicine

## 2021-02-03 VITALS — BP 142/101 | HR 93 | Temp 98.3°F | Wt 270.0 lb

## 2021-02-03 DIAGNOSIS — F419 Anxiety disorder, unspecified: Secondary | ICD-10-CM

## 2021-02-03 DIAGNOSIS — Z1211 Encounter for screening for malignant neoplasm of colon: Secondary | ICD-10-CM

## 2021-02-03 MED ORDER — SERTRALINE HCL 50 MG PO TABS: 50.0000 mg | ORAL_TABLET | Freq: Every day | ORAL | 0 refills | Status: DC

## 2021-02-03 NOTE — Assessment & Plan Note (Signed)
GAD 4 today although taking more klonopin than prescribed. Lengthy discussion had today about risks of chronic benzo use, especially with aging and h/o OSA. Given long term use and symptoms, suspect dependency. Informed patient due to harms and long term use, we will be tapering down. Hesitant though amenable to starting SSRI for baseline control today, Rx sent for starting dose. Advised patient this will likely require titration over time. He is to use coping mechanisms prior to taking klonopin, advised I would not be filling this early, would be due for refill 2/18.  Offered psych referral and counseling, declined. F/u in 2 weeks.

## 2021-02-03 NOTE — Patient Instructions (Signed)
It was great to see you!  Our plans for today: - Take the sertraline daily as prescribed. - Make your klonopin last until 2/18. Try cutting pills in half when you need it.  - Follow up in 2 weeks.   Take care and seek immediate care sooner if you develop any concerns.   Dr. Ky Barban

## 2021-02-03 NOTE — Progress Notes (Signed)
   SUBJECTIVE:   CHIEF COMPLAINT / HPI:   Patient Active Problem List   Diagnosis Date Noted  . Elevated low density lipoprotein (LDL) cholesterol level 01/02/2021  . Recurrent ventral hernia 07/05/2018  . Rectus diastasis 07/05/2018  . DDD (degenerative disc disease), lumbar 04/16/2018  . Low testosterone 10/09/2017  . Elevated glucose 09/26/2017  . Mixed hyperlipidemia 09/26/2017  . Chronic fatigue 07/26/2017  . PUD (peptic ulcer disease) 10/31/2016  . Obesity 12/02/2015  . OSA on CPAP 12/02/2015  . Anxiety    Anxiety - Medications: klonopin 1mg  daily prn - previously on Abilify, did not tolerate, more irritable. Tried other meds in the past, doesn't remember names.  - Taking: 2 pills once or twice per week. At least one pill daily. - Counseling: no - Previous hospitalizations: no - FH of psych illness: sister with anxiety - Symptoms: "feeling weird, tingling" a little jittery or nervous - denies CP, Sob, vision changes, chest tightness - Current stressors: none identified - Coping Mechanisms: deep breathing.   GAD 7 : Generalized Anxiety Score 02/03/2021 06/10/2020 04/06/2020 02/27/2020  Nervous, Anxious, on Edge 1 1 1  0  Control/stop worrying 0 0 0 0  Worry too much - different things 0 0 0 0  Trouble relaxing 0 0 0 0  Restless 0 0 0 0  Easily annoyed or irritable 2 1 1  0  Afraid - awful might happen 1 0 1 0  Total GAD 7 Score 4 2 3  0  Anxiety Difficulty Not difficult at all Not difficult at all Not difficult at all -     OBJECTIVE:   BP (!) 142/101   Pulse 93   Temp 98.3 F (36.8 C)   Wt 270 lb (122.5 kg)   SpO2 97%   BMI 39.87 kg/m   Gen: well appearing, in NAD Psych: mood and affect euthymic, speech nonpressured. No tangential thought process.   ASSESSMENT/PLAN:   Anxiety GAD 4 today although taking more klonopin than prescribed. Lengthy discussion had today about risks of chronic benzo use, especially with aging and h/o OSA. Given long term use and  symptoms, suspect dependency. Informed patient due to harms and long term use, we will be tapering down. Hesitant though amenable to starting SSRI for baseline control today, Rx sent for starting dose. Advised patient this will likely require titration over time. He is to use coping mechanisms prior to taking klonopin, advised I would not be filling this early, would be due for refill 2/18.  Offered psych referral and counseling, declined. F/u in 2 weeks.     Myles Gip, DO

## 2021-02-12 ENCOUNTER — Other Ambulatory Visit: Payer: Self-pay | Admitting: Unknown Physician Specialty

## 2021-02-12 DIAGNOSIS — F419 Anxiety disorder, unspecified: Secondary | ICD-10-CM

## 2021-02-12 NOTE — Telephone Encounter (Signed)
Pt called again about status of Clonazepam refill/ I advised pt that it was sent to provider for review and of the refill timeframe/ pt stated he was out and if someone can give him a call when this has been sent to the pharmacy / please advise

## 2021-02-12 NOTE — Telephone Encounter (Signed)
PT called in wanting an update on this prescription, stated his pharmacy did not have anything yet. PT states he is out of this medication.

## 2021-02-12 NOTE — Telephone Encounter (Signed)
Called pt advised rx has been sent pt verbalized understanding 

## 2021-02-12 NOTE — Telephone Encounter (Signed)
PDMP review last fill 12/30/20

## 2021-02-12 NOTE — Telephone Encounter (Signed)
Patient is calling again regarding his prescription. He states he really needs it today.  Please advise.

## 2021-02-12 NOTE — Telephone Encounter (Signed)
Requested medication (s) are due for refill today: yes  Requested medication (s) are on the active medication list: yes  Last refill:  12/30/2020  Future visit scheduled: yes  Notes to clinic:  this refill cannot be delegated    Requested Prescriptions  Pending Prescriptions Disp Refills   clonazePAM (KLONOPIN) 1 MG tablet [Pharmacy Med Name: CLONAZEPAM 1 MG TAB] 45 tablet     Sig: TAKE 1 TABLET BY MOUTH DAILY AS NEEDED      Not Delegated - Psychiatry:  Anxiolytics/Hypnotics Failed - 02/12/2021  9:26 AM      Failed - This refill cannot be delegated      Failed - Urine Drug Screen completed in last 360 days      Passed - Valid encounter within last 6 months    Recent Outpatient Visits           1 week ago Screening for colon cancer   [facility] Myles Gip, DO   4 months ago Routine general medical examination at a health care facility   [place], NP   8 months ago [place], [place], [place]   10 months ago [place], [place], [place]   11 months ago [place], Lilia Argue, [place]       Future Appointments             In 3 days Ralene Bathe, MD [place]   In 5 days [place], Barbaraann Faster, NP [facility], [place]

## 2021-02-15 ENCOUNTER — Ambulatory Visit: Payer: 59 | Admitting: Dermatology

## 2021-02-17 ENCOUNTER — Other Ambulatory Visit: Payer: Self-pay

## 2021-02-17 ENCOUNTER — Ambulatory Visit (INDEPENDENT_AMBULATORY_CARE_PROVIDER_SITE_OTHER): Payer: 59 | Admitting: Nurse Practitioner

## 2021-02-17 ENCOUNTER — Encounter: Payer: Self-pay | Admitting: Nurse Practitioner

## 2021-02-17 VITALS — BP 126/88 | HR 93 | Temp 98.1°F | Wt 276.8 lb

## 2021-02-17 DIAGNOSIS — Z79899 Other long term (current) drug therapy: Secondary | ICD-10-CM | POA: Diagnosis not present

## 2021-02-17 DIAGNOSIS — F419 Anxiety disorder, unspecified: Secondary | ICD-10-CM

## 2021-02-17 MED ORDER — CLONAZEPAM 1 MG PO TABS: ORAL_TABLET | ORAL | 2 refills | Status: DC

## 2021-02-17 NOTE — Patient Instructions (Signed)

## 2021-02-17 NOTE — Assessment & Plan Note (Signed)
Refer to anxiety plan of care -- UDS today and obtain controlled subs contract next visit when meets new PCP.

## 2021-02-17 NOTE — Assessment & Plan Note (Signed)
Chronic, ongoing with long term benzo use.  Has tried multiple medications in past and refuses to try again.  Refuses psychiatry referral.  At length discussion with patient on risks of long term benzo use, he is fully aware and wishes to continue current treatment.  Is agreeable to yearly UDS and controlled subs contract.  Refills sent on Klonopin date for 03/12/21 #30 with 2 refills.  UDS obtained today and obtain contract next visit.  Return in 3 months.

## 2021-02-17 NOTE — Progress Notes (Signed)
BP 126/88   Pulse 93   Temp 98.1 F (36.7 C) (Oral)   Wt 276 lb 12.8 oz (125.6 kg)   SpO2 98%   BMI 40.88 kg/m    Subjective:    [name], male    DOB: 05/20/1966, 55 y.o.   MRN: [id number]  HPI: Johnny Rios is a 54 y.o. male  Chief Complaint  Patient presents with  . Follow-up    Pt states he would discuss his medications.    ANXIETY/STRESS Was started on SSRI last visit due to increased use of benzo at home and increased anxiety.  Psych referral was offered last visit by provider seen and he refused.  Started on Sertraline 50 MG last visit, reports he is not taking -- has tried this before and reports it does not work.  He is taking Klonopin 1 MG, is taking daily.  Has been taking this a long time and reports his previous PCP supplied him 45 pills monthly, in case he needed extra -- discussed with him that current fills would be for #30.  Pt is aware of risks of benzo medication use to include increased sedation, respiratory suppression, falls, dementia, dependence, and cardiovascular events.  Pt would like to continue treatment as benefit determined to outweigh risk.  Last fill on PDMP review was 02/12/21.  He reports he has tried multiple other daily medications, SSRI and SNRI -- which have offered no benefit.  Never has gone to psychiatry.  Duration:stable Anxious mood: yes  Excessive worrying: no Irritability: no  Sweating: no Nausea: no Palpitations:no Hyperventilation: no Panic attacks: occasional Agoraphobia: no  Obscessions/compulsions: no Depressed mood: no Depression screen [facility] 2/9 02/03/2021 10/01/2020 06/10/2020 04/06/2020 02/27/2020  Decreased Interest 0 0 0 0 0  Down, Depressed, Hopeless 0 0 0 0 0  PHQ - 2 Score 0 0 0 0 0  Altered sleeping 0 - 0 0 1  Tired, decreased energy 3 - 1 1 1   Change in appetite 2 - 0 0 0  Feeling bad or failure about yourself  0 - 0 0 0  Trouble concentrating 0 - 0 0 0  Moving slowly or fidgety/restless 0 - 0 0  0  Suicidal thoughts 0 - 0 0 0  PHQ-9 Score 5 - 1 1 2   Difficult doing work/chores Not difficult at all - - - -   Anhedonia: no Weight changes: no Insomnia: yes hard to stay asleep  Hypersomnia: no Fatigue/loss of energy: no Feelings of worthlessness: no Feelings of guilt: no Impaired concentration/indecisiveness: no Suicidal ideations: no  Crying spells: no Recent Stressors/Life Changes: no   Relationship problems: no   Family stress: no     Financial stress: no    Job stress: no    Recent death/loss: no GAD 7 : Generalized Anxiety Score 02/03/2021 06/10/2020 04/06/2020 02/27/2020  Nervous, Anxious, on Edge 1 1 1  0  Control/stop worrying 0 0 0 0  Worry too much - different things 0 0 0 0  Trouble relaxing 0 0 0 0  Restless 0 0 0 0  Easily annoyed or irritable 2 1 1  0  Afraid - awful might happen 1 0 1 0  Total GAD 7 Score 4 2 3  0  Anxiety Difficulty Not difficult at all Not difficult at all Not difficult at all -   Relevant past medical, surgical, family and social history reviewed and updated as indicated. Interim medical history since our last visit reviewed. Allergies and medications  reviewed and updated.  Review of Systems  Constitutional: Negative.   Respiratory: Negative.   Cardiovascular: Negative.   Gastrointestinal: Negative.   Neurological: Negative.   Psychiatric/Behavioral: Positive for sleep disturbance. Negative for decreased concentration, self-injury and suicidal ideas. The patient is nervous/anxious.     Per HPI unless specifically indicated above     Objective:    BP 126/88   Pulse 93   Temp 98.1 F (36.7 C) (Oral)   Wt 276 lb 12.8 oz (125.6 kg)   SpO2 98%   BMI 40.88 kg/m   Wt Readings from Last 3 Encounters:  02/17/21 276 lb 12.8 oz (125.6 kg)  02/03/21 270 lb (122.5 kg)  10/01/20 256 lb 9.6 oz (116.4 kg)    Physical Exam Vitals and nursing note reviewed.  Constitutional:      General: He is awake. He is not in acute distress.     Appearance: He is well-developed and well-groomed. He is obese. He is not ill-appearing.  HENT:     Head: Normocephalic and atraumatic.     Right Ear: Hearing normal. No drainage.     Left Ear: Hearing normal. No drainage.  Eyes:     General: Lids are normal.        Right eye: No discharge.        Left eye: No discharge.     Conjunctiva/sclera: Conjunctivae normal.     Pupils: Pupils are equal, round, and reactive to light.  Neck:     Trachea: Trachea normal.  Cardiovascular:     Rate and Rhythm: Normal rate and regular rhythm.     Heart sounds: Normal heart sounds, S1 normal and S2 normal. No murmur heard. No gallop.   Pulmonary:     Effort: Pulmonary effort is normal. No accessory muscle usage or respiratory distress.     Breath sounds: Normal breath sounds.  Abdominal:     General: Bowel sounds are normal.     Palpations: Abdomen is soft.  Musculoskeletal:        General: Normal range of motion.     Cervical back: Normal range of motion and neck supple.     Right lower leg: No edema.     Left lower leg: No edema.  Skin:    General: Skin is warm and dry.     Capillary Refill: Capillary refill takes less than 2 seconds.  Neurological:     Mental Status: He is alert and oriented to person, place, and time.  Psychiatric:        Attention and Perception: Attention normal.        Mood and Affect: Mood normal.        Speech: Speech normal.        Behavior: Behavior normal. Behavior is cooperative.        Thought Content: Thought content normal.     Results for orders placed or performed in visit on 10/01/20  CBC with Differential/Platelet  Result Value Ref Range   WBC 7.2 3.4 - 10.8 x10E3/uL   RBC 5.02 4.14 - 5.80 x10E6/uL   Hemoglobin 13.4 13.0 - 17.7 g/dL   Hematocrit 40.2 37.5 - 51.0 %   MCV 80 79 - 97 fL   MCH 26.7 26.6 - 33.0 pg   MCHC 33.3 31.5 - 35.7 g/dL   RDW 13.7 11.6 - 15.4 %   Platelets 310 150 - 450 x10E3/uL   Neutrophils 55 Not Estab. %   Lymphs 34 Not  Estab. %  Monocytes 8 Not Estab. %   Eos 2 Not Estab. %   Basos 1 Not Estab. %   Neutrophils Absolute 4.0 1.4 - 7.0 x10E3/uL   Lymphocytes Absolute 2.4 0.7 - 3.1 x10E3/uL   Monocytes Absolute 0.6 0.1 - 0.9 x10E3/uL   EOS (ABSOLUTE) 0.2 0.0 - 0.4 x10E3/uL   Basophils Absolute 0.1 0.0 - 0.2 x10E3/uL   Immature Granulocytes 0 Not Estab. %   Immature Grans (Abs) 0.0 0.0 - 0.1 x10E3/uL  Comprehensive metabolic panel  Result Value Ref Range   Glucose 103 (H) 65 - 99 mg/dL   BUN 11 6 - 24 mg/dL   Creatinine, Ser 0.95 0.76 - 1.27 mg/dL   GFR calc non Af Amer 92 >59 mL/min/1.73   GFR calc Af Amer 106 >59 mL/min/1.73   BUN/Creatinine Ratio 12 9 - 20   Sodium 138 134 - 144 mmol/L   Potassium 4.2 3.5 - 5.2 mmol/L   Chloride 102 96 - 106 mmol/L   CO2 23 20 - 29 mmol/L   Calcium 9.4 8.7 - 10.2 mg/dL   Total Protein 7.1 6.0 - 8.5 g/dL   Albumin 4.4 3.8 - 4.9 g/dL   Globulin, Total 2.7 1.5 - 4.5 g/dL   Albumin/Globulin Ratio 1.6 1.2 - 2.2   Bilirubin Total 0.4 0.0 - 1.2 mg/dL   Alkaline Phosphatase 107 44 - 121 IU/L   AST 20 0 - 40 IU/L   ALT 34 0 - 44 IU/L  Hepatitis C antibody  Result Value Ref Range   Hep C Virus Ab 0.1 0.0 - 0.9 s/co ratio  TSH  Result Value Ref Range   TSH 1.130 0.450 - 4.500 uIU/mL  PSA  Result Value Ref Range   Prostate Specific Ag, Serum 3.6 0.0 - 4.0 ng/mL  Lipid Panel w/o Chol/HDL Ratio  Result Value Ref Range   Cholesterol, Total 234 (H) 100 - 199 mg/dL   Triglycerides 293 (H) 0 - 149 mg/dL   HDL 27 (L) >39 mg/dL   VLDL Cholesterol Cal 55 (H) 5 - 40 mg/dL   LDL Chol Calc (NIH) 152 (H) 0 - 99 mg/dL      Assessment & Plan:   Problem List Items Addressed This Visit      Other   Anxiety - Primary    Chronic, ongoing with long term benzo use.  Has tried multiple medications in past and refuses to try again.  Refuses psychiatry referral.  At length discussion with patient on risks of long term benzo use, he is fully aware and wishes to continue current  treatment.  Is agreeable to yearly UDS and controlled subs contract.  Refills sent on Klonopin date for 03/12/21 #30 with 2 refills.  UDS obtained today and obtain contract next visit.  Return in 3 months.      Relevant Medications   clonazePAM (KLONOPIN) 1 MG tablet (Start on 03/12/2021)   Other Relevant Orders   [id number] 11+Oxyco+Alc+Crt-Bund   Long-term current use of benzodiazepine    Refer to anxiety plan of care -- UDS today and obtain controlled subs contract next visit when meets new PCP.      Relevant Orders   [id number] 11+Oxyco+Alc+Crt-Bund       Follow up plan: Return in about 3 months (around 05/17/2021) for Anxiety.

## 2021-02-18 ENCOUNTER — Encounter: Payer: Self-pay | Admitting: *Deleted

## 2021-02-18 LAB — DRUG SCREEN 764883 11+OXYCO+ALC+CRT-BUND
Amphetamines, Urine: NEGATIVE ng/mL
BENZODIAZ UR QL: NEGATIVE ng/mL
Barbiturate: NEGATIVE ng/mL
Cannabinoid Quant, Ur: NEGATIVE ng/mL
Cocaine (Metabolite): NEGATIVE ng/mL
Creatinine: 36 mg/dL (ref 20.0–300.0)
Ethanol: NEGATIVE %
Meperidine: NEGATIVE ng/mL
Methadone Screen, Urine: NEGATIVE ng/mL
OPIATE SCREEN URINE: NEGATIVE ng/mL
Oxycodone/Oxymorphone, Urine: NEGATIVE ng/mL
Phencyclidine: NEGATIVE ng/mL
Propoxyphene: NEGATIVE ng/mL
Tramadol: NEGATIVE ng/mL
pH, Urine: 7.2 (ref 4.5–8.9)

## 2021-02-26 ENCOUNTER — Other Ambulatory Visit: Payer: Self-pay | Admitting: Internal Medicine

## 2021-02-26 DIAGNOSIS — F419 Anxiety disorder, unspecified: Secondary | ICD-10-CM

## 2021-02-26 MED ORDER — ESOMEPRAZOLE MAGNESIUM 20 MG PO CPDR
20.0000 mg | DELAYED_RELEASE_CAPSULE | Freq: Every morning | ORAL | 1 refills | Status: DC
Start: 2021-02-26 — End: 2021-05-31

## 2021-02-26 MED ORDER — SERTRALINE HCL 50 MG PO TABS: 50.0000 mg | ORAL_TABLET | Freq: Every day | ORAL | 0 refills | Status: DC

## 2021-02-26 NOTE — Telephone Encounter (Signed)
Copied from [place] [id number]. Topic: Quick Communication - Rx Refill/Question >> Feb 26, 2021  1:34 PM Lenon Curt, Everette A wrote: Medication: clonazePAM (KLONOPIN) 1 MG tablet  sertraline (ZOLOFT) 50 MG tablet  esomeprazole (NEXIUM) 20 MG capsule   Has the patient contacted their pharmacy? Yes. Patient has spoken with pharmacy and been directed to contact PCP  Preferred Pharmacy (with phone number or street name): [facility], [place]  Phone:  [phone]  Agent: Please be advised that RX refills may take up to 3 business days. We ask that you follow-up with your pharmacy.

## 2021-02-26 NOTE — Telephone Encounter (Signed)
Future visit in 2 months  

## 2021-02-26 NOTE — Telephone Encounter (Signed)
Routing to provider  

## 2021-02-26 NOTE — Telephone Encounter (Signed)
Requested medication (s) are due for refill today: no  Requested medication (s) are on the active medication list: yes  Last refill:  03/12/21 #30 2 refills  Future visit scheduled: yes in 2 months   Notes to clinic:  not delegated per protocol, Patient requested med, scheduled to start medication on 03/12/21. Do you want to fill early?     Requested Prescriptions  Pending Prescriptions Disp Refills   clonazePAM (KLONOPIN) 1 MG tablet 30 tablet 2    Sig: TAKE 1 TABLET BY MOUTH DAILY AS NEEDED      Not Delegated - Psychiatry:  Anxiolytics/Hypnotics Failed - 02/26/2021  1:51 PM      Failed - This refill cannot be delegated      Passed - Urine Drug Screen completed in last 360 days      Passed - Valid encounter within last 6 months    Recent Outpatient Visits           1 week ago [place], Henrine Screws T, NP   3 weeks ago Screening for colon cancer   [place] Myles Gip, DO   4 months ago Routine general medical examination at a health care facility   [facility] Kathrine Haddock, NP   8 months ago [place], [place], [place]   10 months ago [place], Lilia Argue, [place]       Future Appointments             In 2 months Vigg, Avanti, MD [facility], [facility]              Signed Prescriptions Disp Refills   esomeprazole (NEXIUM) 20 MG capsule 90 capsule 1    Sig: Take 1 capsule (20 mg total) by mouth every morning.      Gastroenterology: Proton Pump Inhibitors Passed - 02/26/2021  1:51 PM      Passed - Valid encounter within last 12 months    Recent Outpatient Visits           1 week ago [place], [place] T, NP   3 weeks ago Screening for colon cancer   [facility] Myles Gip, DO   4 months ago Routine general medical examination at a health care facility   [facility] Kathrine Haddock, NP   8 months ago [place], [place], [place]   10 months ago [place], Lilia Argue, [place]       Future Appointments             In 2 months Vigg, Avanti, MD [facility], [facility]               sertraline (ZOLOFT) 50 MG tablet 30 tablet 0    Sig: Take 1 tablet (50 mg total) by mouth daily.      Psychiatry:  Antidepressants - SSRI Passed - 02/26/2021  1:51 PM      Passed - Valid encounter within last 6 months    Recent Outpatient Visits           1 week ago [place], [place] T, NP   3 weeks ago Screening for colon cancer   [place], DO   4 months ago Routine general medical examination at  a health care facility   [place], NP   8 months ago [place], [place], [place]   10 months ago [place], Lilia Argue, [place]       Future Appointments             In 2 months Vigg, Avanti, MD [facility], [place]

## 2021-03-08 ENCOUNTER — Other Ambulatory Visit: Payer: Self-pay | Admitting: Nurse Practitioner

## 2021-03-08 DIAGNOSIS — F419 Anxiety disorder, unspecified: Secondary | ICD-10-CM

## 2021-03-08 NOTE — Telephone Encounter (Signed)
Requested medication (s) are due for refill today:   Provider to review  Requested medication (s) are on the active medication list:   Yes  Future visit scheduled:   Yes with Dr. Neomia Dear in 2 mo.   Last ordered: 02/17/2021 #30, 2 refills  Non delegated refill    Requested Prescriptions  Pending Prescriptions Disp Refills   clonazePAM (KLONOPIN) 1 MG tablet [Pharmacy Med Name: CLONAZEPAM 1 MG TAB] 30 tablet     Sig: TAKE 1 TABLET BY MOUTH DAILY AS NEEDED      Not Delegated - Psychiatry:  Anxiolytics/Hypnotics Failed - 03/08/2021  9:55 AM      Failed - This refill cannot be delegated      Passed - Urine Drug Screen completed in last 360 days      Passed - Valid encounter within last 6 months    Recent Outpatient Visits           2 weeks ago [place], Henrine Screws T, NP   1 month ago Screening for colon cancer   [place], DO   5 months ago Routine general medical examination at a health care facility   [place], NP   9 months ago [place], [place], [place]   11 months ago [place], Lilia Argue, [place]       Future Appointments             In 2 months Vigg, Avanti, MD [facility], [place]

## 2021-03-08 NOTE — Telephone Encounter (Signed)
Pt called to speak with Jolene or nurse about the amount of pills he gets for his clonazePAM  Rx / Pt stated he used to get 45 due to sometimes having to take 2 tabs /Pt doesn't like to run out of the medication and wants to makes sure if needed he always has enough/ Pt would like to know if he can go back to getting 45 tabs in his refills/ please advise

## 2021-03-08 NOTE — Telephone Encounter (Signed)
Please advise 

## 2021-05-17 ENCOUNTER — Ambulatory Visit (INDEPENDENT_AMBULATORY_CARE_PROVIDER_SITE_OTHER): Payer: 59 | Admitting: Internal Medicine

## 2021-05-17 ENCOUNTER — Other Ambulatory Visit: Payer: Self-pay

## 2021-05-17 VITALS — BP 138/92 | HR 101 | Temp 98.0°F

## 2021-05-17 DIAGNOSIS — F419 Anxiety disorder, unspecified: Secondary | ICD-10-CM | POA: Diagnosis not present

## 2021-05-17 DIAGNOSIS — E782 Mixed hyperlipidemia: Secondary | ICD-10-CM

## 2021-05-17 DIAGNOSIS — R739 Hyperglycemia, unspecified: Secondary | ICD-10-CM | POA: Diagnosis not present

## 2021-05-17 DIAGNOSIS — Z1211 Encounter for screening for malignant neoplasm of colon: Secondary | ICD-10-CM

## 2021-05-17 DIAGNOSIS — K279 Peptic ulcer, site unspecified, unspecified as acute or chronic, without hemorrhage or perforation: Secondary | ICD-10-CM | POA: Diagnosis not present

## 2021-05-17 DIAGNOSIS — R972 Elevated prostate specific antigen [PSA]: Secondary | ICD-10-CM | POA: Diagnosis not present

## 2021-05-17 DIAGNOSIS — Z1329 Encounter for screening for other suspected endocrine disorder: Secondary | ICD-10-CM

## 2021-05-17 MED ORDER — CLONAZEPAM 1 MG PO TABS
ORAL_TABLET | ORAL | 0 refills | Status: DC
Start: 2021-05-17 — End: 2021-06-07

## 2021-05-17 NOTE — Progress Notes (Signed)
There were no vitals taken for this visit.   Subjective:    [name], male    DOB: 1966-09-04, 54 y.o.   MRN: [id number]  HPI: Johnny Rios is a 55 y.o. male  Anxiety Presents for initial (is on clonazepam ) visit. Patient reports no chest pain, compulsions, confusion, feeling of choking, irritability, malaise, nausea or nervous/anxious behavior. Primary symptoms comment: on prilosec for such x 6 years..    Gastroesophageal Reflux He reports no abdominal pain, no belching, no chest pain, no early satiety, no nausea, no sore throat or no stridor. on prilosec for such x 6 years..    No chief complaint on file.   Relevant past medical, surgical, family and social history reviewed and updated as indicated. Interim medical history since our last visit reviewed. Allergies and medications reviewed and updated.  Review of Systems  Constitutional: Negative for irritability.  HENT: Negative for sore throat.   Cardiovascular: Negative for chest pain.  Gastrointestinal: Negative for abdominal pain and nausea.  Psychiatric/Behavioral: Negative for confusion. The patient is not nervous/anxious.     Per HPI unless specifically indicated above     Objective:    There were no vitals taken for this visit.  Wt Readings from Last 3 Encounters:  02/17/21 276 lb 12.8 oz (125.6 kg)  02/03/21 270 lb (122.5 kg)  10/01/20 256 lb 9.6 oz (116.4 kg)    Physical Exam Vitals and nursing note reviewed.  Constitutional:      General: He is not in acute distress.    Appearance: Normal appearance. He is not ill-appearing or diaphoretic.  HENT:     Head: Normocephalic and atraumatic.     Right Ear: Tympanic membrane and external ear normal. There is no impacted cerumen.     Left Ear: External ear normal.     Nose: No congestion or rhinorrhea.     Mouth/Throat:     Pharynx: No oropharyngeal exudate or posterior oropharyngeal erythema.  Eyes:     Conjunctiva/sclera:  Conjunctivae normal.     Pupils: Pupils are equal, round, and reactive to light.  Cardiovascular:     Rate and Rhythm: Normal rate and regular rhythm.     Heart sounds: No murmur heard. No friction rub. No gallop.   Pulmonary:     Effort: No respiratory distress.     Breath sounds: No stridor. No wheezing or rhonchi.  Chest:     Chest wall: No tenderness.  Abdominal:     General: Abdomen is flat. Bowel sounds are normal.     Palpations: Abdomen is soft. There is no mass.     Tenderness: There is no abdominal tenderness.  Musculoskeletal:     Cervical back: Normal range of motion and neck supple. No rigidity or tenderness.     Left lower leg: No edema.  Skin:    General: Skin is warm and dry.  Neurological:     Mental Status: He is alert.     Results for orders placed or performed in visit on 02/17/21  [id number] 11+Oxyco+Alc+Crt-Bund  Result Value Ref Range   Ethanol Negative Cutoff=0.020 %   Amphetamines, Urine Negative Cutoff=1000 ng/mL   Barbiturate Negative Cutoff=200 ng/mL   BENZODIAZ UR QL Negative Cutoff=200 ng/mL   Cannabinoid Quant, Ur Negative Cutoff=50 ng/mL   Cocaine (Metabolite) Negative Cutoff=300 ng/mL   OPIATE SCREEN URINE Negative Cutoff=300 ng/mL   Oxycodone/Oxymorphone, Urine Negative Cutoff=300 ng/mL   Phencyclidine Negative Cutoff=25 ng/mL   Methadone Screen,  Urine Negative Cutoff=300 ng/mL   Propoxyphene Negative Cutoff=300 ng/mL   Meperidine Negative Cutoff=200 ng/mL   Tramadol Negative Cutoff=200 ng/mL   Creatinine 36.0 20.0 - 300.0 mg/dL   pH, Urine 7.2 4.5 - 8.9        Current Outpatient Medications:  .  clonazePAM (KLONOPIN) 1 MG tablet, TAKE 1 TABLET BY MOUTH DAILY AS NEEDED, Disp: 30 tablet, Rfl: 2 .  esomeprazole (NEXIUM) 20 MG capsule, Take 1 capsule (20 mg total) by mouth every morning., Disp: 90 capsule, Rfl: 1 .  sertraline (ZOLOFT) 50 MG tablet, Take 1 tablet (50 mg total) by mouth daily., Disp: 30 tablet, Rfl: 0    Assessment &  Plan:  1, Anxiety : To fu with psych for such  Will start pt on clonzapem 1 mg for such   2. GERD; stable on nexium  patient advised to avoid laying down soon after his meals. He took a 2 hours between dinner and bedtime. Avoid spicy food and triggers that he knows food wise that worsen his acid reflux. Patient verbalized understanding of the above. Lifestyle modifications as above discussed with patient.  3.abdominal/ umbilical Hernia x 3 sees Gen surg for such fu and mx per such   4. Depression is on zoloft for such   Problem List Items Addressed This Visit   None      Follow up plan: No follow-ups on file.  CBC, CMP, FLP, HBA1C, TSH,PSA Labs 1 week prior to next visit.

## 2021-05-21 ENCOUNTER — Encounter: Payer: Self-pay | Admitting: Internal Medicine

## 2021-05-31 ENCOUNTER — Other Ambulatory Visit: Payer: 59

## 2021-05-31 ENCOUNTER — Other Ambulatory Visit: Payer: Self-pay

## 2021-05-31 ENCOUNTER — Telehealth: Payer: Self-pay | Admitting: *Deleted

## 2021-05-31 DIAGNOSIS — Z1329 Encounter for screening for other suspected endocrine disorder: Secondary | ICD-10-CM

## 2021-05-31 DIAGNOSIS — R972 Elevated prostate specific antigen [PSA]: Secondary | ICD-10-CM

## 2021-05-31 DIAGNOSIS — K279 Peptic ulcer, site unspecified, unspecified as acute or chronic, without hemorrhage or perforation: Secondary | ICD-10-CM

## 2021-05-31 DIAGNOSIS — R5382 Chronic fatigue, unspecified: Secondary | ICD-10-CM

## 2021-05-31 DIAGNOSIS — F419 Anxiety disorder, unspecified: Secondary | ICD-10-CM

## 2021-05-31 DIAGNOSIS — R739 Hyperglycemia, unspecified: Secondary | ICD-10-CM

## 2021-05-31 DIAGNOSIS — E782 Mixed hyperlipidemia: Secondary | ICD-10-CM

## 2021-05-31 MED ORDER — ESOMEPRAZOLE MAGNESIUM 20 MG PO CPDR: 20.0000 mg | DELAYED_RELEASE_CAPSULE | Freq: Every morning | ORAL | 1 refills | Status: DC

## 2021-05-31 NOTE — Telephone Encounter (Signed)
Can this be sent to [facility], was sent to the wrong pharmacy

## 2021-05-31 NOTE — Telephone Encounter (Signed)
Prescription sent to [facility] on [address].

## 2021-05-31 NOTE — Telephone Encounter (Signed)
Pt need esomeprazole (Nexium) sent to [facility] on [address] please thanks

## 2021-06-01 ENCOUNTER — Other Ambulatory Visit: Payer: Self-pay

## 2021-06-01 DIAGNOSIS — E782 Mixed hyperlipidemia: Secondary | ICD-10-CM

## 2021-06-01 LAB — COMPREHENSIVE METABOLIC PANEL
ALT: 29 IU/L (ref 0–44)
AST: 19 IU/L (ref 0–40)
Albumin/Globulin Ratio: 1.5 (ref 1.2–2.2)
Albumin: 4.1 g/dL (ref 3.8–4.9)
Alkaline Phosphatase: 110 IU/L (ref 44–121)
BUN/Creatinine Ratio: 13 (ref 9–20)
BUN: 12 mg/dL (ref 6–24)
Bilirubin Total: 0.3 mg/dL (ref 0.0–1.2)
CO2: 22 mmol/L (ref 20–29)
Calcium: 9.5 mg/dL (ref 8.7–10.2)
Chloride: 99 mmol/L (ref 96–106)
Creatinine, Ser: 0.93 mg/dL (ref 0.76–1.27)
Globulin, Total: 2.8 g/dL (ref 1.5–4.5)
Glucose: 136 mg/dL — ABNORMAL HIGH (ref 65–99)
Potassium: 4.3 mmol/L (ref 3.5–5.2)
Sodium: 136 mmol/L (ref 134–144)
Total Protein: 6.9 g/dL (ref 6.0–8.5)
eGFR: 98 mL/min/{1.73_m2} (ref >59)

## 2021-06-01 LAB — LIPID PANEL W/O CHOL/HDL RATIO
Cholesterol, Total: 210 mg/dL — ABNORMAL HIGH (ref 100–199)
HDL: 28 mg/dL — ABNORMAL LOW (ref >39)
LDL Chol Calc (NIH): 105 mg/dL — ABNORMAL HIGH (ref 0–99)
Triglycerides: 452 mg/dL — ABNORMAL HIGH (ref 0–149)
VLDL Cholesterol Cal: 77 mg/dL — ABNORMAL HIGH (ref 5–40)

## 2021-06-01 LAB — CBC WITH DIFFERENTIAL/PLATELET
Basophils Absolute: 0.1 10*3/uL (ref 0.0–0.2)
Basos: 1 %
EOS (ABSOLUTE): 0.2 10*3/uL (ref 0.0–0.4)
Eos: 3 %
Hematocrit: 43.7 % (ref 37.5–51.0)
Hemoglobin: 14.1 g/dL (ref 13.0–17.7)
Immature Grans (Abs): 0 10*3/uL (ref 0.0–0.1)
Immature Granulocytes: 0 %
Lymphocytes Absolute: 2.9 10*3/uL (ref 0.7–3.1)
Lymphs: 39 %
MCH: 26 pg — ABNORMAL LOW (ref 26.6–33.0)
MCHC: 32.3 g/dL (ref 31.5–35.7)
MCV: 81 fL (ref 79–97)
Monocytes Absolute: 0.4 10*3/uL (ref 0.1–0.9)
Monocytes: 5 %
Neutrophils Absolute: 3.9 10*3/uL (ref 1.4–7.0)
Neutrophils: 52 %
Platelets: 340 10*3/uL (ref 150–450)
RBC: 5.43 x10E6/uL (ref 4.14–5.80)
RDW: 13.1 % (ref 11.6–15.4)
WBC: 7.6 10*3/uL (ref 3.4–10.8)

## 2021-06-01 LAB — THYROID PANEL WITH TSH
Free Thyroxine Index: 1.8 (ref 1.2–4.9)
T3 Uptake Ratio: 25 % (ref 24–39)
T4, Total: 7.2 ug/dL (ref 4.5–12.0)
TSH: 1.08 u[IU]/mL (ref 0.450–4.500)

## 2021-06-01 LAB — PSA TOTAL+% FREE (SERIAL)
PSA, Free Pct: 22.1 %
PSA, Free: 0.62 ng/mL
Prostate Specific Ag, Serum: 2.8 ng/mL (ref 0.0–4.0)

## 2021-06-01 LAB — HGB A1C W/O EAG: Hgb A1c MFr Bld: 6.7 % — ABNORMAL HIGH (ref 4.8–5.6)

## 2021-06-01 MED ORDER — FENOFIBRATE 160 MG PO TABS: 160.0000 mg | ORAL_TABLET | Freq: Every day | ORAL | 1 refills | Status: DC

## 2021-06-07 ENCOUNTER — Other Ambulatory Visit: Payer: Self-pay

## 2021-06-07 ENCOUNTER — Encounter: Payer: Self-pay | Admitting: Internal Medicine

## 2021-06-07 ENCOUNTER — Ambulatory Visit (INDEPENDENT_AMBULATORY_CARE_PROVIDER_SITE_OTHER): Payer: 59 | Admitting: Internal Medicine

## 2021-06-07 VITALS — BP 123/78 | HR 82 | Temp 98.5°F | Ht 69.49 in | Wt 273.0 lb

## 2021-06-07 DIAGNOSIS — F419 Anxiety disorder, unspecified: Secondary | ICD-10-CM | POA: Diagnosis not present

## 2021-06-07 DIAGNOSIS — E119 Type 2 diabetes mellitus without complications: Secondary | ICD-10-CM

## 2021-06-07 DIAGNOSIS — E782 Mixed hyperlipidemia: Secondary | ICD-10-CM

## 2021-06-07 DIAGNOSIS — K279 Peptic ulcer, site unspecified, unspecified as acute or chronic, without hemorrhage or perforation: Secondary | ICD-10-CM

## 2021-06-07 MED ORDER — CLONAZEPAM 1 MG PO TABS: ORAL_TABLET | ORAL | 1 refills | Status: DC

## 2021-06-07 MED ORDER — ESOMEPRAZOLE MAGNESIUM 20 MG PO CPDR: 20.0000 mg | DELAYED_RELEASE_CAPSULE | Freq: Every morning | ORAL | 1 refills | Status: DC

## 2021-06-07 MED ORDER — FENOFIBRATE 160 MG PO TABS: 160.0000 mg | ORAL_TABLET | Freq: Every day | ORAL | 6 refills | Status: DC

## 2021-06-07 NOTE — Progress Notes (Signed)
BP 123/78   Pulse 82   Temp 98.5 F (36.9 C) (Oral)    Subjective:    [name], male    DOB: 1966-07-26, 55 y.o.   MRN: [id number]  Chief Complaint  Patient presents with  . Hyperlipidemia  . Labwork review    HPI: Johnny Rios is a 55 y.o. male  Hyperlipidemia This is a chronic problem. The problem is uncontrolled. Recent lipid tests were reviewed and are variable. Pertinent negatives include no chest pain.  Diabetes He presents for his initial (a1c 6.7 new diagnosis) diabetic visit. He has type 2 diabetes mellitus. Associated symptoms include fatigue, polydipsia, polyphagia and polyuria. Pertinent negatives for diabetes include no blurred vision, no chest pain, no foot ulcerations, no visual change, no weakness and no weight loss.   Chief Complaint  Patient presents with  . Hyperlipidemia  . Labwork review    Relevant past medical, surgical, family and social history reviewed and updated as indicated. Interim medical history since our last visit reviewed. Allergies and medications reviewed and updated.  Review of Systems  Constitutional:  Positive for fatigue. Negative for weight loss.  Eyes:  Negative for blurred vision.  Cardiovascular:  Negative for chest pain.  Endocrine: Positive for polydipsia, polyphagia and polyuria.  Neurological:  Negative for weakness.   Per HPI unless specifically indicated above     Objective:    BP 123/78   Pulse 82   Temp 98.5 F (36.9 C) (Oral)   Wt Readings from Last 3 Encounters:  02/17/21 276 lb 12.8 oz (125.6 kg)  02/03/21 270 lb (122.5 kg)  10/01/20 256 lb 9.6 oz (116.4 kg)    Physical Exam Vitals and nursing note reviewed.  Constitutional:      General: He is not in acute distress.    Appearance: Normal appearance. He is not ill-appearing or diaphoretic.  HENT:     Head: Normocephalic and atraumatic.     Right Ear: Tympanic membrane and external ear normal. There is no impacted cerumen.      Left Ear: External ear normal.     Nose: No congestion or rhinorrhea.     Mouth/Throat:     Pharynx: No oropharyngeal exudate or posterior oropharyngeal erythema.  Eyes:     Conjunctiva/sclera: Conjunctivae normal.     Pupils: Pupils are equal, round, and reactive to light.  Cardiovascular:     Rate and Rhythm: Normal rate and regular rhythm.     Heart sounds: No murmur heard.   No friction rub. No gallop.  Pulmonary:     Effort: No respiratory distress.     Breath sounds: No wheezing.  Musculoskeletal:     Cervical back: Normal range of motion and neck supple. No rigidity or tenderness.     Left lower leg: No edema.  Neurological:     Mental Status: He is alert.   Results for orders placed or performed in visit on 05/31/21  Hgb A1c w/o eAG  Result Value Ref Range   Hgb A1c MFr Bld 6.7 (H) 4.8 - 5.6 %  Lipid Panel w/o Chol/HDL Ratio  Result Value Ref Range   Cholesterol, Total 210 (H) 100 - 199 mg/dL   Triglycerides 452 (H) 0 - 149 mg/dL   HDL 28 (L) >39 mg/dL   VLDL Cholesterol Cal 77 (H) 5 - 40 mg/dL   LDL Chol Calc (NIH) 105 (H) 0 - 99 mg/dL  Comprehensive metabolic panel  Result Value Ref Range  Glucose 136 (H) 65 - 99 mg/dL   BUN 12 6 - 24 mg/dL   Creatinine, Ser 0.93 0.76 - 1.27 mg/dL   eGFR 98 >59 mL/min/1.73   BUN/Creatinine Ratio 13 9 - 20   Sodium 136 134 - 144 mmol/L   Potassium 4.3 3.5 - 5.2 mmol/L   Chloride 99 96 - 106 mmol/L   CO2 22 20 - 29 mmol/L   Calcium 9.5 8.7 - 10.2 mg/dL   Total Protein 6.9 6.0 - 8.5 g/dL   Albumin 4.1 3.8 - 4.9 g/dL   Globulin, Total 2.8 1.5 - 4.5 g/dL   Albumin/Globulin Ratio 1.5 1.2 - 2.2   Bilirubin Total 0.3 0.0 - 1.2 mg/dL   Alkaline Phosphatase 110 44 - 121 IU/L   AST 19 0 - 40 IU/L   ALT 29 0 - 44 IU/L  CBC with Differential/Platelet  Result Value Ref Range   WBC 7.6 3.4 - 10.8 x10E3/uL   RBC 5.43 4.14 - 5.80 x10E6/uL   Hemoglobin 14.1 13.0 - 17.7 g/dL   Hematocrit 43.7 37.5 - 51.0 %   MCV 81 79 - 97 fL   MCH  26.0 (L) 26.6 - 33.0 pg   MCHC 32.3 31.5 - 35.7 g/dL   RDW 13.1 11.6 - 15.4 %   Platelets 340 150 - 450 x10E3/uL   Neutrophils 52 Not Estab. %   Lymphs 39 Not Estab. %   Monocytes 5 Not Estab. %   Eos 3 Not Estab. %   Basos 1 Not Estab. %   Neutrophils Absolute 3.9 1.4 - 7.0 x10E3/uL   Lymphocytes Absolute 2.9 0.7 - 3.1 x10E3/uL   Monocytes Absolute 0.4 0.1 - 0.9 x10E3/uL   EOS (ABSOLUTE) 0.2 0.0 - 0.4 x10E3/uL   Basophils Absolute 0.1 0.0 - 0.2 x10E3/uL   Immature Granulocytes 0 Not Estab. %   Immature Grans (Abs) 0.0 0.0 - 0.1 x10E3/uL  Thyroid Panel With TSH  Result Value Ref Range   TSH 1.080 0.450 - 4.500 uIU/mL   T4, Total 7.2 4.5 - 12.0 ug/dL   T3 Uptake Ratio 25 24 - 39 %   Free Thyroxine Index 1.8 1.2 - 4.9  PSA Total+%Free (Serial)  Result Value Ref Range   Prostate Specific Ag, Serum 2.8 0.0 - 4.0 ng/mL   PSA, Free 0.62 N/A ng/mL   PSA, Free Pct 22.1 %        Current Outpatient Medications:  .  clonazePAM (KLONOPIN) 1 MG tablet, TAKE 1 TABLET BY MOUTH DAILY AS NEEDED, Disp: 35 tablet, Rfl: 0 .  esomeprazole (NEXIUM) 20 MG capsule, Take 1 capsule (20 mg total) by mouth every morning., Disp: 90 capsule, Rfl: 1 .  fenofibrate 160 MG tablet, Take 1 tablet (160 mg total) by mouth daily., Disp: 30 tablet, Rfl: 1    Assessment & Plan:  DM consider farxiga / jardiacne.  check HbA1c,  urine  microalbumin  diabetic diet plan given to pt  adviced regarding hypoglycemia and instructions given to pt today on how to prevent and treat the same if it were to occur. pt acknowledges the plan and voices understanding of the same.  exercise plan given and encouraged.   advice diabetic yearly podiatry, ophthalmology , nutritionist , dental check q 6 months   HLD is on fenofibrate.  recheck FLP, check LFT's work on diet, SE of meds explained to pt. low fat and high fiber diet explained to pt.  Ref. Range 10/01/2020 15:59 02/17/2021 16:44 05/31/2021 09:14  Cholesterol,  Total Latest Ref  Range: 100 - 199 mg/dL 234 (H)  210 (H)  HDL Cholesterol Latest Ref Range: >39 mg/dL 27 (L)  28 (L)  Triglycerides Latest Ref Range: 0 - 149 mg/dL 293 (H)  452 (H)  VLDL Cholesterol Cal Latest Ref Range: 5 - 40 mg/dL 55 (H)  77 (H)  LDL Chol Calc (NIH) Latest Ref Range: 0 - 99 mg/dL 152 (H)  105 (H)   Problem List Items Addressed This Visit       Digestive   PUD (peptic ulcer disease) - Primary   Relevant Medications   esomeprazole (NEXIUM) 20 MG capsule     Follow up plan: No follow-ups on file.

## 2021-06-14 ENCOUNTER — Encounter: Payer: Self-pay | Admitting: Emergency Medicine

## 2021-06-14 ENCOUNTER — Emergency Department: Payer: 59

## 2021-06-14 ENCOUNTER — Other Ambulatory Visit: Payer: Self-pay

## 2021-06-14 ENCOUNTER — Inpatient Hospital Stay: Payer: 59

## 2021-06-14 ENCOUNTER — Inpatient Hospital Stay
Admission: EM | Admit: 2021-06-14 | Discharge: 2021-06-16 | DRG: 390 | Disposition: A | Discharge status: Home / Self Care | Payer: 59 | Attending: Surgery | Admitting: Surgery

## 2021-06-14 DIAGNOSIS — Z6839 Body mass index (BMI) 39.0-39.9, adult: Secondary | ICD-10-CM | POA: Diagnosis not present

## 2021-06-14 DIAGNOSIS — M5136 Other intervertebral disc degeneration, lumbar region: Secondary | ICD-10-CM | POA: Diagnosis present

## 2021-06-14 DIAGNOSIS — E119 Type 2 diabetes mellitus without complications: Secondary | ICD-10-CM | POA: Diagnosis present

## 2021-06-14 DIAGNOSIS — E669 Obesity, unspecified: Secondary | ICD-10-CM | POA: Diagnosis present

## 2021-06-14 DIAGNOSIS — F419 Anxiety disorder, unspecified: Secondary | ICD-10-CM | POA: Diagnosis present

## 2021-06-14 DIAGNOSIS — K449 Diaphragmatic hernia without obstruction or gangrene: Secondary | ICD-10-CM | POA: Diagnosis present

## 2021-06-14 DIAGNOSIS — Z820 Family history of epilepsy and other diseases of the nervous system: Secondary | ICD-10-CM

## 2021-06-14 DIAGNOSIS — N200 Calculus of kidney: Secondary | ICD-10-CM | POA: Diagnosis present

## 2021-06-14 DIAGNOSIS — Z8249 Family history of ischemic heart disease and other diseases of the circulatory system: Secondary | ICD-10-CM

## 2021-06-14 DIAGNOSIS — Z20822 Contact with and (suspected) exposure to covid-19: Secondary | ICD-10-CM | POA: Diagnosis present

## 2021-06-14 DIAGNOSIS — K56609 Unspecified intestinal obstruction, unspecified as to partial versus complete obstruction: Secondary | ICD-10-CM

## 2021-06-14 DIAGNOSIS — M1990 Unspecified osteoarthritis, unspecified site: Secondary | ICD-10-CM | POA: Diagnosis present

## 2021-06-14 DIAGNOSIS — E785 Hyperlipidemia, unspecified: Secondary | ICD-10-CM | POA: Diagnosis present

## 2021-06-14 DIAGNOSIS — Z87891 Personal history of nicotine dependence: Secondary | ICD-10-CM

## 2021-06-14 DIAGNOSIS — I700 Atherosclerosis of aorta: Secondary | ICD-10-CM | POA: Diagnosis present

## 2021-06-14 DIAGNOSIS — K219 Gastro-esophageal reflux disease without esophagitis: Secondary | ICD-10-CM | POA: Diagnosis present

## 2021-06-14 DIAGNOSIS — K56600 Partial intestinal obstruction, unspecified as to cause: Secondary | ICD-10-CM | POA: Diagnosis not present

## 2021-06-14 DIAGNOSIS — Z79899 Other long term (current) drug therapy: Secondary | ICD-10-CM

## 2021-06-14 DIAGNOSIS — G4733 Obstructive sleep apnea (adult) (pediatric): Secondary | ICD-10-CM | POA: Diagnosis present

## 2021-06-14 DIAGNOSIS — Z0189 Encounter for other specified special examinations: Secondary | ICD-10-CM

## 2021-06-14 DIAGNOSIS — R000 Tachycardia, unspecified: Secondary | ICD-10-CM | POA: Diagnosis present

## 2021-06-14 LAB — COMPREHENSIVE METABOLIC PANEL
ALT: 65 U/L — ABNORMAL HIGH (ref 0–44)
AST: 43 U/L — ABNORMAL HIGH (ref 15–41)
Albumin: 4.7 g/dL (ref 3.5–5.0)
Alkaline Phosphatase: 84 U/L (ref 38–126)
Anion gap: 11 (ref 5–15)
BUN: 13 mg/dL (ref 6–20)
CO2: 24 mmol/L (ref 22–32)
Calcium: 9.9 mg/dL (ref 8.9–10.3)
Chloride: 101 mmol/L (ref 98–111)
Creatinine, Ser: 1.03 mg/dL (ref 0.61–1.24)
GFR, Estimated: >60 mL/min (ref >60)
Glucose, Bld: 172 mg/dL — ABNORMAL HIGH (ref 70–99)
Potassium: 3.9 mmol/L (ref 3.5–5.1)
Sodium: 136 mmol/L (ref 135–145)
Total Bilirubin: 1.2 mg/dL (ref 0.3–1.2)
Total Protein: 8.7 g/dL — ABNORMAL HIGH (ref 6.5–8.1)

## 2021-06-14 LAB — CBC
HCT: 44.9 % (ref 39.0–52.0)
Hemoglobin: 15.3 g/dL (ref 13.0–17.0)
MCH: 26.6 pg (ref 26.0–34.0)
MCHC: 34.1 g/dL (ref 30.0–36.0)
MCV: 78.1 fL — ABNORMAL LOW (ref 80.0–100.0)
Platelets: 373 10*3/uL (ref 150–400)
RBC: 5.75 MIL/uL (ref 4.22–5.81)
RDW: 12.9 % (ref 11.5–15.5)
WBC: 16.3 10*3/uL — ABNORMAL HIGH (ref 4.0–10.5)
nRBC: 0 % (ref 0.0–0.2)

## 2021-06-14 LAB — LIPASE, BLOOD: Lipase: 30 U/L (ref 11–51)

## 2021-06-14 LAB — TROPONIN I (HIGH SENSITIVITY)
Troponin I (High Sensitivity): 4 ng/L (ref <18)
Troponin I (High Sensitivity): 2 ng/L (ref <18)

## 2021-06-14 LAB — RESP PANEL BY RT-PCR (FLU A&B, COVID) ARPGX2
Influenza A by PCR: NEGATIVE
Influenza B by PCR: NEGATIVE
SARS Coronavirus 2 by RT PCR: NEGATIVE

## 2021-06-14 MED ORDER — FENOFIBRATE 160 MG PO TABS
160.0000 mg | ORAL_TABLET | Freq: Every day | ORAL | Status: DC
  Administered 2021-06-15 – 2021-06-16 (×2): 160 mg via ORAL
  Filled 2021-06-14 (×2): qty 1

## 2021-06-14 MED ORDER — MORPHINE SULFATE (PF) 2 MG/ML IV SOLN
2.0000 mg | INTRAVENOUS | Status: DC | PRN
  Administered 2021-06-15: 2 mg via INTRAVENOUS
  Filled 2021-06-14: qty 1

## 2021-06-14 MED ORDER — LACTATED RINGERS IV BOLUS
1000.0000 mL | Freq: Once | INTRAVENOUS | Status: AC
  Administered 2021-06-14: 1000 mL via INTRAVENOUS

## 2021-06-14 MED ORDER — ONDANSETRON HCL 4 MG/2ML IJ SOLN
4.0000 mg | Freq: Four times a day (QID) | INTRAMUSCULAR | Status: DC | PRN
  Filled 2021-06-14: qty 2

## 2021-06-14 MED ORDER — DIATRIZOATE MEGLUMINE & SODIUM 66-10 % PO SOLN
90.0000 mL | Freq: Once | ORAL | Status: AC
  Administered 2021-06-15: 90 mL via NASOGASTRIC

## 2021-06-14 MED ORDER — MORPHINE SULFATE (PF) 4 MG/ML IV SOLN
4.0000 mg | Freq: Once | INTRAVENOUS | Status: AC
  Administered 2021-06-14: 4 mg via INTRAVENOUS
  Filled 2021-06-14: qty 1

## 2021-06-14 MED ORDER — LACTATED RINGERS IV SOLN: INTRAVENOUS | Status: DC

## 2021-06-14 MED ORDER — ONDANSETRON HCL 4 MG/2ML IJ SOLN
4.0000 mg | Freq: Once | INTRAMUSCULAR | Status: AC
  Administered 2021-06-14: 4 mg via INTRAVENOUS
  Filled 2021-06-14: qty 2

## 2021-06-14 MED ORDER — TRAMADOL HCL 50 MG PO TABS: 50.0000 mg | ORAL_TABLET | Freq: Four times a day (QID) | ORAL | Status: DC | PRN

## 2021-06-14 MED ORDER — ENOXAPARIN SODIUM 60 MG/0.6ML IJ SOSY
0.5000 mg/kg | PREFILLED_SYRINGE | INTRAMUSCULAR | Status: DC
  Administered 2021-06-15 – 2021-06-16 (×2): 60 mg via SUBCUTANEOUS
  Filled 2021-06-14 (×2): qty 0.6

## 2021-06-14 MED ORDER — PANTOPRAZOLE SODIUM 40 MG PO TBEC
40.0000 mg | DELAYED_RELEASE_TABLET | Freq: Every day | ORAL | Status: DC
  Administered 2021-06-15 – 2021-06-16 (×2): 40 mg via ORAL
  Filled 2021-06-14 (×2): qty 1

## 2021-06-14 MED ORDER — DOCUSATE SODIUM 100 MG PO CAPS: 100.0000 mg | ORAL_CAPSULE | Freq: Two times a day (BID) | ORAL | Status: DC | PRN

## 2021-06-14 MED ORDER — ONDANSETRON 4 MG PO TBDP: 4.0000 mg | ORAL_TABLET | Freq: Four times a day (QID) | ORAL | Status: DC | PRN

## 2021-06-14 MED ORDER — HYDROCODONE-ACETAMINOPHEN 5-325 MG PO TABS: 1.0000 | ORAL_TABLET | ORAL | Status: DC | PRN

## 2021-06-14 MED ORDER — CLONAZEPAM 1 MG PO TABS
1.0000 mg | ORAL_TABLET | Freq: Every day | ORAL | Status: DC | PRN
  Administered 2021-06-15 – 2021-06-16 (×2): 1 mg via ORAL
  Filled 2021-06-14: qty 2
  Filled 2021-06-14: qty 1

## 2021-06-14 MED ORDER — IOHEXOL 300 MG/ML  SOLN
125.0000 mL | Freq: Once | INTRAMUSCULAR | Status: AC | PRN
  Administered 2021-06-14: 125 mL via INTRAVENOUS

## 2021-06-14 MED ORDER — INSULIN ASPART 100 UNIT/ML IJ SOLN
0.0000 [IU] | Freq: Three times a day (TID) | INTRAMUSCULAR | Status: DC
  Administered 2021-06-16: 2 [IU] via SUBCUTANEOUS
  Filled 2021-06-14: qty 1

## 2021-06-14 NOTE — ED Provider Notes (Signed)
[facility] Emergency Department Provider Note   ____________________________________________   Event Date/Time   First MD Initiated Contact with Patient 06/14/21 1941     (approximate)  I have reviewed the triage vital signs and the nursing notes.   HISTORY  Chief Complaint Abdominal Pain    HPI Johnny Rios is a 55 y.o. male with past medical history of hyperlipidemia, diabetes, arthritis, and GERD who presents to the ED complaining of abdominal pain.  Patient reports that he developed significant pain in his abdomen diffusely around 3 PM this afternoon.  Pain has been gradually worsening since then and seems to affect his upper abdomen more than the lower portion.  It is been associated with nausea and multiple episodes of vomiting, but he reports normal bowel movement since the onset of pain.  He does state that the pain seems to move up into his chest on occasion but he denies any fevers, cough, or shortness of breath.  He has never had similar symptoms in the past, reports prior umbilical hernia surgery but denies similar symptoms at that time.  Patient became lightheaded and diaphoretic during triage evaluation and was subsequently brought back to a room.        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   DDD (degenerative disc disease), lumbar 04/16/2018   Low testosterone 10/09/2017   Elevated glucose 09/26/2017   Mixed hyperlipidemia 09/26/2017   Chronic fatigue 07/26/2017   PUD (peptic ulcer disease) 10/31/2016   Obesity 12/02/2015   OSA on CPAP 12/02/2015   Anxiety     Past  Surgical History:  Procedure Laterality Date   APPENDECTOMY     CHOLECYSTECTOMY N/A 01/19/2017   Procedure: LAPAROSCOPIC CHOLECYSTECTOMY WITH INTRAOPERATIVE CHOLANGIOGRAM;  Surgeon: Robert Bellow, MD;  Location: [facility] ORS;  Service: General;  Laterality: N/A;   LACERATION REPAIR Left 04/04/2019   Procedure: Repair Multiple Lacerations;  Surgeon: Leanora Cover, MD;  Location: [place];  Service: Orthopedics;  Laterality: Left;   MINOR NAILBED REPAIR Left 04/04/2019   Procedure: Minor Nailbed Repair;  Surgeon: Leanora Cover, MD;  Location: [place];  Service: Orthopedics;  Laterality: Left;   NERVE AND TENDON REPAIR Left 04/04/2019   Procedure: IRRIGATION AND DEBRIDEMENT OF FIXATION OF OPEN FRACTURES LONG ,RING FINGER;  Surgeon: Leanora Cover, MD;  Location: [place];  Service: Orthopedics;  Laterality: Left;   TONSILLECTOMY     UMBILICAL HERNIA REPAIR N/A 06/12/2017   Ventral hernia with 6 cm Ventralex ST mesh.   VENTRAL HERNIA REPAIR N/A 07/11/2018   15 x 20 cm intraperitoneal Ventralex mesh  ;  Surgeon: Robert Bellow, MD;  Location: [facility] ORS;  Service: General;  Laterality: N/A;    Prior to Admission medications   Medication Sig Start Date End Date Taking? Authorizing Provider  clonazePAM (KLONOPIN) 1 MG tablet TAKE 1 TABLET BY MOUTH DAILY AS NEEDED 06/07/21   Vigg, Avanti, MD  esomeprazole (NEXIUM) 20 MG capsule Take 1 capsule (20 mg total) by mouth every morning. 06/07/21   Vigg, Avanti, MD  fenofibrate 160 MG tablet Take 1 tablet (160  mg total) by mouth daily. 06/07/21   Charlynne Cousins, MD    Allergies Patient has no known allergies.  Family History  Problem Relation Age of Onset   Cancer Father        lung   Alzheimer's disease Mother    Heart attack Brother    Heart attack Brother     Social History Social History   Tobacco Use   Smoking status: Never   Smokeless tobacco: Former    Types: Nurse, children's Use: Never used  Substance Use Topics   Alcohol use: Yes     Comment: occasional   Drug use: No    Review of Systems  Constitutional: No fever/chills Eyes: No visual changes. ENT: No sore throat. Cardiovascular: Denies chest pain. Respiratory: Denies shortness of breath. Gastrointestinal: Positive for abdominal pain, nausea, and vomiting.  No diarrhea.  No constipation. Genitourinary: Negative for dysuria. Musculoskeletal: Negative for back pain. Skin: Negative for rash. Neurological: Negative for headaches, focal weakness or numbness.  ____________________________________________   PHYSICAL EXAM:  VITAL SIGNS: ED Triage Vitals  Enc Vitals Group     BP 06/14/21 1933 132/87     Pulse Rate 06/14/21 1933 (!) 115     Resp 06/14/21 1933 20     Temp 06/14/21 1933 97.6 F (36.4 C)     Temp Source 06/14/21 1933 Oral     SpO2 06/14/21 1933 100 %     Weight 06/14/21 1934 267 lb (121.1 kg)     Height 06/14/21 1934 5\' 9"  (1.753 m)     Head Circumference --      Peak Flow --      Pain Score 06/14/21 1934 10     Pain Loc --      Pain Edu? --      Excl. in [place]? --     Constitutional: Alert and oriented. Eyes: Conjunctivae are normal. Head: Atraumatic. Nose: No congestion/rhinnorhea. Mouth/Throat: Mucous membranes are moist. Neck: Normal ROM Cardiovascular: Tachycardic, regular rhythm. Grossly normal heart sounds.  2+ radial pulses bilaterally. Respiratory: Normal respiratory effort.  No retractions. Lungs CTAB. Gastrointestinal: Soft and diffusely tender to palpation, greatest in the upper abdomen, voluntary guarding noted.  No distention. Genitourinary: deferred Musculoskeletal: No lower extremity tenderness nor edema. Neurologic:  Normal speech and language. No gross focal neurologic deficits are appreciated. Skin:  Skin is warm, dry and intact. No rash noted. Psychiatric: Mood and affect are normal. Speech and behavior are normal.  ____________________________________________   LABS (all labs ordered are listed, but only abnormal  results are displayed)  Labs Reviewed  CBC - Abnormal; Notable for the following components:      Result Value   WBC 16.3 (*)    MCV 78.1 (*)    All other components within normal limits  COMPREHENSIVE METABOLIC PANEL - Abnormal; Notable for the following components:   Glucose, Bld 172 (*)    Total Protein 8.7 (*)    AST 43 (*)    ALT 65 (*)    All other components within normal limits  RESP PANEL BY RT-PCR (FLU A&B, COVID) ARPGX2  LIPASE, BLOOD  TROPONIN I (HIGH SENSITIVITY)  TROPONIN I (HIGH SENSITIVITY)   ____________________________________________  EKG  ED ECG REPORT I, Blake Divine, the attending physician, personally viewed and interpreted this ECG.   Date: 06/14/2021  EKG Time: 19:35  Rate: 81  Rhythm: normal sinus rhythm  Axis: Normal  Intervals:none  ST&T Change: None   PROCEDURES  Procedure(s) performed (including Critical Care):  Procedures   ____________________________________________   INITIAL IMPRESSION / ASSESSMENT AND PLAN / ED COURSE      55 year old male with past medical history of hyperlipidemia, diabetes, GERD, and arthritis who presents to the ED complaining of acute onset abdominal pain around 3 PM this afternoon that has been gradually worsening since then associated with nausea and vomiting.  Patient became lightheaded and diaphoretic during triage evaluation and immediately brought back to her room, although vitals remained stable at this time.  EKG shows sinus tachycardia but no ischemic changes, we will screen troponin given patient describes pain moving up into his chest.  Patient has significant tenderness in his upper abdomen with associated guarding, chest x-ray reviewed by me and shows no obvious free air, we will further assess with CT of abdomen/pelvis.  We will treat symptomatically with IV morphine and Zofran, hydrate with IV fluids.  CT scan significant for small bowel obstruction with transition point just distal to prior  umbilical hernia repair.  Umbilical hernia repair does not appear to be involved, general surgery asked to assess patient.  Patient evaluated by Dr. Lysle Pearl who will plan for admission for further management of bowel obstruction.      ____________________________________________   FINAL CLINICAL IMPRESSION(S) / ED DIAGNOSES  Final diagnoses:  SBO (small bowel obstruction) [facility])     ED Discharge Orders     None        Note:  This document was prepared using Dragon voice recognition software and may include unintentional dictation errors.    Blake Divine, MD 06/14/21 [phone]

## 2021-06-14 NOTE — ED Notes (Signed)
Pt c/o abdominal pain since 1500 today. Per triage RN, pt vomited and had syncopal episode while in waiting. Pt brought immediately back to room. Pt is diaphoretic and pale on arrival, alert, sts he does not remember vomiting. Pt reports pain radiates into chest.  Hx of hernia repair with mesh. Denies cardiac hx.

## 2021-06-14 NOTE — H&P (Signed)
Subjective:   CC: SBO  HPI:  Johnny Rios is a 55 y.o. male who was consulted by [facility] for issue above.  Symptoms were first noted 1 day ago. Pain is sharp, confined to the periumbilical area, without radiation.  Associated with N/V, exacerbated by palpation.  N/V started after pain, last BM was prior to pain occurrence.  Has not been passing gas or had another BM since.     Past Medical History:  has a past medical history of Anxiety, Arthritis, GERD (gastroesophageal reflux disease), Sleep apnea, and Tachycardia.  Past Surgical History:  Past Surgical History:  Procedure Laterality Date   APPENDECTOMY     CHOLECYSTECTOMY N/A 01/19/2017   Procedure: LAPAROSCOPIC CHOLECYSTECTOMY WITH INTRAOPERATIVE CHOLANGIOGRAM;  Surgeon: Robert Bellow, MD;  Location: [facility];  Service: General;  Laterality: N/A;   LACERATION REPAIR Left 04/04/2019   Procedure: Repair Multiple Lacerations;  Surgeon: Leanora Cover, MD;  Location: [place];  Service: Orthopedics;  Laterality: Left;   MINOR NAILBED REPAIR Left 04/04/2019   Procedure: Minor Nailbed Repair;  Surgeon: Leanora Cover, MD;  Location: [place];  Service: Orthopedics;  Laterality: Left;   NERVE AND TENDON REPAIR Left 04/04/2019   Procedure: IRRIGATION AND DEBRIDEMENT OF FIXATION OF OPEN FRACTURES LONG ,RING FINGER;  Surgeon: Leanora Cover, MD;  Location: [place];  Service: Orthopedics;  Laterality: Left;   TONSILLECTOMY     UMBILICAL HERNIA REPAIR N/A 06/12/2017   Ventral hernia with 6 cm Ventralex ST mesh.   VENTRAL HERNIA REPAIR N/A 07/11/2018   15 x 20 cm intraperitoneal Ventralex mesh  ;  Surgeon: Robert Bellow, MD;  Location: [facility];  Service: General;  Laterality: N/A;    Family History: family history includes Alzheimer's disease in his mother; Cancer in his father; Heart attack in his brother and brother.  Social History:  reports that he has never smoked. He has quit using smokeless tobacco.  His smokeless tobacco use included chew. He  reports current alcohol use. He reports that he does not use drugs.  Current Medications:  Prior to Admission medications   Medication Sig Start Date End Date Taking? Authorizing Provider  clonazePAM (KLONOPIN) 1 MG tablet TAKE 1 TABLET BY MOUTH DAILY AS NEEDED 06/07/21   Vigg, Avanti, MD  esomeprazole (NEXIUM) 20 MG capsule Take 1 capsule (20 mg total) by mouth every morning. 06/07/21   Vigg, Avanti, MD  fenofibrate 160 MG tablet Take 1 tablet (160 mg total) by mouth daily. 06/07/21   Charlynne Cousins, MD    Allergies:  Allergies as of 06/14/2021   (No Known Allergies)    ROS:  General: Denies weight loss, weight gain, fatigue, fevers, chills, and night sweats. Eyes: Denies blurry vision, double vision, eye pain, itchy eyes, and tearing. Ears: Denies hearing loss, earache, and ringing in ears. Nose: Denies sinus pain, congestion, infections, runny nose, and nosebleeds. Mouth/throat: Denies hoarseness, sore throat, bleeding gums, and difficulty swallowing. Heart: Denies chest pain, palpitations, racing heart, irregular heartbeat, leg pain or swelling, and decreased activity tolerance. Respiratory: Denies breathing difficulty, shortness of breath, wheezing, cough, and sputum. GI: Denies change in appetite, heartburn, nausea, vomiting, constipation, diarrhea, and blood in stool. GU: Denies difficulty urinating, pain with urinating, urgency, frequency, blood in urine. Musculoskeletal: Denies joint stiffness, pain, swelling, muscle weakness. Skin: Denies rash, itching, mass, tumors, sores, and boils Neurologic: Denies headache, fainting, dizziness, seizures, numbness, and tingling. Psychiatric: Denies depression, anxiety, difficulty sleeping, and memory loss. Endocrine: Denies heat or cold intolerance, and  increased thirst or urination. Blood/lymph: Denies easy bruising, easy bruising, and swollen glands     Objective:     BP 124/89   Pulse 91   Temp 97.8 F (36.6 C) (Oral)   Resp 11    Ht 5\' 9"  (1.753 m)   Wt 121.1 kg   SpO2 97%   BMI 39.43 kg/m   Constitutional :  alert, cooperative, appears stated age, and no distress  Lymphatics/Throat:  no asymmetry, masses, or scars  Respiratory:  clear to auscultation bilaterally  Cardiovascular:  regular rate and rhythm  Gastrointestinal: Soft, no guarding, but with TTP periumbilical, no sign of obvious hernia recurrence .   Musculoskeletal: Steady movement  Skin: Cool and moist, visible surgical scars   Psychiatric: Normal affect, non-agitated, not confused       LABS:  CMP Latest Ref Rng & Units 06/14/2021 05/31/2021 10/01/2020  Glucose 70 - 99 mg/dL 172(H) 136(H) 103(H)  BUN 6 - 20 mg/dL 13 12 11   Creatinine 0.61 - 1.24 mg/dL 1.03 0.93 0.95  Sodium 135 - 145 mmol/L 136 136 138  Potassium 3.5 - 5.1 mmol/L 3.9 4.3 4.2  Chloride 98 - 111 mmol/L 101 99 102  CO2 22 - 32 mmol/L 24 22 23   Calcium 8.9 - 10.3 mg/dL 9.9 9.5 9.4  Total Protein 6.5 - 8.1 g/dL 8.7(H) 6.9 7.1  Total Bilirubin 0.3 - 1.2 mg/dL 1.2 0.3 0.4  Alkaline Phos 38 - 126 U/L 84 110 107  AST 15 - 41 U/L 43(H) 19 20  ALT 0 - 44 U/L 65(H) 29 34   CBC Latest Ref Rng & Units 06/14/2021 05/31/2021 10/01/2020  WBC 4.0 - 10.5 K/uL 16.3(H) 7.6 7.2  Hemoglobin 13.0 - 17.0 g/dL 15.3 14.1 13.4  Hematocrit 39.0 - 52.0 % 44.9 43.7 40.2  Platelets 150 - 400 K/uL 373 340 310    RADS: CLINICAL DATA:  Acute nonlocalized abdominal pain.  Vomiting.   EXAM: CT ABDOMEN AND PELVIS WITH CONTRAST   TECHNIQUE: Multidetector CT imaging of the abdomen and pelvis was performed using the standard protocol following bolus administration of intravenous contrast.   CONTRAST:  118mL OMNIPAQUE IOHEXOL 300 MG/ML  SOLN   COMPARISON:  Radiograph earlier today.  Abdominopelvic CT 03/14/2019   FINDINGS: Lower chest: Stable tiny subpleural nodule in the right middle lobe, series 4, image 6. Is unchanged from 03/22/2018 exam and considered benign. Minor right lower lobe atelectasis. No  pleural fluid small hiatal hernia with intraluminal fluid in the hiatal hernia and distal esophagus.   Hepatobiliary: Diffusely decreased hepatic density consistent with steatosis. No focal hepatic abnormality. Clips in the gallbladder fossa postcholecystectomy. No biliary dilatation.   Pancreas: No ductal dilatation or inflammation.   Spleen: Normal in size without focal abnormality.   Adrenals/Urinary Tract: Is a 12 mm low-density nodule involving the right adrenal gland. This is slightly increased in size from prior exam. Normal left adrenal gland. Two nonobstructing stones in the upper right kidney. No hydronephrosis. No perinephric edema. No suspicious renal lesion. Nondistended urinary bladder.   Stomach/Bowel: Small hiatal hernia with fluid distending the hiatal hernia and distal esophagus. Stomach is mildly dilated and fluid-filled. There are fluid-filled loops of proximal small bowel. Fecalized small bowel loop enter broad-based eventration of the anterior abdominal wall at site of prior hernia repair with mesh. There is transition from dilated to nondilated small bowel just beyond the postsurgical change. Transition is not within the hernia/area of eventration. More distal small bowel is decompressed. Appendectomy. Small  volume of colonic stool. Left colonic diverticulosis without diverticulitis.   Vascular/Lymphatic: Minimal aorto bi-iliac atherosclerosis. No aortic aneurysm. Patent portal vein. No mesenteric or portal venous gas. No abdominopelvic adenopathy.   Reproductive: Prominent prostate gland spans 4.6 cm.   Other: Prior ventral abdominal wall hernia repair with mesh. There is broad-based eventration of the anterior abdominal wall at the hernia mesh. No free air or ascites.   Musculoskeletal: Bilateral L4 pars interarticularis defects with grade 1 anterolisthesis of L4 on L5.   IMPRESSION: 1. Small-bowel obstruction with transition point just beyond  prior umbilical hernia repair. 2. Hepatic steatosis. 3. Nonobstructing right nephrolithiasis. 4. Colonic diverticulosis without diverticulitis. 5. Low-density right adrenal nodule measuring 12 mm, slightly increased in size from prior exam, likely adenoma. 6. Bilateral L4 pars interarticularis defects with grade 1 anterolisthesis of L4 on L5.   Aortic Atherosclerosis (ICD10-I70.0).     Electronically Signed   By: Keith Rake M.D.   On: 06/14/2021 21:25   Assessment:   SBO Hx of hernia repair- but no obvious hernia clinically DM  Plan:     Discussed pathophisiology and treatment options including NG tube decompression and possible surgery for lysis of adhesions, laparoscopic or open.    The patient verbalized understanding and all questions were answered to the patient's satisfaction.  NG decompression, fluids, SBFT protocol. And continue to monitor  Continue home DM, SSI.

## 2021-06-14 NOTE — ED Triage Notes (Signed)
Pt in with co generalized abd pain hx of hernia but states pain is different. Pt also co vomiting, states started today at 1500.

## 2021-06-14 NOTE — ED Provider Notes (Signed)
Emergency Medicine Provider Triage Evaluation Note  BRAZOS SANDOVAL , a 55 y.o. male  was evaluated in triage.  Pt complains of abdominal pain which started around lunchtime today.  He also has nausea.  Review of Systems  Positive: Nausea vomiting, loose stools Negative: No fever  Physical Exam  There were no vitals taken for this visit. Gen:   Awake, no distress   Resp:  Normal effort  MSK:   Moves extremities without difficulty  Other:  Abdomen: Mild tenderness in the epigastrium, no significant distention  Medical Decision Making  Medically screening exam initiated at 7:33 PM.  Appropriate orders placed.  JOANDY BURGET was informed that the remainder of the evaluation will be completed by another provider, this initial triage assessment does not replace that evaluation, and the importance of remaining in the ED until their evaluation is complete.  Patient with abdominal pain, nausea.  We will obtain labs, give IV morphine, IV Zofran obtain CT abdomen pelvis.   Lavonia Drafts, MD 06/14/21 [id number]

## 2021-06-15 ENCOUNTER — Encounter: Payer: Self-pay | Admitting: Surgery

## 2021-06-15 ENCOUNTER — Inpatient Hospital Stay: Payer: 59

## 2021-06-15 LAB — BASIC METABOLIC PANEL
Anion gap: 7 (ref 5–15)
BUN: 11 mg/dL (ref 6–20)
CO2: 25 mmol/L (ref 22–32)
Calcium: 9.1 mg/dL (ref 8.9–10.3)
Chloride: 104 mmol/L (ref 98–111)
Creatinine, Ser: 0.86 mg/dL (ref 0.61–1.24)
GFR, Estimated: >60 mL/min (ref >60)
Glucose, Bld: 150 mg/dL — ABNORMAL HIGH (ref 70–99)
Potassium: 4.1 mmol/L (ref 3.5–5.1)
Sodium: 136 mmol/L (ref 135–145)

## 2021-06-15 LAB — CBG MONITORING, ED
Glucose-Capillary: 140 mg/dL — ABNORMAL HIGH (ref 70–99)
Glucose-Capillary: 128 mg/dL — ABNORMAL HIGH (ref 70–99)

## 2021-06-15 LAB — CBC
HCT: 41.3 % (ref 39.0–52.0)
Hemoglobin: 13.6 g/dL (ref 13.0–17.0)
MCH: 26.3 pg (ref 26.0–34.0)
MCHC: 32.9 g/dL (ref 30.0–36.0)
MCV: 79.7 fL — ABNORMAL LOW (ref 80.0–100.0)
Platelets: 292 10*3/uL (ref 150–400)
RBC: 5.18 MIL/uL (ref 4.22–5.81)
RDW: 12.9 % (ref 11.5–15.5)
WBC: 14.3 10*3/uL — ABNORMAL HIGH (ref 4.0–10.5)
nRBC: 0 % (ref 0.0–0.2)

## 2021-06-15 LAB — HIV ANTIBODY (ROUTINE TESTING W REFLEX): HIV Screen 4th Generation wRfx: NONREACTIVE

## 2021-06-15 LAB — GLUCOSE, CAPILLARY: Glucose-Capillary: 114 mg/dL — ABNORMAL HIGH (ref 70–99)

## 2021-06-15 LAB — PHOSPHORUS: Phosphorus: 3.2 mg/dL (ref 2.5–4.6)

## 2021-06-15 LAB — MAGNESIUM: Magnesium: 2.1 mg/dL (ref 1.7–2.4)

## 2021-06-15 MED ORDER — CLONAZEPAM 0.5 MG PO TABS
0.5000 mg | ORAL_TABLET | Freq: Once | ORAL | Status: AC
  Administered 2021-06-15: 0.5 mg via ORAL
  Filled 2021-06-15: qty 1

## 2021-06-15 MED ORDER — PHENOL 1.4 % MT LIQD
1.0000 | OROMUCOSAL | Status: DC | PRN
  Administered 2021-06-15: 1 via OROMUCOSAL
  Filled 2021-06-15 (×2): qty 177

## 2021-06-15 MED ORDER — ONDANSETRON HCL 4 MG/2ML IJ SOLN
4.0000 mg | Freq: Once | INTRAMUSCULAR | Status: AC
  Administered 2021-06-15: 4 mg via INTRAVENOUS
  Filled 2021-06-15: qty 2

## 2021-06-15 NOTE — Progress Notes (Signed)
Anticoagulation monitoring(Lovenox):  55 yo male ordered Lovenox 40 mg Q24h    Filed Weights   06/14/21 1934  Weight: 121.1 kg (267 lb)   BMI   39.43  Lab Results  Component Value Date   CREATININE 0.86 06/15/2021   CREATININE 1.03 06/14/2021   CREATININE 0.93 05/31/2021   Estimated Creatinine Clearance: 127.7 mL/min (by C-G formula based on SCr of 0.86 mg/dL). Hemoglobin & Hematocrit     Component Value Date/Time   HGB 13.6 06/15/2021 0329   HGB 14.1 05/31/2021 0914   HCT 41.3 06/15/2021 0329   HCT 43.7 05/31/2021 0914     Per Protocol for Patient with estCrcl > 30 ml/min and BMI > 30, will transition to Lovenox 60  mg Q24h.

## 2021-06-15 NOTE — ED Notes (Signed)
Pt incontinent of watery stool, pt linen changed and pt up to the bathroom with a steady gait.

## 2021-06-15 NOTE — ED Notes (Signed)
No change in condition, will continue to monitor.  

## 2021-06-15 NOTE — ED Notes (Signed)
Dee RN aware of assigned bed 

## 2021-06-15 NOTE — ED Notes (Signed)
NG tube discontinued by provider and removed by this RN. Pt tolerated well. Pt provided with beverage. No further needs expressed. Wife at bedside.

## 2021-06-15 NOTE — ED Notes (Signed)
RT in room, pt states he does not want to wear cpap with ng tube in place.

## 2021-06-16 LAB — BASIC METABOLIC PANEL
Anion gap: 4 — ABNORMAL LOW (ref 5–15)
BUN: 10 mg/dL (ref 6–20)
CO2: 29 mmol/L (ref 22–32)
Calcium: 9 mg/dL (ref 8.9–10.3)
Chloride: 107 mmol/L (ref 98–111)
Creatinine, Ser: 0.84 mg/dL (ref 0.61–1.24)
GFR, Estimated: >60 mL/min (ref >60)
Glucose, Bld: 109 mg/dL — ABNORMAL HIGH (ref 70–99)
Potassium: 3.9 mmol/L (ref 3.5–5.1)
Sodium: 140 mmol/L (ref 135–145)

## 2021-06-16 LAB — CBC
HCT: 39.8 % (ref 39.0–52.0)
Hemoglobin: 13.2 g/dL (ref 13.0–17.0)
MCH: 26.8 pg (ref 26.0–34.0)
MCHC: 33.2 g/dL (ref 30.0–36.0)
MCV: 80.7 fL (ref 80.0–100.0)
Platelets: 256 10*3/uL (ref 150–400)
RBC: 4.93 MIL/uL (ref 4.22–5.81)
RDW: 13.1 % (ref 11.5–15.5)
WBC: 7.1 10*3/uL (ref 4.0–10.5)
nRBC: 0 % (ref 0.0–0.2)

## 2021-06-16 LAB — MAGNESIUM: Magnesium: 2.4 mg/dL (ref 1.7–2.4)

## 2021-06-16 LAB — GLUCOSE, CAPILLARY: Glucose-Capillary: 143 mg/dL — ABNORMAL HIGH (ref 70–99)

## 2021-06-16 LAB — PHOSPHORUS: Phosphorus: 2.8 mg/dL (ref 2.5–4.6)

## 2021-06-16 NOTE — Discharge Summary (Signed)
Physician Discharge Summary  [name] MRN: [id number] DOB/AGE: 1966-05-23 55 y.o.  Admit date: 06/14/2021 Discharge date: 06/16/21  Admission Diagnoses: SBO  Discharge Diagnoses:  Same as above  Discharged Condition: good  Hospital Course: admitted for above.  NG tube decompression, bowel rest. Subsequent small bowel follow through showed contrast in colon with pain resolution and mult BMs. Diet advanced as tolerated.  At time of d/c no pain, tolerating regular diet. F/u prn  Consults: None  Discharge Exam: Blood pressure 118/81, pulse 84, temperature 97.9 F (36.6 C), temperature source Oral, resp. rate 18, height 5\' 9"  (1.753 m), weight 121.1 kg, SpO2 98 %. General appearance: alert, cooperative, and no distress GI: soft, non-tender; bowel sounds normal; no masses,  no organomegaly  Disposition:  Discharge disposition: 01-Home or Self Care       Discharge Instructions     Discharge patient   Complete by: As directed    ONCE TOLERATING REGULAR DIET   Discharge disposition: 01-Home or Self Care   Discharge patient date: 06/16/2021      Allergies as of 06/16/2021   No Known Allergies      Medication List     TAKE these medications    clonazePAM 1 MG tablet Commonly known as: KLONOPIN TAKE 1 TABLET BY MOUTH DAILY AS NEEDED   esomeprazole 20 MG capsule Commonly known as: NEXIUM Take 1 capsule (20 mg total) by mouth every morning.   fenofibrate 160 MG tablet Take 1 tablet (160 mg total) by mouth daily.          Total time spent arranging discharge was >28min. Signed: Benjamine Sprague 06/16/2021, 12:42 PM

## 2021-06-16 NOTE — Plan of Care (Signed)
  Problem: Clinical Measurements: Goal: Ability to maintain clinical measurements within normal limits will improve Outcome: Progressing Goal: Will remain free from infection Outcome: Progressing Goal: Diagnostic test results will improve Outcome: Progressing Goal: Respiratory complications will improve Outcome: Progressing Goal: Cardiovascular complication will be avoided Outcome: Progressing   Problem: Pain Managment: Goal: General experience of comfort will improve Outcome: Progressing   Pt is involved in and agrees with the plan of care. V/S stable. Denies any pain. Tolerated full liquid diet. Passing gas but no BM this shift.

## 2021-06-16 NOTE — Progress Notes (Signed)
Patient discharged via wheelchair to home accompanied by family.  Patient discharged with all pertinent information, prescription, and personal belongings.  Patient able to teach back all instructions.  IV d/ced. No acute distress noted. Care relinquished.

## 2021-06-16 NOTE — Progress Notes (Signed)
Patient not wearing CPAP

## 2021-06-17 ENCOUNTER — Telehealth: Payer: Self-pay

## 2021-06-17 NOTE — Telephone Encounter (Signed)
Transition Care Management Follow-up Telephone Call Date of discharge and from where: 06/16/2021 [facility] How have you been since you were released from the hospital? Feeling better Any questions or concerns? No  Items Reviewed: Did the pt receive and understand the discharge instructions provided? Yes  Medications obtained and verified? Yes  Other? No  Any new allergies since your discharge? No  Dietary orders reviewed? Yes Do you have support at home? Yes   Home Care and Equipment/Supplies: Were home health services ordered? not applicable If so, what is the name of the agency? N/a  Has the agency set up a time to come to the patient's home? not applicable Were any new equipment or medical supplies ordered?  No What is the name of the medical supply agency? N/a Were you able to get the supplies/equipment? not applicable Do you have any questions related to the use of the equipment or supplies? No  Functional Questionnaire: (I = Independent and D = Dependent) ADLs: I  Bathing/Dressing- I  Meal Prep- I  Eating- I  Maintaining continence- I  Transferring/Ambulation- I  Managing Meds- I  Follow up appointments reviewed:  PCP Hospital f/u appt confirmed? Yes  Scheduled to see Dr. Neomia Dear on 06/22/2021 @ 2:40. Are transportation arrangements needed? No  If their condition worsens, is the pt aware to call PCP or go to the Emergency Dept.? Yes Was the patient provided with contact information for the PCP's office or ED? Yes Was to pt encouraged to call back with questions or concerns? Yes

## 2021-06-21 ENCOUNTER — Encounter: Payer: Self-pay | Admitting: Internal Medicine

## 2021-06-21 ENCOUNTER — Telehealth: Payer: Self-pay

## 2021-06-21 NOTE — Telephone Encounter (Signed)
Called pt to r/s 6/28 appt no answer left vm

## 2021-06-22 ENCOUNTER — Inpatient Hospital Stay: Payer: 59 | Admitting: Internal Medicine

## 2021-06-23 ENCOUNTER — Encounter: Payer: Self-pay | Admitting: Internal Medicine

## 2021-06-23 ENCOUNTER — Ambulatory Visit (INDEPENDENT_AMBULATORY_CARE_PROVIDER_SITE_OTHER): Payer: 59 | Admitting: Internal Medicine

## 2021-06-23 ENCOUNTER — Other Ambulatory Visit: Payer: Self-pay

## 2021-06-23 ENCOUNTER — Telehealth: Payer: Self-pay

## 2021-06-23 VITALS — BP 102/70 | HR 88 | Temp 98.5°F | Ht 69.49 in | Wt 264.6 lb

## 2021-06-23 DIAGNOSIS — K56609 Unspecified intestinal obstruction, unspecified as to partial versus complete obstruction: Secondary | ICD-10-CM | POA: Diagnosis not present

## 2021-06-23 MED ORDER — ONDANSETRON HCL 8 MG PO TABS: 8.0000 mg | ORAL_TABLET | Freq: Three times a day (TID) | ORAL | 0 refills | Status: DC | PRN

## 2021-06-23 NOTE — Telephone Encounter (Signed)
PA started for Esomeprazole Magnesium 20MG  DR. Through cover my meds, awaiting on determination

## 2021-06-23 NOTE — Progress Notes (Signed)
Ht 5' 9.49" (1.765 m)   Wt 264 lb 9.6 oz (120 kg)   BMI 38.53 kg/m    Subjective:    [name], male    DOB: 04-26-1966, 55 y.o.   MRN: [id number]  Chief Complaint  Patient presents with  . Bowel Obstruction    At [facility] for 3 days, starting to feel better    HPI: Johnny Rios is a 55 y.o. male  Pt I shere for a hospital follow up was admitted for SBO sec to scarring from previous surgeries , 3 yrs ago Gall bladder surg per pt he had complications - had an umbilical hernia was released had further cx. Saw Dr.   @ [facility]   Admittedo n 6/20  D/c 06/16/21  Had a small bowel obstruction and with pain, pt was given bowel rest with NG decompressions.  Chief Complaint  Patient presents with  . Bowel Obstruction    At [facility] for 3 days, starting to feel better    Relevant past medical, surgical, family and social history reviewed and updated as indicated. Interim medical history since our last visit reviewed. Allergies and medications reviewed and updated.  Review of Systems  Constitutional:  Negative for activity change, appetite change, chills, fatigue and fever.  HENT:  Negative for congestion, ear discharge, ear pain and facial swelling.   Eyes:  Negative for pain, discharge and itching.  Respiratory:  Negative for cough, chest tightness, shortness of breath and wheezing.   Cardiovascular:  Negative for chest pain, palpitations and leg swelling.  Gastrointestinal:  Negative for abdominal distention, abdominal pain, constipation, diarrhea, nausea and vomiting.       Ate a hamburger the other day and felt sick a little taking walks and felt sick from that.  Endocrine: Negative for cold intolerance, heat intolerance, polydipsia, polyphagia and polyuria.  Genitourinary:  Negative for difficulty urinating, dysuria, flank pain, frequency, hematuria and urgency.  Musculoskeletal:  Negative for arthralgias, gait problem, joint swelling and  myalgias.  Skin:  Negative for color change, rash and wound.  Neurological:  Negative for dizziness, tremors, speech difficulty, weakness, light-headedness and numbness.  Hematological:  Does not bruise/bleed easily.  Psychiatric/Behavioral:  Negative for agitation, confusion, decreased concentration, sleep disturbance and suicidal ideas.    Per HPI unless specifically indicated above     Objective:    Ht 5' 9.49" (1.765 m)   Wt 264 lb 9.6 oz (120 kg)   BMI 38.53 kg/m   Wt Readings from Last 3 Encounters:  06/23/21 264 lb 9.6 oz (120 kg)  06/14/21 267 lb (121.1 kg)  06/07/21 273 lb (123.8 kg)    Physical Exam Vitals and nursing note reviewed.  Constitutional:      General: He is not in acute distress.    Appearance: Normal appearance. He is not ill-appearing or diaphoretic.  HENT:     Head: Normocephalic and atraumatic.     Right Ear: Tympanic membrane and external ear normal. There is no impacted cerumen.     Left Ear: External ear normal.     Nose: No congestion or rhinorrhea.     Mouth/Throat:     Pharynx: No oropharyngeal exudate or posterior oropharyngeal erythema.  Eyes:     Conjunctiva/sclera: Conjunctivae normal.     Pupils: Pupils are equal, round, and reactive to light.  Cardiovascular:     Rate and Rhythm: Normal rate and regular rhythm.     Heart sounds: No murmur heard.  No friction rub. No gallop.  Pulmonary:     Effort: No respiratory distress.     Breath sounds: No stridor. No wheezing or rhonchi.  Chest:     Chest wall: No tenderness.  Abdominal:     General: Abdomen is flat. Bowel sounds are normal.     Palpations: Abdomen is soft. There is no mass.     Tenderness: There is no abdominal tenderness.  Musculoskeletal:     Cervical back: Normal range of motion and neck supple. No rigidity or tenderness.     Left lower leg: No edema.  Skin:    General: Skin is warm and dry.  Neurological:     Mental Status: He is alert.   Results for orders  placed or performed during the hospital encounter of 06/14/21  Resp Panel by RT-PCR (Flu A&B, Covid) Nasopharyngeal Swab   Specimen: Nasopharyngeal Swab; Nasopharyngeal(NP) swabs in vial transport medium  Result Value Ref Range   SARS Coronavirus 2 by RT PCR NEGATIVE NEGATIVE   Influenza A by PCR NEGATIVE NEGATIVE   Influenza B by PCR NEGATIVE NEGATIVE  CBC  Result Value Ref Range   WBC 16.3 (H) 4.0 - 10.5 K/uL   RBC 5.75 4.22 - 5.81 MIL/uL   Hemoglobin 15.3 13.0 - 17.0 g/dL   HCT 44.9 39.0 - 52.0 %   MCV 78.1 (L) 80.0 - 100.0 fL   MCH 26.6 26.0 - 34.0 pg   MCHC 34.1 30.0 - 36.0 g/dL   RDW 12.9 11.5 - 15.5 %   Platelets 373 150 - 400 K/uL   nRBC 0.0 0.0 - 0.2 %  Comprehensive metabolic panel  Result Value Ref Range   Sodium 136 135 - 145 mmol/L   Potassium 3.9 3.5 - 5.1 mmol/L   Chloride 101 98 - 111 mmol/L   CO2 24 22 - 32 mmol/L   Glucose, Bld 172 (H) 70 - 99 mg/dL   BUN 13 6 - 20 mg/dL   Creatinine, Ser 1.03 0.61 - 1.24 mg/dL   Calcium 9.9 8.9 - 10.3 mg/dL   Total Protein 8.7 (H) 6.5 - 8.1 g/dL   Albumin 4.7 3.5 - 5.0 g/dL   AST 43 (H) 15 - 41 U/L   ALT 65 (H) 0 - 44 U/L   Alkaline Phosphatase 84 38 - 126 U/L   Total Bilirubin 1.2 0.3 - 1.2 mg/dL   GFR, Estimated >60 >60 mL/min   Anion gap 11 5 - 15  Lipase, blood  Result Value Ref Range   Lipase 30 11 - 51 U/L  Basic metabolic panel  Result Value Ref Range   Sodium 136 135 - 145 mmol/L   Potassium 4.1 3.5 - 5.1 mmol/L   Chloride 104 98 - 111 mmol/L   CO2 25 22 - 32 mmol/L   Glucose, Bld 150 (H) 70 - 99 mg/dL   BUN 11 6 - 20 mg/dL   Creatinine, Ser 0.86 0.61 - 1.24 mg/dL   Calcium 9.1 8.9 - 10.3 mg/dL   GFR, Estimated >60 >60 mL/min   Anion gap 7 5 - 15  Magnesium  Result Value Ref Range   Magnesium 2.1 1.7 - 2.4 mg/dL  Phosphorus  Result Value Ref Range   Phosphorus 3.2 2.5 - 4.6 mg/dL  CBC  Result Value Ref Range   WBC 14.3 (H) 4.0 - 10.5 K/uL   RBC 5.18 4.22 - 5.81 MIL/uL   Hemoglobin 13.6 13.0 -  17.0 g/dL   HCT 41.3 39.0 -  52.0 %   MCV 79.7 (L) 80.0 - 100.0 fL   MCH 26.3 26.0 - 34.0 pg   MCHC 32.9 30.0 - 36.0 g/dL   RDW 12.9 11.5 - 15.5 %   Platelets 292 150 - 400 K/uL   nRBC 0.0 0.0 - 0.2 %  HIV Antibody (routine testing w rflx)  Result Value Ref Range   HIV Screen 4th Generation wRfx Non Reactive Non Reactive  Basic metabolic panel  Result Value Ref Range   Sodium 140 135 - 145 mmol/L   Potassium 3.9 3.5 - 5.1 mmol/L   Chloride 107 98 - 111 mmol/L   CO2 29 22 - 32 mmol/L   Glucose, Bld 109 (H) 70 - 99 mg/dL   BUN 10 6 - 20 mg/dL   Creatinine, Ser 0.84 0.61 - 1.24 mg/dL   Calcium 9.0 8.9 - 10.3 mg/dL   GFR, Estimated >60 >60 mL/min   Anion gap 4 (L) 5 - 15  Magnesium  Result Value Ref Range   Magnesium 2.4 1.7 - 2.4 mg/dL  Phosphorus  Result Value Ref Range   Phosphorus 2.8 2.5 - 4.6 mg/dL  CBC  Result Value Ref Range   WBC 7.1 4.0 - 10.5 K/uL   RBC 4.93 4.22 - 5.81 MIL/uL   Hemoglobin 13.2 13.0 - 17.0 g/dL   HCT 39.8 39.0 - 52.0 %   MCV 80.7 80.0 - 100.0 fL   MCH 26.8 26.0 - 34.0 pg   MCHC 33.2 30.0 - 36.0 g/dL   RDW 13.1 11.5 - 15.5 %   Platelets 256 150 - 400 K/uL   nRBC 0.0 0.0 - 0.2 %  Glucose, capillary  Result Value Ref Range   Glucose-Capillary 114 (H) 70 - 99 mg/dL   Comment 1 Notify RN   Glucose, capillary  Result Value Ref Range   Glucose-Capillary 143 (H) 70 - 99 mg/dL   Comment 1 Notify RN    Comment 2 Document in Chart   CBG monitoring, ED  Result Value Ref Range   Glucose-Capillary 140 (H) 70 - 99 mg/dL  CBG monitoring, ED  Result Value Ref Range   Glucose-Capillary 128 (H) 70 - 99 mg/dL  Troponin I (High Sensitivity)  Result Value Ref Range   Troponin I (High Sensitivity) 4 <18 ng/L  Troponin I (High Sensitivity)  Result Value Ref Range   Troponin I (High Sensitivity) 2 <18 ng/L        Current Outpatient Medications:  .  clonazePAM (KLONOPIN) 1 MG tablet, TAKE 1 TABLET BY MOUTH DAILY AS NEEDED, Disp: 30 tablet, Rfl: 1 .   esomeprazole (NEXIUM) 20 MG capsule, Take 1 capsule (20 mg total) by mouth every morning., Disp: 90 capsule, Rfl: 1 .  fenofibrate 160 MG tablet, Take 1 tablet (160 mg total) by mouth daily., Disp: 30 tablet, Rfl: 6    Assessment & Plan:  SBO stable.  Diet advanced now feels better.   Problem List Items Addressed This Visit   None    No orders of the defined types were placed in this encounter.    No orders of the defined types were placed in this encounter.    Follow up plan: No follow-ups on file.

## 2021-06-29 ENCOUNTER — Other Ambulatory Visit: Payer: 59

## 2021-06-29 ENCOUNTER — Other Ambulatory Visit: Payer: Self-pay

## 2021-06-29 DIAGNOSIS — E119 Type 2 diabetes mellitus without complications: Secondary | ICD-10-CM

## 2021-06-29 LAB — BAYER DCA HB A1C WAIVED: HB A1C (BAYER DCA - WAIVED): 6.4 % (ref <7.0)

## 2021-06-30 LAB — COMPREHENSIVE METABOLIC PANEL
ALT: 41 IU/L (ref 0–44)
AST: 25 IU/L (ref 0–40)
Albumin/Globulin Ratio: 1.8 (ref 1.2–2.2)
Albumin: 4.7 g/dL (ref 3.8–4.9)
Alkaline Phosphatase: 76 IU/L (ref 44–121)
BUN/Creatinine Ratio: 8 — ABNORMAL LOW (ref 9–20)
BUN: 9 mg/dL (ref 6–24)
Bilirubin Total: 0.3 mg/dL (ref 0.0–1.2)
CO2: 19 mmol/L — ABNORMAL LOW (ref 20–29)
Calcium: 9.6 mg/dL (ref 8.7–10.2)
Chloride: 101 mmol/L (ref 96–106)
Creatinine, Ser: 1.12 mg/dL (ref 0.76–1.27)
Globulin, Total: 2.6 g/dL (ref 1.5–4.5)
Glucose: 104 mg/dL — ABNORMAL HIGH (ref 65–99)
Potassium: 4.2 mmol/L (ref 3.5–5.2)
Sodium: 137 mmol/L (ref 134–144)
Total Protein: 7.3 g/dL (ref 6.0–8.5)
eGFR: 79 mL/min/{1.73_m2} (ref >59)

## 2021-06-30 LAB — CBC WITH DIFFERENTIAL/PLATELET
Basophils Absolute: 0.1 10*3/uL (ref 0.0–0.2)
Basos: 1 %
EOS (ABSOLUTE): 0.2 10*3/uL (ref 0.0–0.4)
Eos: 2 %
Hematocrit: 43.8 % (ref 37.5–51.0)
Hemoglobin: 14.1 g/dL (ref 13.0–17.7)
Immature Grans (Abs): 0 10*3/uL (ref 0.0–0.1)
Immature Granulocytes: 0 %
Lymphocytes Absolute: 2.4 10*3/uL (ref 0.7–3.1)
Lymphs: 38 %
MCH: 26 pg — ABNORMAL LOW (ref 26.6–33.0)
MCHC: 32.2 g/dL (ref 31.5–35.7)
MCV: 81 fL (ref 79–97)
Monocytes Absolute: 0.4 10*3/uL (ref 0.1–0.9)
Monocytes: 6 %
Neutrophils Absolute: 3.2 10*3/uL (ref 1.4–7.0)
Neutrophils: 53 %
Platelets: 342 10*3/uL (ref 150–450)
RBC: 5.43 x10E6/uL (ref 4.14–5.80)
RDW: 13.5 % (ref 11.6–15.4)
WBC: 6.2 10*3/uL (ref 3.4–10.8)

## 2021-06-30 LAB — PSA TOTAL+% FREE (SERIAL)
PSA, Free Pct: 24.8 %
PSA, Free: 0.72 ng/mL
Prostate Specific Ag, Serum: 2.9 ng/mL (ref 0.0–4.0)

## 2021-06-30 LAB — LIPID PANEL
Chol/HDL Ratio: 8.2 ratio — ABNORMAL HIGH (ref 0.0–5.0)
Cholesterol, Total: 197 mg/dL (ref 100–199)
HDL: 24 mg/dL — ABNORMAL LOW (ref >39)
LDL Chol Calc (NIH): 134 mg/dL — ABNORMAL HIGH (ref 0–99)
Triglycerides: 213 mg/dL — ABNORMAL HIGH (ref 0–149)
VLDL Cholesterol Cal: 39 mg/dL (ref 5–40)

## 2021-06-30 LAB — THYROID PANEL WITH TSH
Free Thyroxine Index: 2.2 (ref 1.2–4.9)
T3 Uptake Ratio: 28 % (ref 24–39)
T4, Total: 7.8 ug/dL (ref 4.5–12.0)
TSH: 1.45 u[IU]/mL (ref 0.450–4.500)

## 2021-06-30 NOTE — Progress Notes (Signed)
Hi Johnny Rios, I have good news ! Your TG are better ! Almost halved since last time, just wanted to let you know the LDL has increased - not too high, still need to lay off of high fat diet, need low fat and high fiber diet.  Will see you soon!  Have a great day

## 2021-07-06 ENCOUNTER — Ambulatory Visit (INDEPENDENT_AMBULATORY_CARE_PROVIDER_SITE_OTHER): Payer: 59 | Admitting: Internal Medicine

## 2021-07-06 ENCOUNTER — Other Ambulatory Visit: Payer: Self-pay

## 2021-07-06 ENCOUNTER — Encounter: Payer: Self-pay | Admitting: Internal Medicine

## 2021-07-06 VITALS — BP 117/83 | HR 85 | Temp 98.9°F | Ht 69.49 in | Wt 260.2 lb

## 2021-07-06 DIAGNOSIS — Z1329 Encounter for screening for other suspected endocrine disorder: Secondary | ICD-10-CM

## 2021-07-06 DIAGNOSIS — E782 Mixed hyperlipidemia: Secondary | ICD-10-CM | POA: Diagnosis not present

## 2021-07-06 DIAGNOSIS — E119 Type 2 diabetes mellitus without complications: Secondary | ICD-10-CM

## 2021-07-06 DIAGNOSIS — K279 Peptic ulcer, site unspecified, unspecified as acute or chronic, without hemorrhage or perforation: Secondary | ICD-10-CM

## 2021-07-06 NOTE — Progress Notes (Signed)
BP 117/83   Pulse 85   Temp 98.9 F (37.2 C) (Oral)   Ht 5' 9.49" (1.765 m)   Wt 260 lb 3.2 oz (118 kg)   SpO2 98%   BMI 37.89 kg/m    Subjective:    [name], male    DOB: Mar 25, 1966, 55 y.o.   MRN: [id number]  Chief Complaint  Patient presents with  . Hyperlipidemia    HPI: Johnny Rios is a 55 y.o. male  A1c - 6.4 new onset  Hyperlipidemia This is a chronic problem. The problem is controlled. The current treatment provides mild improvement of lipids. Compliance problems include adherence to diet.    Chief Complaint  Patient presents with  . Hyperlipidemia    Relevant past medical, surgical, family and social history reviewed and updated as indicated. Interim medical history since our last visit reviewed. Allergies and medications reviewed and updated.  Review of Systems  Per HPI unless specifically indicated above     Objective:    BP 117/83   Pulse 85   Temp 98.9 F (37.2 C) (Oral)   Ht 5' 9.49" (1.765 m)   Wt 260 lb 3.2 oz (118 kg)   SpO2 98%   BMI 37.89 kg/m   Wt Readings from Last 3 Encounters:  07/06/21 260 lb 3.2 oz (118 kg)  06/23/21 264 lb 9.6 oz (120 kg)  06/14/21 267 lb (121.1 kg)    Physical Exam Vitals and nursing note reviewed.  Constitutional:      General: He is not in acute distress.    Appearance: Normal appearance. He is not ill-appearing or diaphoretic.  HENT:     Head: Normocephalic and atraumatic.     Right Ear: Tympanic membrane and external ear normal. There is no impacted cerumen.     Left Ear: External ear normal.     Nose: No congestion or rhinorrhea.     Mouth/Throat:     Pharynx: No oropharyngeal exudate or posterior oropharyngeal erythema.  Eyes:     Conjunctiva/sclera: Conjunctivae normal.     Pupils: Pupils are equal, round, and reactive to light.  Cardiovascular:     Rate and Rhythm: Normal rate and regular rhythm.     Heart sounds: No murmur heard.   No friction rub. No gallop.   Pulmonary:     Effort: No respiratory distress.     Breath sounds: No stridor. No wheezing or rhonchi.  Chest:     Chest wall: No tenderness.  Abdominal:     General: Abdomen is flat. Bowel sounds are normal.     Palpations: Abdomen is soft. There is no mass.     Tenderness: There is no abdominal tenderness.  Musculoskeletal:     Cervical back: Normal range of motion and neck supple. No rigidity or tenderness.     Left lower leg: No edema.  Skin:    General: Skin is dry.  Neurological:     Mental Status: He is alert.    Results for orders placed or performed in visit on 06/29/21  Bayer DCA Hb A1c Waived  Result Value Ref Range   HB A1C (BAYER DCA - WAIVED) 6.4 <7.0 %  Lipid panel  Result Value Ref Range   Cholesterol, Total 197 100 - 199 mg/dL   Triglycerides 213 (H) 0 - 149 mg/dL   HDL 24 (L) >39 mg/dL   VLDL Cholesterol Cal 39 5 - 40 mg/dL   LDL Chol Calc (NIH) 134 (H)  0 - 99 mg/dL   Chol/HDL Ratio 8.2 (H) 0.0 - 5.0 ratio  Thyroid Panel With TSH  Result Value Ref Range   TSH 1.450 0.450 - 4.500 uIU/mL   T4, Total 7.8 4.5 - 12.0 ug/dL   T3 Uptake Ratio 28 24 - 39 %   Free Thyroxine Index 2.2 1.2 - 4.9  CBC with Differential/Platelet  Result Value Ref Range   WBC 6.2 3.4 - 10.8 x10E3/uL   RBC 5.43 4.14 - 5.80 x10E6/uL   Hemoglobin 14.1 13.0 - 17.7 g/dL   Hematocrit 43.8 37.5 - 51.0 %   MCV 81 79 - 97 fL   MCH 26.0 (L) 26.6 - 33.0 pg   MCHC 32.2 31.5 - 35.7 g/dL   RDW 13.5 11.6 - 15.4 %   Platelets 342 150 - 450 x10E3/uL   Neutrophils 53 Not Estab. %   Lymphs 38 Not Estab. %   Monocytes 6 Not Estab. %   Eos 2 Not Estab. %   Basos 1 Not Estab. %   Neutrophils Absolute 3.2 1.4 - 7.0 x10E3/uL   Lymphocytes Absolute 2.4 0.7 - 3.1 x10E3/uL   Monocytes Absolute 0.4 0.1 - 0.9 x10E3/uL   EOS (ABSOLUTE) 0.2 0.0 - 0.4 x10E3/uL   Basophils Absolute 0.1 0.0 - 0.2 x10E3/uL   Immature Granulocytes 0 Not Estab. %   Immature Grans (Abs) 0.0 0.0 - 0.1 x10E3/uL   Comprehensive metabolic panel  Result Value Ref Range   Glucose 104 (H) 65 - 99 mg/dL   BUN 9 6 - 24 mg/dL   Creatinine, Ser 1.12 0.76 - 1.27 mg/dL   eGFR 79 >59 mL/min/1.73   BUN/Creatinine Ratio 8 (L) 9 - 20   Sodium 137 134 - 144 mmol/L   Potassium 4.2 3.5 - 5.2 mmol/L   Chloride 101 96 - 106 mmol/L   CO2 19 (L) 20 - 29 mmol/L   Calcium 9.6 8.7 - 10.2 mg/dL   Total Protein 7.3 6.0 - 8.5 g/dL   Albumin 4.7 3.8 - 4.9 g/dL   Globulin, Total 2.6 1.5 - 4.5 g/dL   Albumin/Globulin Ratio 1.8 1.2 - 2.2   Bilirubin Total 0.3 0.0 - 1.2 mg/dL   Alkaline Phosphatase 76 44 - 121 IU/L   AST 25 0 - 40 IU/L   ALT 41 0 - 44 IU/L  PSA Total+%Free (Serial)  Result Value Ref Range   Prostate Specific Ag, Serum 2.9 0.0 - 4.0 ng/mL   PSA, Free 0.72 N/A ng/mL   PSA, Free Pct 24.8 %        Current Outpatient Medications:  .  clonazePAM (KLONOPIN) 1 MG tablet, TAKE 1 TABLET BY MOUTH DAILY AS NEEDED, Disp: 30 tablet, Rfl: 1 .  esomeprazole (NEXIUM) 20 MG capsule, Take 1 capsule (20 mg total) by mouth every morning., Disp: 90 capsule, Rfl: 1 .  fenofibrate 160 MG tablet, Take 1 tablet (160 mg total) by mouth daily., Disp: 30 tablet, Rfl: 6 .  ondansetron (ZOFRAN) 8 MG tablet, Take 1 tablet (8 mg total) by mouth every 8 (eight) hours as needed for nausea or vomiting., Disp: 20 tablet, Rfl: 0    Assessment & Plan:   Obesity down to 260 from 270 lbs in 1 month    Diet plan given to pt   exercise plan given and encouraged.  To increase exercise to 150 mins a week ie 21/2 hours a week. Pt verbalises understanding of the above.   2. Hld: recheck FLP, check LFT's work on  diet, SE of meds explained to pt. low fat and high fiber diet explained to pt.  3. Prediabetes:A1c at 6.4  adviced regarding given to pt today on how to prevent and treat the same if it were to occur. pt acknowledges the plan and voices understanding of the same.  exercise plan given and encouraged.   advice diabetic yearly podiatry,  ophthalmology , nutritionist , dental check q 6 months,    Problem List Items Addressed This Visit   None   Orders Placed This Encounter  Procedures  . Comprehensive metabolic panel  . CBC with Differential/Platelet  . Bayer DCA Hb A1c Waived  . Lipid panel      No orders of the defined types were placed in this encounter.    Follow up plan: Return in about 3 months (around 10/06/2021).  CBC, CMP, FLP, HBA1C, abs 1 week prior to next visit.

## 2021-07-06 NOTE — Patient Instructions (Addendum)
Results for Johnny Rios, Johnny Rios (MRN [id number]) as of 07/06/2021 15:56  Ref. Range 06/29/2021 09:03  COMPREHENSIVE METABOLIC PANEL Unknown Rpt (A)  Sodium Latest Ref Range: 134 - 144 mmol/L 137  Potassium Latest Ref Range: 3.5 - 5.2 mmol/L 4.2  Chloride Latest Ref Range: 96 - 106 mmol/L 101  CO2 Latest Ref Range: 20 - 29 mmol/L 19 (L)  Glucose Latest Ref Range: 65 - 99 mg/dL 104 (H)  BUN Latest Ref Range: 6 - 24 mg/dL 9  Creatinine Latest Ref Range: 0.76 - 1.27 mg/dL 1.12  Calcium Latest Ref Range: 8.7 - 10.2 mg/dL 9.6  BUN/Creatinine Ratio Latest Ref Range: 9 - 20  8 (L)  eGFR Latest Ref Range: >59 mL/min/1.73 79  Alkaline Phosphatase Latest Ref Range: 44 - 121 IU/L 76  Albumin Latest Ref Range: 3.8 - 4.9 g/dL 4.7  Albumin/Globulin Ratio Latest Ref Range: 1.2 - 2.2  1.8  AST Latest Ref Range: 0 - 40 IU/L 25  ALT Latest Ref Range: 0 - 44 IU/L 41  Total Protein Latest Ref Range: 6.0 - 8.5 g/dL 7.3  Total Bilirubin Latest Ref Range: 0.0 - 1.2 mg/dL 0.3  Total CHOL/HDL Ratio Latest Ref Range: 0.0 - 5.0 ratio 8.2 (H)  Cholesterol, Total Latest Ref Range: 100 - 199 mg/dL 197  HDL Cholesterol Latest Ref Range: >39 mg/dL 24 (L)  Triglycerides Latest Ref Range: 0 - 149 mg/dL 213 (H)  VLDL Cholesterol Cal Latest Ref Range: 5 - 40 mg/dL 39  LDL Chol Calc (NIH) Latest Ref Range: 0 - 99 mg/dL 134 (H)  Globulin, Total Latest Ref Range: 1.5 - 4.5 g/dL 2.6  WBC Latest Ref Range: 3.4 - 10.8 x10E3/uL 6.2  RBC Latest Ref Range: 4.14 - 5.80 x10E6/uL 5.43  Hemoglobin Latest Ref Range: 13.0 - 17.7 g/dL 14.1  HCT Latest Ref Range: 37.5 - 51.0 % 43.8  MCV Latest Ref Range: 79 - 97 fL 81  MCH Latest Ref Range: 26.6 - 33.0 pg 26.0 (L)  MCHC Latest Ref Range: 31.5 - 35.7 g/dL 32.2  RDW Latest Ref Range: 11.6 - 15.4 % 13.5  Platelets Latest Ref Range: 150 - 450 x10E3/uL 342  Neutrophils Latest Ref Range: Not Estab. % 53  Immature Granulocytes Latest Ref Range: Not Estab. % 0  NEUT# Latest Ref Range: 1.4 -  7.0 x10E3/uL 3.2  Lymphocyte # Latest Ref Range: 0.7 - 3.1 x10E3/uL 2.4  Monocytes Absolute Latest Ref Range: 0.1 - 0.9 x10E3/uL 0.4  Basophils Absolute Latest Ref Range: 0.0 - 0.2 x10E3/uL 0.1  Immature Grans (Abs) Latest Ref Range: 0.0 - 0.1 x10E3/uL 0.0  Lymphs Latest Ref Range: Not Estab. % 38  Monocytes Latest Ref Range: Not Estab. % 6  Basos Latest Ref Range: Not Estab. % 1  Eos Latest Ref Range: Not Estab. % 2  EOS (ABSOLUTE) Latest Ref Range: 0.0 - 0.4 x10E3/uL 0.2  TSH Latest Ref Range: 0.450 - 4.500 uIU/mL 1.450  Thyroxine (T4) Latest Ref Range: 4.5 - 12.0 ug/dL 7.8  Free Thyroxine Index Latest Ref Range: 1.2 - 4.9  2.2  T3 Uptake Ratio Latest Ref Range: 24 - 39 % 28  Prostate Specific Ag, Serum Latest Ref Range: 0.0 - 4.0 ng/mL 2.9  PSA, Free Latest Ref Range: N/A ng/mL 0.72  PSA, Free Pct Latest Units: % 24.8    Pasta - chickpea pasta instead of regular     Prediabetes Eating Plan Prediabetes is a condition that causes blood sugar (glucose) levels to be higher than  normal. This increases the risk for developing type 2 diabetes (type 2 diabetes mellitus). Working with a health care provider or nutrition specialist (dietitian) to make diet and lifestyle changes can help prevent the onset of diabetes. These changes may help you: Control your blood glucose levels. Improve your cholesterol levels. Manage your blood pressure. What are tips for following this plan? Reading food labels Read food labels to check the amount of fat, salt (sodium), and sugar in prepackaged foods. Avoid foods that have: Saturated fats. Trans fats. Added sugars. Avoid foods that have more than 300 milligrams (mg) of sodium per serving. Limit your sodium intake to less than 2,300 mg each day. Shopping Avoid buying pre-made and processed foods. Avoid buying drinks with added sugar. Cooking Cook with olive oil. Do not use butter, lard, or ghee. Bake, broil, grill, steam, or boil foods. Avoid  frying. Meal planning  Work with your dietitian to create an eating plan that is right for you. This may include tracking how many calories you take in each day. Use a food diary, notebook, or mobile application to track what you eat at each meal. Consider following a Mediterranean diet. This includes: Eating several servings of fresh fruits and vegetables each day. Eating fish at least twice a week. Eating one serving each day of whole grains, beans, nuts, and seeds. Using olive oil instead of other fats. Limiting alcohol. Limiting red meat. Using nonfat or low-fat dairy products. Consider following a plant-based diet. This includes dietary choices that focus on eating mostly vegetables and fruit, grains, beans, nuts, and seeds. If you have high blood pressure, you may need to limit your sodium intake or follow a diet such as the DASH (Dietary Approaches to Stop Hypertension) eating plan. The DASH diet aims to lower high blood pressure.  Lifestyle Set weight loss goals with help from your health care team. It is recommended that most people with prediabetes lose 7% of their body weight. Exercise for at least 30 minutes 5 or more days a week. Attend a support group or seek support from a mental health counselor. Take over-the-counter and prescription medicines only as told by your health care provider. What foods are recommended? Fruits Berries. Bananas. Apples. Oranges. Grapes. Papaya. Mango. Pomegranate. Kiwi.Grapefruit. Cherries. Vegetables Lettuce. Spinach. Peas. Beets. Cauliflower. Cabbage. Broccoli. Carrots.Tomatoes. Squash. Eggplant. Herbs. Peppers. Onions. Cucumbers. Brussels sprouts. Grains Whole grains, such as whole-wheat or whole-grain breads, crackers, cereals, and pasta. Unsweetened oatmeal. Bulgur. Barley. Quinoa. Brown rice. Corn orwhole-wheat flour tortillas or taco shells. Meats and other proteins Seafood. Poultry without skin. Lean cuts of pork and beef. Tofu. Eggs.  Nuts.Beans. Dairy Low-fat or fat-free dairy products, such as yogurt, cottage cheese, and cheese. Beverages Water. Tea. Coffee. Sugar-free or diet soda. Seltzer water. Low-fat or nonfatmilk. Milk alternatives, such as soy or almond milk. Fats and oils Olive oil. Canola oil. Sunflower oil. Grapeseed oil. Avocado. Walnuts. Sweets and desserts Sugar-free or low-fat pudding. Sugar-free or low-fat ice cream and other frozentreats. Seasonings and condiments Herbs. Sodium-free spices. Mustard. Relish. Low-salt, low-sugar ketchup.Low-salt, low-sugar barbecue sauce. Low-fat or fat-free mayonnaise. The items listed above may not be a complete list of recommended foods and beverages. Contact a dietitian for more information. What foods are not recommended? Fruits Fruits canned with syrup. Vegetables Canned vegetables. Frozen vegetables with butter or cream sauce. Grains Refined white flour and flour products, such as bread, pasta, snack foods, andcereals. Meats and other proteins Fatty cuts of meat. Poultry with skin. Breaded or fried meat.  Processed meats. Dairy Full-fat yogurt, cheese, or milk. Beverages Sweetened drinks, such as iced tea and soda. Fats and oils Butter. Lard. Ghee. Sweets and desserts Baked goods, such as cake, cupcakes, pastries, cookies, and cheesecake. Seasonings and condiments Spice mixes with added salt. Ketchup. Barbecue sauce. Mayonnaise. The items listed above may not be a complete list of foods and beverages that are not recommended. Contact a dietitian for more information. Where to find more information American Diabetes Association: www.diabetes.org Summary You may need to make diet and lifestyle changes to help prevent the onset of diabetes. These changes can help you control blood sugar, improve cholesterol levels, and manage blood pressure. Set weight loss goals with help from your health care team. It is recommended that most people with prediabetes lose 7%  of their body weight. Consider following a Mediterranean diet. This includes eating plenty of fresh fruits and vegetables, whole grains, beans, nuts, seeds, fish, and low-fat dairy, and using olive oil instead of other fats. This information is not intended to replace advice given to you by your health care provider. Make sure you discuss any questions you have with your healthcare provider. Pasta - chickpea pasta instead of regular

## 2021-07-08 ENCOUNTER — Ambulatory Visit: Payer: Self-pay | Admitting: General Surgery

## 2021-08-02 ENCOUNTER — Telehealth: Payer: Self-pay

## 2021-08-02 ENCOUNTER — Other Ambulatory Visit: Payer: Self-pay

## 2021-08-02 DIAGNOSIS — F419 Anxiety disorder, unspecified: Secondary | ICD-10-CM

## 2021-08-02 MED ORDER — CLONAZEPAM 1 MG PO TABS: ORAL_TABLET | ORAL | 1 refills | Status: DC

## 2021-08-02 NOTE — Telephone Encounter (Signed)
Called patient and informed him that his prescription for Klonopin  is at the front desk waiting on him to pick it up. Patient verbalized understanding

## 2021-08-02 NOTE — Telephone Encounter (Signed)
Patient would like to get a refill of his Klonopin he was last seen on 07/06/21.    Patient was last seen on 07/06/21, Up coming appointment is 105/22

## 2021-08-09 ENCOUNTER — Telehealth: Payer: Self-pay

## 2021-08-09 NOTE — Telephone Encounter (Signed)
PA for Esomeprazole  '20mg'$  has been denied

## 2021-08-27 ENCOUNTER — Telehealth: Payer: Self-pay

## 2021-08-27 DIAGNOSIS — Z1211 Encounter for screening for malignant neoplasm of colon: Secondary | ICD-10-CM

## 2021-08-27 NOTE — Telephone Encounter (Signed)
Copied from [place] [phone] [id number]. Topic: General - Other >> Aug 27, 2021  9:45 AM Alanda Slim E wrote: Reason for CRM: Pt called and needs Orders / appt for a colonoscopy / Pt stated orders ave been placed before but he wasn't able to go/ he would like to have it done around the end of the month on Sept 28th or 29th / please advise   Order for colonscopy pended. Routing to provider to sign.

## 2021-08-31 ENCOUNTER — Telehealth: Payer: Self-pay | Admitting: Internal Medicine

## 2021-08-31 ENCOUNTER — Other Ambulatory Visit (INDEPENDENT_AMBULATORY_CARE_PROVIDER_SITE_OTHER): Payer: Self-pay

## 2021-08-31 DIAGNOSIS — Z1211 Encounter for screening for malignant neoplasm of colon: Secondary | ICD-10-CM

## 2021-08-31 MED ORDER — PEG 3350-KCL-NA BICARB-NACL 420 G PO SOLR: 4000.0000 mL | Freq: Once | ORAL | 0 refills | Status: AC

## 2021-08-31 NOTE — Telephone Encounter (Signed)
He should have a refill at his pharmacy.

## 2021-08-31 NOTE — Telephone Encounter (Signed)
Routing to provider to advise.  

## 2021-08-31 NOTE — Telephone Encounter (Signed)
Patient currently has a script for clonazePAM (KLONOPIN) 1 MG tablet, patient can fill in 2 days and would like PCP to send in a 2 day supply to help him cope.   Patient states PCP only sends in a 30 day supply when there's more then 30 days in a month.   [place], [place] - [place] Phone:  [phone]  Fax:  [phone]

## 2021-08-31 NOTE — Progress Notes (Signed)
Gastroenterology Pre-Procedure Review  Request Date: 10/22/21 Requesting Physician: Dr. Bonna Gains  PATIENT REVIEW QUESTIONS: The patient responded to the following health history questions as indicated:    1. Are you having any GI issues? no 2. Do you have a personal history of Polyps? no 3. Do you have a family history of Colon Cancer or Polyps? no 4. Diabetes Mellitus? no 5. Joint replacements in the past 12 months?no 6. Major health problems in the past 3 months?no 7. Any artificial heart valves, MVP, or defibrillator?no    MEDICATIONS & ALLERGIES:    Patient reports the following regarding taking any anticoagulation/antiplatelet therapy:   Plavix, Coumadin, Eliquis, Xarelto, Lovenox, Pradaxa, Brilinta, or Effient? no Aspirin? no  Patient confirms/reports the following medications:  Current Outpatient Medications  Medication Sig Dispense Refill   clonazePAM (KLONOPIN) 1 MG tablet TAKE 1 TABLET BY MOUTH DAILY AS NEEDED 30 tablet 1   esomeprazole (NEXIUM) 20 MG capsule Take 1 capsule (20 mg total) by mouth every morning. 90 capsule 1   fenofibrate 160 MG tablet Take 1 tablet (160 mg total) by mouth daily. 30 tablet 6   ondansetron (ZOFRAN) 8 MG tablet Take 1 tablet (8 mg total) by mouth every 8 (eight) hours as needed for nausea or vomiting. 20 tablet 0   No current facility-administered medications for this visit.    Patient confirms/reports the following allergies:  No Known Allergies  No orders of the defined types were placed in this encounter.   AUTHORIZATION INFORMATION Primary Insurance: 1D#: Group #:  Secondary Insurance: 1D#: Group #:  SCHEDULE INFORMATION: Date: 10/22/21 Time: Location: [facility]

## 2021-09-01 NOTE — Telephone Encounter (Signed)
Spoke with patient and was informed that he was able to pick up his prescription from the pharmacy. Advised patient to give our office a call back if he has any other questions or concerns. Patient verbalized understanding.

## 2021-09-21 ENCOUNTER — Encounter: Payer: Self-pay | Admitting: Gastroenterology

## 2021-09-22 ENCOUNTER — Ambulatory Visit
Admission: RE | Admit: 2021-09-22 | Discharge: 2021-09-22 | Disposition: A | Discharge status: Home / Self Care | Payer: 59 | Attending: Gastroenterology | Admitting: Gastroenterology

## 2021-09-22 ENCOUNTER — Encounter: Payer: Self-pay | Admitting: Gastroenterology

## 2021-09-22 ENCOUNTER — Encounter
Admission: RE | Disposition: A | Discharge status: Home / Self Care | Payer: Self-pay | Source: Home / Self Care | Attending: Gastroenterology

## 2021-09-22 ENCOUNTER — Ambulatory Visit: Payer: 59 | Admitting: Certified Registered Nurse Anesthetist

## 2021-09-22 DIAGNOSIS — K5730 Diverticulosis of large intestine without perforation or abscess without bleeding: Secondary | ICD-10-CM | POA: Diagnosis not present

## 2021-09-22 DIAGNOSIS — Z801 Family history of malignant neoplasm of trachea, bronchus and lung: Secondary | ICD-10-CM | POA: Diagnosis not present

## 2021-09-22 DIAGNOSIS — D124 Benign neoplasm of descending colon: Secondary | ICD-10-CM | POA: Insufficient documentation

## 2021-09-22 DIAGNOSIS — Z79899 Other long term (current) drug therapy: Secondary | ICD-10-CM | POA: Diagnosis not present

## 2021-09-22 DIAGNOSIS — Z9049 Acquired absence of other specified parts of digestive tract: Secondary | ICD-10-CM | POA: Insufficient documentation

## 2021-09-22 DIAGNOSIS — Z87891 Personal history of nicotine dependence: Secondary | ICD-10-CM | POA: Insufficient documentation

## 2021-09-22 DIAGNOSIS — Z1211 Encounter for screening for malignant neoplasm of colon: Secondary | ICD-10-CM | POA: Diagnosis present

## 2021-09-22 DIAGNOSIS — K219 Gastro-esophageal reflux disease without esophagitis: Secondary | ICD-10-CM | POA: Insufficient documentation

## 2021-09-22 DIAGNOSIS — D175 Benign lipomatous neoplasm of intra-abdominal organs: Secondary | ICD-10-CM | POA: Diagnosis not present

## 2021-09-22 DIAGNOSIS — K635 Polyp of colon: Secondary | ICD-10-CM | POA: Diagnosis not present

## 2021-09-22 DIAGNOSIS — Z8249 Family history of ischemic heart disease and other diseases of the circulatory system: Secondary | ICD-10-CM | POA: Diagnosis not present

## 2021-09-22 DIAGNOSIS — G4730 Sleep apnea, unspecified: Secondary | ICD-10-CM | POA: Insufficient documentation

## 2021-09-22 HISTORY — PX: COLONOSCOPY WITH PROPOFOL: SHX5780

## 2021-09-22 SURGERY — COLONOSCOPY WITH PROPOFOL
Anesthesia: General

## 2021-09-22 MED ORDER — PROPOFOL 10 MG/ML IV BOLUS
INTRAVENOUS | Status: DC | PRN
  Administered 2021-09-22: 60 mg via INTRAVENOUS

## 2021-09-22 MED ORDER — PHENYLEPHRINE HCL (PRESSORS) 10 MG/ML IV SOLN
INTRAVENOUS | Status: DC | PRN
  Administered 2021-09-22 (×3): 100 ug via INTRAVENOUS

## 2021-09-22 MED ORDER — LIDOCAINE HCL (CARDIAC) PF 100 MG/5ML IV SOSY
PREFILLED_SYRINGE | INTRAVENOUS | Status: DC | PRN
  Administered 2021-09-22: 50 mg via INTRAVENOUS

## 2021-09-22 MED ORDER — PROPOFOL 500 MG/50ML IV EMUL
INTRAVENOUS | Status: DC | PRN
  Administered 2021-09-22: 150 ug/kg/min via INTRAVENOUS

## 2021-09-22 MED ORDER — SODIUM CHLORIDE 0.9 % IV SOLN: INTRAVENOUS | Status: DC

## 2021-09-22 MED ORDER — PROPOFOL 10 MG/ML IV BOLUS
INTRAVENOUS | Status: AC
  Filled 2021-09-22: qty 20

## 2021-09-22 MED ORDER — PROPOFOL 500 MG/50ML IV EMUL
INTRAVENOUS | Status: AC
  Filled 2021-09-22: qty 50

## 2021-09-22 NOTE — Anesthesia Postprocedure Evaluation (Signed)
Anesthesia Post Note  Patient: Johnny Rios  Procedure(s) Performed: COLONOSCOPY WITH PROPOFOL  Patient location during evaluation: Endoscopy Anesthesia Type: General Level of consciousness: awake and alert and oriented Pain management: pain level controlled Vital Signs Assessment: post-procedure vital signs reviewed and stable Respiratory status: spontaneous breathing, nonlabored ventilation and respiratory function stable Cardiovascular status: blood pressure returned to baseline and stable Postop Assessment: no signs of nausea or vomiting Anesthetic complications: no   No notable events documented.   Last Vitals:  Vitals:   09/22/21 1206 09/22/21 1216  BP: 106/72 105/78  Pulse: 84 77  Resp: 16 18  Temp:    SpO2: 100% 99%    Last Pain:  Vitals:   09/22/21 1216  TempSrc:   PainSc: 0-No pain                 Kiersten Coss

## 2021-09-22 NOTE — Op Note (Signed)
[facility] Gastroenterology Patient Name: Johnny Rios Procedure Date: 09/22/2021 11:06 AM MRN: [id number] Account #: [id number] Date of Birth: [birth year] Admit Type: Outpatient Age: 55 Room: [facility] ENDO ROOM 3 Gender: Male Note Status: Finalized Instrument Name: Park Meo [id number] Procedure:             Colonoscopy Indications:           Screening for colorectal malignant neoplasm Providers:             Krystine Pabst B. Bonna Gains MD, MD Medicines:             Monitored Anesthesia Care Complications:         No immediate complications. Procedure:             Pre-Anesthesia Assessment:                        - ASA Grade Assessment: II - A patient with mild                         systemic disease.                        - Prior to the procedure, a History and Physical was                         performed, and patient medications, allergies and                         sensitivities were reviewed. The patient's tolerance                         of previous anesthesia was reviewed.                        - The risks and benefits of the procedure and the                         sedation options and risks were discussed with the                         patient. All questions were answered and informed                         consent was obtained.                        - Patient identification and proposed procedure were                         verified prior to the procedure by the physician, the                         nurse, the anesthesiologist, the anesthetist and the                         technician. The procedure was verified in the                         procedure room.  After obtaining informed consent, the colonoscope was                         passed under direct vision. Throughout the procedure,                         the patient's blood pressure, pulse, and oxygen                         saturations were monitored continuously. The                          Colonoscope was introduced through the anus and                         advanced to the the cecum, identified by appendiceal                         orifice and ileocecal valve. The colonoscopy was                         performed with ease. The patient tolerated the                         procedure well. The quality of the bowel preparation                         was fair. Findings:      The perianal and digital rectal examinations were normal.      There was a small lipoma, at the hepatic flexure. Biopsies were taken       with a cold forceps for histology. Bright yellow material seen on tunnel       biopsies suggestive of lipoma. Pillow sign was positive.      A 10 mm polyp was found in the descending colon. The polyp was       semi-pedunculated. The polyp was removed with a cold snare. Resection       and retrieval were complete. For hemostasis, two hemostatic clips were       successfully placed. There was no bleeding at the end of the procedure.      A 5 mm polyp was found in the sigmoid colon. The polyp was sessile. The       polyp was removed with a cold biopsy forceps. Resection and retrieval       were complete.      Multiple diverticula were found in the sigmoid colon.      The exam was otherwise without abnormality.      The rectum, sigmoid colon, descending colon, transverse colon, ascending       colon and cecum appeared normal.      The retroflexed view of the distal rectum and anal verge was normal and       showed no anal or rectal abnormalities. Impression:            - Preparation of the colon was fair.                        - Small lipoma at the hepatic flexure. Biopsied.                        -  One 10 mm polyp in the descending colon, removed                         with a cold snare. Resected and retrieved. Clips were                         placed.                        - One 5 mm polyp in the sigmoid colon, removed with a                          cold biopsy forceps. Resected and retrieved.                        - Diverticulosis in the sigmoid colon.                        - The examination was otherwise normal.                        - The rectum, sigmoid colon, descending colon,                         transverse colon, ascending colon and cecum are normal.                        - The distal rectum and anal verge are normal on                         retroflexion view. Recommendation:        - Discharge patient to home (with escort).                        - Advance diet as tolerated.                        - Continue present medications.                        - Await pathology results.                        - Repeat colonoscopy date to be determined after                         pending pathology results are reviewed.                        - The findings and recommendations were discussed with                         the patient.                        - The findings and recommendations were discussed with                         the patient's family.                        -  Return to primary care physician as previously                         scheduled.                        - High fiber diet. Procedure Code(s):     --- Professional ---                        [phone], Colonoscopy, flexible; with removal of                         tumor(s), polyp(s), or other lesion(s) by snare                         technique                        45380, 46, Colonoscopy, flexible; with biopsy, single                         or multiple Diagnosis Code(s):     --- Professional ---                        Z12.11, Encounter for screening for malignant neoplasm                         of colon                        D17.5, Benign lipomatous neoplasm of intra-abdominal                         organs                        K63.5, Polyp of colon CPT copyright 2019 American Medical Association. All rights reserved. The codes  documented in this report are preliminary and upon coder review may  be revised to meet current compliance requirements.  Vonda Antigua, MD Margretta Sidle B. Bonna Gains MD, MD 09/22/2021 11:46:20 AM This report has been signed electronically. Number of Addenda: 0 Note Initiated On: 09/22/2021 11:06 AM Scope Withdrawal Time: 0 hours 20 minutes 29 seconds  Total Procedure Duration: 0 hours 22 minutes 56 seconds  Estimated Blood Loss:  Estimated blood loss: none.      [facility]

## 2021-09-22 NOTE — H&P (Signed)
Vonda Antigua, MD [address], [place], [place], [place], [address] [address], [place], [place], [place], [address] Phone: [phone]  Fax: [phone]  Primary Care Physician:  Charlynne Cousins, MD   Pre-Procedure History & Physical: HPI:  Johnny Rios is a 55 y.o. male is here for a colonoscopy.   Past Medical History:  Diagnosis Date   Anxiety    Arthritis    GERD (gastroesophageal reflux disease)    Sleep apnea    Uses CPAP   Tachycardia     Past Surgical History:  Procedure Laterality Date   APPENDECTOMY     CHOLECYSTECTOMY N/A 01/19/2017   Procedure: LAPAROSCOPIC CHOLECYSTECTOMY WITH INTRAOPERATIVE CHOLANGIOGRAM;  Surgeon: Robert Bellow, MD;  Location: [facility] ORS;  Service: General;  Laterality: N/A;   LACERATION REPAIR Left 04/04/2019   Procedure: Repair Multiple Lacerations;  Surgeon: Leanora Cover, MD;  Location: [place];  Service: Orthopedics;  Laterality: Left;   MINOR NAILBED REPAIR Left 04/04/2019   Procedure: Minor Nailbed Repair;  Surgeon: Leanora Cover, MD;  Location: [place];  Service: Orthopedics;  Laterality: Left;   NERVE AND TENDON REPAIR Left 04/04/2019   Procedure: IRRIGATION AND DEBRIDEMENT OF FIXATION OF OPEN FRACTURES LONG ,RING FINGER;  Surgeon: Leanora Cover, MD;  Location: [place];  Service: Orthopedics;  Laterality: Left;   TONSILLECTOMY     UMBILICAL HERNIA REPAIR N/A 06/12/2017   Ventral hernia with 6 cm Ventralex ST mesh.   VENTRAL HERNIA REPAIR N/A 07/11/2018   15 x 20 cm intraperitoneal Ventralex mesh  ;  Surgeon: Robert Bellow, MD;  Location: [facility] ORS;  Service: General;  Laterality: N/A;    Prior to Admission medications   Medication Sig Start Date End Date Taking? Authorizing Provider  clonazePAM (KLONOPIN) 1 MG tablet TAKE 1 TABLET BY MOUTH DAILY AS NEEDED 08/02/21  Yes Vigg, Avanti, MD  esomeprazole (NEXIUM) 20 MG capsule Take 1 capsule (20 mg total) by mouth every morning. 06/07/21  Yes Vigg, Avanti, MD  fenofibrate 160 MG tablet  Take 1 tablet (160 mg total) by mouth daily. 06/07/21   Vigg, Avanti, MD  ondansetron (ZOFRAN) 8 MG tablet Take 1 tablet (8 mg total) by mouth every 8 (eight) hours as needed for nausea or vomiting. 06/23/21   Charlynne Cousins, MD    Allergies as of 08/31/2021   (No Known Allergies)    Family History  Problem Relation Age of Onset   Cancer Father        lung   Alzheimer's disease Mother    Heart attack Brother    Heart attack Brother     Social History   Socioeconomic History   Marital status: Single    Spouse name: Not on file   Number of children: 2   Years of education: HS   Highest education level: Not on file  Occupational History   Not on file  Tobacco Use   Smoking status: Never   Smokeless tobacco: Former    Types: Nurse, children's Use: Never used  Substance and Sexual Activity   Alcohol use: Yes    Comment: occasional   Drug use: No   Sexual activity: Not on file  Other Topics Concern   Not on file  Social History Narrative   Drinks 3-4 caffeine drinks a day    Social Determinants of Health   Financial Resource Strain: Not on file  Food Insecurity: Not on file  Transportation Needs: Not on file  Physical  Activity: Not on file  Stress: Not on file  Social Connections: Not on file  Intimate Partner Violence: Not on file    Review of Systems: See HPI, otherwise negative ROS  Physical Exam: Constitutional: General:   Alert,  Well-developed, well-nourished, pleasant and cooperative in NAD BP 113/90   Pulse 91   Temp (!) 97.2 F (36.2 C) (Temporal)   Resp 17   Ht 5\' 10"  (1.778 m)   Wt 112.5 kg   SpO2 100%   BMI 35.58 kg/m   Head: Normocephalic, atraumatic.   Eyes:  Sclera clear, no icterus.   Conjunctiva pink.   Mouth:  No deformity or lesions, oropharynx pink & moist.  Neck:  Supple, trachea midline  Respiratory: Normal respiratory effort  Gastrointestinal:  Soft, non-tender and non-distended without masses, hepatosplenomegaly or  hernias noted.  No guarding or rebound tenderness.     Cardiac: No clubbing or edema.  No cyanosis. Normal posterior tibial pedal pulses noted.  Lymphatic:  No significant cervical adenopathy.  Psych:  Alert and cooperative. Normal mood and affect.  Musculoskeletal:   Symmetrical without gross deformities. 5/5 Lower extremity strength bilaterally.  Skin: Warm. Intact without significant lesions or rashes. No jaundice.  Neurologic:  Face symmetrical, tongue midline, Normal sensation to touch;  grossly normal neurologically.  Psych:  Alert and oriented x3, Alert and cooperative. Normal mood and affect.  Impression/Plan: Johnny Rios is here for a colonoscopy to be performed for average risk screening.  Risks, benefits, limitations, and alternatives regarding  colonoscopy have been reviewed with the patient.  Questions have been answered.  All parties agreeable.   Virgel Manifold, MD  09/22/2021, 11:15 AM

## 2021-09-22 NOTE — Transfer of Care (Signed)
Immediate Anesthesia Transfer of Care Note  Patient: Johnny Rios  Procedure(s) Performed: COLONOSCOPY WITH PROPOFOL  Patient Location: PACU  Anesthesia Type:General  Level of Consciousness: awake, alert  and oriented  Airway & Oxygen Therapy: Patient Spontanous Breathing and Patient connected to nasal cannula oxygen  Post-op Assessment: Report given to RN and Post -op Vital signs reviewed and stable  Post vital signs: Reviewed and stable  Last Vitals:  Vitals Value Taken Time  BP    Temp    Pulse    Resp    SpO2      Last Pain:  Vitals:   09/22/21 1043  TempSrc: Temporal  PainSc: 0-No pain         Complications: No notable events documented.

## 2021-09-22 NOTE — Anesthesia Preprocedure Evaluation (Signed)
Anesthesia Evaluation  Patient identified by MRN, date of birth, ID band Patient awake    Reviewed: Allergy & Precautions, NPO status , Patient's Chart, lab work & pertinent test results  History of Anesthesia Complications Negative for: history of anesthetic complications  Airway Mallampati: II  TM Distance: >3 FB Neck ROM: Full    Dental no notable dental hx.    Pulmonary sleep apnea and Continuous Positive Airway Pressure Ventilation , neg COPD,    breath sounds clear to auscultation- rhonchi (-) wheezing      Cardiovascular Exercise Tolerance: Good (-) hypertension(-) CAD, (-) Past MI, (-) Cardiac Stents and (-) CABG  Rhythm:Regular Rate:Normal - Systolic murmurs and - Diastolic murmurs    Neuro/Psych neg Seizures Anxiety negative neurological ROS     GI/Hepatic Neg liver ROS, PUD, GERD  ,  Endo/Other  negative endocrine ROSneg diabetes  Renal/GU negative Renal ROS     Musculoskeletal  (+) Arthritis ,   Abdominal (+) + obese,   Peds  Hematology negative hematology ROS (+)   Anesthesia Other Findings Past Medical History: No date: Anxiety No date: Arthritis No date: GERD (gastroesophageal reflux disease) No date: Sleep apnea     Comment:  Uses CPAP No date: Tachycardia   Reproductive/Obstetrics                             Anesthesia Physical Anesthesia Plan  ASA: 2  Anesthesia Plan: General   Post-op Pain Management:    Induction: Intravenous  PONV Risk Score and Plan: 1 and Propofol infusion  Airway Management Planned: Natural Airway  Additional Equipment:   Intra-op Plan:   Post-operative Plan:   Informed Consent: I have reviewed the patients History and Physical, chart, labs and discussed the procedure including the risks, benefits and alternatives for the proposed anesthesia with the patient or authorized representative who has indicated his/her understanding and  acceptance.     Dental advisory given  Plan Discussed with: CRNA and Anesthesiologist  Anesthesia Plan Comments:         Anesthesia Quick Evaluation

## 2021-09-23 ENCOUNTER — Encounter: Payer: Self-pay | Admitting: Gastroenterology

## 2021-09-23 LAB — SURGICAL PATHOLOGY

## 2021-09-29 ENCOUNTER — Other Ambulatory Visit: Payer: Self-pay

## 2021-09-29 ENCOUNTER — Other Ambulatory Visit: Payer: 59

## 2021-09-29 DIAGNOSIS — K279 Peptic ulcer, site unspecified, unspecified as acute or chronic, without hemorrhage or perforation: Secondary | ICD-10-CM

## 2021-09-29 DIAGNOSIS — E119 Type 2 diabetes mellitus without complications: Secondary | ICD-10-CM

## 2021-09-29 DIAGNOSIS — E782 Mixed hyperlipidemia: Secondary | ICD-10-CM

## 2021-09-29 LAB — BAYER DCA HB A1C WAIVED: HB A1C (BAYER DCA - WAIVED): 5.9 % — ABNORMAL HIGH (ref 4.8–5.6)

## 2021-09-30 LAB — COMPREHENSIVE METABOLIC PANEL
ALT: 23 IU/L (ref 0–44)
AST: 19 IU/L (ref 0–40)
Albumin/Globulin Ratio: 1.8 (ref 1.2–2.2)
Albumin: 4.4 g/dL (ref 3.8–4.9)
Alkaline Phosphatase: 78 IU/L (ref 44–121)
BUN/Creatinine Ratio: 12 (ref 9–20)
BUN: 12 mg/dL (ref 6–24)
Bilirubin Total: 0.4 mg/dL (ref 0.0–1.2)
CO2: 22 mmol/L (ref 20–29)
Calcium: 9.8 mg/dL (ref 8.7–10.2)
Chloride: 102 mmol/L (ref 96–106)
Creatinine, Ser: 1.02 mg/dL (ref 0.76–1.27)
Globulin, Total: 2.5 g/dL (ref 1.5–4.5)
Glucose: 102 mg/dL — ABNORMAL HIGH (ref 70–99)
Potassium: 4.1 mmol/L (ref 3.5–5.2)
Sodium: 137 mmol/L (ref 134–144)
Total Protein: 6.9 g/dL (ref 6.0–8.5)
eGFR: 88 mL/min/{1.73_m2} (ref >59)

## 2021-09-30 LAB — LIPID PANEL
Chol/HDL Ratio: 6.2 ratio — ABNORMAL HIGH (ref 0.0–5.0)
Cholesterol, Total: 174 mg/dL (ref 100–199)
HDL: 28 mg/dL — ABNORMAL LOW (ref >39)
LDL Chol Calc (NIH): 123 mg/dL — ABNORMAL HIGH (ref 0–99)
Triglycerides: 125 mg/dL (ref 0–149)
VLDL Cholesterol Cal: 23 mg/dL (ref 5–40)

## 2021-09-30 LAB — CBC WITH DIFFERENTIAL/PLATELET
Basophils Absolute: 0.1 10*3/uL (ref 0.0–0.2)
Basos: 1 %
EOS (ABSOLUTE): 0.2 10*3/uL (ref 0.0–0.4)
Eos: 2 %
Hematocrit: 38.6 % (ref 37.5–51.0)
Hemoglobin: 12.9 g/dL — ABNORMAL LOW (ref 13.0–17.7)
Immature Grans (Abs): 0 10*3/uL (ref 0.0–0.1)
Immature Granulocytes: 0 %
Lymphocytes Absolute: 3 10*3/uL (ref 0.7–3.1)
Lymphs: 43 %
MCH: 26.5 pg — ABNORMAL LOW (ref 26.6–33.0)
MCHC: 33.4 g/dL (ref 31.5–35.7)
MCV: 79 fL (ref 79–97)
Monocytes Absolute: 0.5 10*3/uL (ref 0.1–0.9)
Monocytes: 8 %
Neutrophils Absolute: 3.3 10*3/uL (ref 1.4–7.0)
Neutrophils: 46 %
Platelets: 319 10*3/uL (ref 150–450)
RBC: 4.86 x10E6/uL (ref 4.14–5.80)
RDW: 13 % (ref 11.6–15.4)
WBC: 7.1 10*3/uL (ref 3.4–10.8)

## 2021-10-04 ENCOUNTER — Encounter: Payer: Self-pay | Admitting: General Surgery

## 2021-10-06 ENCOUNTER — Ambulatory Visit: Payer: 59 | Admitting: Internal Medicine

## 2021-10-13 ENCOUNTER — Other Ambulatory Visit: Payer: Self-pay

## 2021-10-13 ENCOUNTER — Ambulatory Visit (INDEPENDENT_AMBULATORY_CARE_PROVIDER_SITE_OTHER): Payer: 59 | Admitting: Internal Medicine

## 2021-10-13 ENCOUNTER — Encounter: Payer: Self-pay | Admitting: Internal Medicine

## 2021-10-13 VITALS — BP 130/85 | HR 84 | Temp 98.4°F | Ht 70.0 in | Wt 249.2 lb

## 2021-10-13 DIAGNOSIS — N529 Male erectile dysfunction, unspecified: Secondary | ICD-10-CM | POA: Diagnosis not present

## 2021-10-13 DIAGNOSIS — F419 Anxiety disorder, unspecified: Secondary | ICD-10-CM | POA: Diagnosis not present

## 2021-10-13 DIAGNOSIS — R42 Dizziness and giddiness: Secondary | ICD-10-CM | POA: Diagnosis not present

## 2021-10-13 MED ORDER — MECLIZINE HCL 25 MG PO TABS: 25.0000 mg | ORAL_TABLET | Freq: Three times a day (TID) | ORAL | 0 refills | Status: DC | PRN

## 2021-10-13 MED ORDER — CLONAZEPAM 1 MG PO TABS: ORAL_TABLET | ORAL | 2 refills | Status: DC

## 2021-10-13 MED ORDER — CITALOPRAM HYDROBROMIDE 10 MG PO TABS: 10.0000 mg | ORAL_TABLET | Freq: Every day | ORAL | 1 refills | Status: DC

## 2021-10-13 NOTE — Progress Notes (Signed)
There were no vitals taken for this visit.   Subjective:    [name], male    DOB: 29-Aug-1966, 55 y.o.   MRN: [id number]  No chief complaint on file.   HPI: Johnny Rios is a 55 y.o. male  Dizziness This is a new problem. The current episode started 1 to 4 weeks ago. The problem has been gradually worsening. Associated symptoms include nausea and a visual change. Pertinent negatives include no abdominal pain, anorexia, change in bowel habit, chest pain, fatigue, fever, headaches, joint swelling, rash, vertigo, vomiting or weakness. Associated symptoms comments: Not a panic attack , felt dizzy, .  Anxiety Presents for follow-up visit. Symptoms include dizziness and nausea. Patient reports no chest pain or compulsions.    Hyperlipidemia This is a chronic problem. The current episode started more than 1 year ago. The problem is controlled. Recent lipid tests were reviewed and are normal. Pertinent negatives include no chest pain.   No chief complaint on file.   Relevant past medical, surgical, family and social history reviewed and updated as indicated. Interim medical history since our last visit reviewed. Allergies and medications reviewed and updated.  Review of Systems  Constitutional:  Negative for fatigue and fever.  Cardiovascular:  Negative for chest pain.  Gastrointestinal:  Positive for nausea. Negative for abdominal pain, anorexia, change in bowel habit and vomiting.  Musculoskeletal:  Negative for joint swelling.  Skin:  Negative for rash.  Neurological:  Positive for dizziness. Negative for vertigo, weakness and headaches.   Per HPI unless specifically indicated above     Objective:    There were no vitals taken for this visit.  Wt Readings from Last 3 Encounters:  09/22/21 248 lb (112.5 kg)  07/06/21 260 lb 3.2 oz (118 kg)  06/23/21 264 lb 9.6 oz (120 kg)    Physical Exam Vitals and nursing note reviewed.  Constitutional:       General: He is not in acute distress.    Appearance: Normal appearance. He is not ill-appearing or diaphoretic.  HENT:     Head: Normocephalic and atraumatic.     Right Ear: Tympanic membrane and external ear normal. There is no impacted cerumen.     Left Ear: External ear normal.     Nose: No congestion or rhinorrhea.     Mouth/Throat:     Pharynx: No oropharyngeal exudate or posterior oropharyngeal erythema.  Eyes:     Conjunctiva/sclera: Conjunctivae normal.     Pupils: Pupils are equal, round, and reactive to light.  Cardiovascular:     Rate and Rhythm: Normal rate and regular rhythm.     Heart sounds: No murmur heard.   No friction rub. No gallop.  Pulmonary:     Effort: No respiratory distress.     Breath sounds: No stridor. No wheezing or rhonchi.  Chest:     Chest wall: No tenderness.  Abdominal:     General: Abdomen is flat. Bowel sounds are normal.     Palpations: Abdomen is soft. There is no mass.     Tenderness: There is no abdominal tenderness.  Musculoskeletal:     Cervical back: Normal range of motion and neck supple. No rigidity or tenderness.     Left lower leg: No edema.  Skin:    General: Skin is warm and dry.  Neurological:     Mental Status: He is alert.    Results for orders placed or performed in visit on 09/29/21  Lipid panel  Result Value Ref Range   Cholesterol, Total 174 100 - 199 mg/dL   Triglycerides 125 0 - 149 mg/dL   HDL 28 (L) >39 mg/dL   VLDL Cholesterol Cal 23 5 - 40 mg/dL   LDL Chol Calc (NIH) 123 (H) 0 - 99 mg/dL   Chol/HDL Ratio 6.2 (H) 0.0 - 5.0 ratio  Bayer DCA Hb A1c Waived  Result Value Ref Range   HB A1C (BAYER DCA - WAIVED) 5.9 (H) 4.8 - 5.6 %  CBC with Differential/Platelet  Result Value Ref Range   WBC 7.1 3.4 - 10.8 x10E3/uL   RBC 4.86 4.14 - 5.80 x10E6/uL   Hemoglobin 12.9 (L) 13.0 - 17.7 g/dL   Hematocrit 38.6 37.5 - 51.0 %   MCV 79 79 - 97 fL   MCH 26.5 (L) 26.6 - 33.0 pg   MCHC 33.4 31.5 - 35.7 g/dL   RDW 13.0  11.6 - 15.4 %   Platelets 319 150 - 450 x10E3/uL   Neutrophils 46 Not Estab. %   Lymphs 43 Not Estab. %   Monocytes 8 Not Estab. %   Eos 2 Not Estab. %   Basos 1 Not Estab. %   Neutrophils Absolute 3.3 1.4 - 7.0 x10E3/uL   Lymphocytes Absolute 3.0 0.7 - 3.1 x10E3/uL   Monocytes Absolute 0.5 0.1 - 0.9 x10E3/uL   EOS (ABSOLUTE) 0.2 0.0 - 0.4 x10E3/uL   Basophils Absolute 0.1 0.0 - 0.2 x10E3/uL   Immature Granulocytes 0 Not Estab. %   Immature Grans (Abs) 0.0 0.0 - 0.1 x10E3/uL  Comprehensive metabolic panel  Result Value Ref Range   Glucose 102 (H) 70 - 99 mg/dL   BUN 12 6 - 24 mg/dL   Creatinine, Ser 1.02 0.76 - 1.27 mg/dL   eGFR 88 >59 mL/min/1.73   BUN/Creatinine Ratio 12 9 - 20   Sodium 137 134 - 144 mmol/L   Potassium 4.1 3.5 - 5.2 mmol/L   Chloride 102 96 - 106 mmol/L   CO2 22 20 - 29 mmol/L   Calcium 9.8 8.7 - 10.2 mg/dL   Total Protein 6.9 6.0 - 8.5 g/dL   Albumin 4.4 3.8 - 4.9 g/dL   Globulin, Total 2.5 1.5 - 4.5 g/dL   Albumin/Globulin Ratio 1.8 1.2 - 2.2   Bilirubin Total 0.4 0.0 - 1.2 mg/dL   Alkaline Phosphatase 78 44 - 121 IU/L   AST 19 0 - 40 IU/L   ALT 23 0 - 44 IU/L        Current Outpatient Medications:    clonazePAM (KLONOPIN) 1 MG tablet, TAKE 1 TABLET BY MOUTH DAILY AS NEEDED, Disp: 30 tablet, Rfl: 1   esomeprazole (NEXIUM) 20 MG capsule, Take 1 capsule (20 mg total) by mouth every morning., Disp: 90 capsule, Rfl: 1   fenofibrate 160 MG tablet, Take 1 tablet (160 mg total) by mouth daily., Disp: 30 tablet, Rfl: 6   ondansetron (ZOFRAN) 8 MG tablet, Take 1 tablet (8 mg total) by mouth every 8 (eight) hours as needed for nausea or vomiting., Disp: 20 tablet, Rfl: 0    Assessment & Plan:  HLD is on fenofibrate Cut back on red meat / eating well. LDL still high a little.  recheck FLP, check LFT's work on diet, SE of meds explained to pt. low fat and high fiber diet explained to pt.  2. ED : will refer to urology   3. Anxiety  Will start pt on celexa  10 mg x  2 weeks, increase to 20 mg x 2 weeks then fu with me.   4. Dizziness: ? Vertigo will start pt on meclizine  A1c - 5.9 -  6.4  Problem List Items Addressed This Visit   None    No orders of the defined types were placed in this encounter.    No orders of the defined types were placed in this encounter.    Follow up plan: No follow-ups on file.

## 2021-10-13 NOTE — Patient Instructions (Signed)
Results for Johnny Rios, CLINGENPEEL (MRN [id number]) as of 10/13/2021 15:47  Ref. Range 09/25/2017 07:10 10/01/2020 15:59 05/31/2021 09:14 06/29/2021 09:03 09/29/2021 15:10  Triglycerides Latest Ref Range: 0 - 149 mg/dL 373 (H) 293 (H) 452 (H) 213 (H) 125

## 2021-10-18 ENCOUNTER — Ambulatory Visit: Payer: 59 | Admitting: Urology

## 2021-10-19 ENCOUNTER — Encounter: Payer: Self-pay | Admitting: Urology

## 2021-10-20 ENCOUNTER — Encounter: Payer: Self-pay | Admitting: Internal Medicine

## 2021-10-20 DIAGNOSIS — R42 Dizziness and giddiness: Secondary | ICD-10-CM | POA: Insufficient documentation

## 2021-10-22 ENCOUNTER — Encounter: Payer: Self-pay | Admitting: Nurse Practitioner

## 2021-10-22 ENCOUNTER — Ambulatory Visit (INDEPENDENT_AMBULATORY_CARE_PROVIDER_SITE_OTHER): Payer: 59 | Admitting: Nurse Practitioner

## 2021-10-22 ENCOUNTER — Ambulatory Visit: Payer: Self-pay

## 2021-10-22 ENCOUNTER — Other Ambulatory Visit: Payer: Self-pay

## 2021-10-22 VITALS — BP 130/90 | HR 79 | Temp 98.5°F | Ht 70.0 in | Wt 253.0 lb

## 2021-10-22 DIAGNOSIS — R42 Dizziness and giddiness: Secondary | ICD-10-CM | POA: Diagnosis not present

## 2021-10-22 NOTE — Progress Notes (Signed)
Acute Office Visit  Subjective:    [name], male    DOB: 12/19/1966, 55 y.o.   MRN: [id number]  Chief Complaint  Patient presents with   Dizziness    Seen Dr. Neomia Dear on 10/19 for Dizziness, still having, feels light headed, feels like he is in a fog, and feels like his face is numb    HPI Patient is in today for light-headedness and foggy headed feeling for 1 week. He also states he feels like his face is having numbness. The dizziness he had last visit has mostly resolved. His blood pressure has been running 110s/80s at home.  He denies slurring speech, weakness, and facial droop.   DIZZINESS  Duration: weeks Description of symptoms: lightheaded Duration of episode:  light-headed all the time but gets worse at times Dizziness frequency: no history of the same Provoking factors:  dizziness when bending over Aggravating factors:   bending over, laying down in bed Triggered by rolling over in bed: no Triggered by bending over: yes Aggravated by head movement: yes Aggravated by exertion, coughing, loud noises: no Recent head injury: no Recent or current viral symptoms: no History of vasovagal episodes: no Nausea: yes Vomiting: no Tinnitus: no Hearing loss: no Aural fullness: no Headache: no Photophobia/phonophobia: no Unsteady gait: no Postural instability: no Diplopia, dysarthria, dysphagia or weakness:  blurred vision that comes and goes Related to exertion: no Pallor: no Diaphoresis: no Dyspnea: no Chest pain: no   Past Medical History:  Diagnosis Date   Anxiety    Arthritis    GERD (gastroesophageal reflux disease)    Sleep apnea    Uses CPAP   Tachycardia     Past Surgical History:  Procedure Laterality Date   APPENDECTOMY     CHOLECYSTECTOMY N/A 01/19/2017   Procedure: LAPAROSCOPIC CHOLECYSTECTOMY WITH INTRAOPERATIVE CHOLANGIOGRAM;  Surgeon: Robert Bellow, MD;  Location: [facility];  Service: General;  Laterality: N/A;    COLONOSCOPY WITH PROPOFOL N/A 09/22/2021   Procedure: COLONOSCOPY WITH PROPOFOL;  Surgeon: Virgel Manifold, MD;  Location: [facility];  Service: Endoscopy;  Laterality: N/A;   LACERATION REPAIR Left 04/04/2019   Procedure: Repair Multiple Lacerations;  Surgeon: Leanora Cover, MD;  Location: [place];  Service: Orthopedics;  Laterality: Left;   MINOR NAILBED REPAIR Left 04/04/2019   Procedure: Minor Nailbed Repair;  Surgeon: Leanora Cover, MD;  Location: [place];  Service: Orthopedics;  Laterality: Left;   NERVE AND TENDON REPAIR Left 04/04/2019   Procedure: IRRIGATION AND DEBRIDEMENT OF FIXATION OF OPEN FRACTURES LONG ,RING FINGER;  Surgeon: Leanora Cover, MD;  Location: [place];  Service: Orthopedics;  Laterality: Left;   TONSILLECTOMY     UMBILICAL HERNIA REPAIR N/A 06/12/2017   Ventral hernia with 6 cm Ventralex ST mesh.   VENTRAL HERNIA REPAIR N/A 07/11/2018   15 x 20 cm intraperitoneal Ventralex mesh  ;  Surgeon: Robert Bellow, MD;  Location: [facility];  Service: General;  Laterality: N/A;    Family History  Problem Relation Age of Onset   Cancer Father        lung   Alzheimer's disease Mother    Heart attack Brother    Heart attack Brother     Social History   Socioeconomic History   Marital status: Single    Spouse name: Not on file   Number of children: 2   Years of education: HS   Highest education level: Not on file  Occupational History   Not  on file  Tobacco Use   Smoking status: Never   Smokeless tobacco: Former    Types: Associate Professor Use: Never used  Substance and Sexual Activity   Alcohol use: Yes    Comment: occasional   Drug use: No   Sexual activity: Not on file  Other Topics Concern   Not on file  Social History Narrative   Drinks 3-4 caffeine drinks a day    Social Determinants of Health   Financial Resource Strain: Not on file  Food Insecurity: Not on file  Transportation Needs: Not on file  Physical Activity: Not on file  Stress:  Not on file  Social Connections: Not on file  Intimate Partner Violence: Not on file    Outpatient Medications Prior to Visit  Medication Sig Dispense Refill   citalopram (CELEXA) 10 MG tablet Take 1 tablet (10 mg total) by mouth daily. Take 1 tab x 2 weeks then 2 tabs x 2 weeks and rtc. 60 tablet 1   clonazePAM (KLONOPIN) 1 MG tablet TAKE 1 TABLET BY MOUTH DAILY AS NEEDED 31 tablet 2   esomeprazole (NEXIUM) 20 MG capsule Take 1 capsule (20 mg total) by mouth every morning. 90 capsule 1   fenofibrate 160 MG tablet Take 1 tablet (160 mg total) by mouth daily. 30 tablet 6   meclizine (ANTIVERT) 25 MG tablet Take 1 tablet (25 mg total) by mouth 3 (three) times daily as needed for dizziness. 30 tablet 0   ondansetron (ZOFRAN) 8 MG tablet Take 1 tablet (8 mg total) by mouth every 8 (eight) hours as needed for nausea or vomiting. 20 tablet 0   No facility-administered medications prior to visit.    No Known Allergies  Review of Systems  Constitutional:  Positive for fatigue.  HENT: Negative.    Eyes: Negative.   Respiratory: Negative.    Cardiovascular: Negative.   Gastrointestinal:  Positive for nausea. Negative for abdominal pain, constipation, diarrhea and vomiting.  Genitourinary: Negative.   Musculoskeletal: Negative.   Skin: Negative.   Neurological:  Positive for light-headedness and numbness. Negative for dizziness, syncope, weakness and headaches.      Objective:    Physical Exam Vitals and nursing note reviewed.  Constitutional:      Appearance: Normal appearance.  HENT:     Head: Normocephalic.     Right Ear: Tympanic membrane, ear canal and external ear normal.     Left Ear: Tympanic membrane, ear canal and external ear normal.  Eyes:     Extraocular Movements: Extraocular movements intact.     Conjunctiva/sclera: Conjunctivae normal.     Pupils: Pupils are equal, round, and reactive to light.  Cardiovascular:     Rate and Rhythm: Normal rate and regular rhythm.      Pulses: Normal pulses.     Heart sounds: Normal heart sounds.  Pulmonary:     Effort: Pulmonary effort is normal.     Breath sounds: Normal breath sounds.  Abdominal:     Palpations: Abdomen is soft.     Tenderness: There is no abdominal tenderness.  Musculoskeletal:        General: Normal range of motion.     Cervical back: Normal range of motion.     Right lower leg: No edema.     Left lower leg: No edema.     Comments: Strength 5/5 in bilateral upper and lower extremities   Skin:    General: Skin is warm.  Neurological:  General: No focal deficit present.     Mental Status: He is alert and oriented to person, place, and time.     Cranial Nerves: No cranial nerve deficit.     Sensory: No sensory deficit.     Motor: No weakness.     Coordination: Coordination normal.     Gait: Gait normal.  Psychiatric:        Mood and Affect: Mood normal.        Behavior: Behavior normal.        Thought Content: Thought content normal.        Judgment: Judgment normal.    BP 130/90   Pulse 79   Temp 98.5 F (36.9 C) (Oral)   Ht $R'5\' 10"'Dk$  (1.778 m)   Wt 253 lb (114.8 kg)   SpO2 97%   BMI 36.30 kg/m  Wt Readings from Last 3 Encounters:  10/22/21 253 lb (114.8 kg)  10/13/21 249 lb 3.2 oz (113 kg)  09/22/21 248 lb (112.5 kg)    Health Maintenance Due  Topic Date Due   URINE MICROALBUMIN  Never done    There are no preventive care reminders to display for this patient.   Lab Results  Component Value Date   TSH 1.450 06/29/2021   Lab Results  Component Value Date   WBC 7.1 09/29/2021   HGB 12.9 (L) 09/29/2021   HCT 38.6 09/29/2021   MCV 79 09/29/2021   PLT 319 09/29/2021   Lab Results  Component Value Date   NA 137 09/29/2021   K 4.1 09/29/2021   CO2 22 09/29/2021   GLUCOSE 102 (H) 09/29/2021   BUN 12 09/29/2021   CREATININE 1.02 09/29/2021   BILITOT 0.4 09/29/2021   ALKPHOS 78 09/29/2021   AST 19 09/29/2021   ALT 23 09/29/2021   PROT 6.9 09/29/2021    ALBUMIN 4.4 09/29/2021   CALCIUM 9.8 09/29/2021   ANIONGAP 4 (L) 06/16/2021   EGFR 88 09/29/2021   Lab Results  Component Value Date   CHOL 174 09/29/2021   Lab Results  Component Value Date   HDL 28 (L) 09/29/2021   Lab Results  Component Value Date   LDLCALC 123 (H) 09/29/2021   Lab Results  Component Value Date   TRIG 125 09/29/2021   Lab Results  Component Value Date   CHOLHDL 6.2 (H) 09/29/2021   Lab Results  Component Value Date   HGBA1C 5.9 (H) 09/29/2021       Assessment & Plan:   Problem List Items Addressed This Visit   None Visit Diagnoses     Light headed    -  Primary   Labs reviewed from 2 weeks ago. Will check lyme, RMSF, vitamin B12 and RPR today. If labs normal and symtoms still onging, will order MRI.    Relevant Orders   Rocky mtn spotted fvr abs pnl(IgG+IgM)   Lyme Disease Serology w/Reflex   RPR   Vitamin B12        No orders of the defined types were placed in this encounter.    Charyl Dancer, NP

## 2021-10-22 NOTE — Patient Instructions (Signed)
Increase your fluids We will be in touch next week with lab results, if labs are normal and still having symptoms, we will schedule an MRI of your head

## 2021-10-22 NOTE — Telephone Encounter (Signed)
Pt. Reports he has been treated for dizziness in the past, but this week he is having lightheadedness, blurred vision at times. "I'm able to function, but I don't want to take the medicine for dizziness." No other symptoms. Appointment made for today.   Reason for Disposition  [1] MODERATE dizziness (e.g., interferes with normal activities) AND [2] has been evaluated by physician for this  Answer Assessment - Initial Assessment Questions 1. DESCRIPTION: "Describe your dizziness."     Dizzy 2. LIGHTHEADED: "Do you feel lightheaded?" (e.g., somewhat faint, woozy, weak upon standing)     Foggy 3. VERTIGO: "Do you feel like either you or the room is spinning or tilting?" (i.e. vertigo)     No 4. SEVERITY: "How bad is it?"  "Do you feel like you are going to faint?" "Can you stand and walk?"   - MILD: Feels slightly dizzy, but walking normally.   - MODERATE: Feels unsteady when walking, but not falling; interferes with normal activities (e.g., school, work).   - SEVERE: Unable to walk without falling, or requires assistance to walk without falling; feels like passing out now.      Mild 5. ONSET:  "When did the dizziness begin?"     This week 6. AGGRAVATING FACTORS: "Does anything make it worse?" (e.g., standing, change in head position)     No 7. HEART RATE: "Can you tell me your heart rate?" "How many beats in 15 seconds?"  (Note: not all patients can do this)       No 8. CAUSE: "What do you think is causing the dizziness?"     Unsure 9. RECURRENT SYMPTOM: "Have you had dizziness before?" If Yes, ask: "When was the last time?" "What happened that time?"     Yes 10. OTHER SYMPTOMS: "Do you have any other symptoms?" (e.g., fever, chest pain, vomiting, diarrhea, bleeding)       Blurred vision 11. PREGNANCY: "Is there any chance you are pregnant?" "When was your last menstrual period?"       N/a  Protocols used: Dizziness - Lightheadedness-A-AH

## 2021-10-25 ENCOUNTER — Telehealth: Payer: Self-pay | Admitting: Internal Medicine

## 2021-10-25 DIAGNOSIS — F419 Anxiety disorder, unspecified: Secondary | ICD-10-CM

## 2021-10-25 NOTE — Telephone Encounter (Signed)
Requested medications are due for refill today.  unknown  Requested medications are on the active medications list.  yes  Last refill. 10/13/2021  Future visit scheduled.   yes  Notes to clinic.  Medication not delegated.

## 2021-10-25 NOTE — Telephone Encounter (Signed)
Copied from [place] [id number]. Topic: General - Other >> Oct 25, 2021  2:59 PM Tessa Lerner A wrote: Reason for CRM: The patient has called to share that they have been in contact with their pharmacy and been directed to contact their PCP about a prior authorization on their prescription for clonazePAM (KLONOPIN) 1 MG tablet [[id number]]  The patient has 1 tablet remaining and would like to pick up their medication on 10/26/21  Please contact further when available

## 2021-10-26 ENCOUNTER — Other Ambulatory Visit: Payer: Self-pay | Admitting: Internal Medicine

## 2021-10-26 DIAGNOSIS — F419 Anxiety disorder, unspecified: Secondary | ICD-10-CM

## 2021-10-26 LAB — RPR: RPR Ser Ql: NONREACTIVE

## 2021-10-26 LAB — ROCKY MTN SPOTTED FVR ABS PNL(IGG+IGM)
RMSF IgG: NEGATIVE
RMSF IgM: 0.49 index (ref 0.00–0.89)

## 2021-10-26 LAB — VITAMIN B12: Vitamin B-12: 453 pg/mL (ref 232–1245)

## 2021-10-26 LAB — LYME DISEASE SEROLOGY W/REFLEX: Lyme Total Antibody EIA: NEGATIVE

## 2021-10-26 MED ORDER — CLONAZEPAM 1 MG PO TABS: ORAL_TABLET | ORAL | 2 refills | Status: DC

## 2021-10-26 NOTE — Addendum Note (Signed)
Addended by: Vance Peper A on: 10/26/2021 03:41 PM   Modules accepted: Orders

## 2021-10-26 NOTE — Telephone Encounter (Signed)
Patient called in again stated that hehave been waiting 25 minutes for a call back. Demanding that Dr Johnny Rios make sure he get his medication today or he will be coming to the office. Please advise

## 2021-10-26 NOTE — Telephone Encounter (Signed)
Request was already sent to the clinical staff this morning, pending review.

## 2021-10-26 NOTE — Telephone Encounter (Signed)
Requested medication (s) are due for refill today:   Provider to review  Requested medication (s) are on the active medication list:   Yes  Future visit scheduled:   Yes   Last ordered: 10/26/2021 #31, 2 refills  Non delegated refill  Duplicate request   Requested Prescriptions  Pending Prescriptions Disp Refills   clonazePAM (KLONOPIN) 1 MG tablet [Pharmacy Med Name: CLONAZEPAM 1 MG TABLET] 30 tablet 0    Sig: TAKE ONE TABLET DAILY AS NEEDED.     Not Delegated - Psychiatry:  Anxiolytics/Hypnotics Failed - 10/26/2021 11:39 AM      Failed - This refill cannot be delegated      Passed - Urine Drug Screen completed in last 360 days      Passed - Valid encounter within last 6 months    Recent Outpatient Visits           4 days ago Light headed   [facility], Lauren A, NP   1 week ago Erectile dysfunction, unspecified erectile dysfunction type   [facility] Vigg, Avanti, MD   3 months ago PUD (peptic ulcer disease)   [facility] Vigg, Avanti, MD   4 months ago SBO (small bowel obstruction) ([place])   [facility] Vigg, Avanti, MD   4 months ago PUD (peptic ulcer disease)   [facility] Vigg, Avanti, MD       Future Appointments             In 2 weeks Vigg, Avanti, MD [facility]

## 2021-10-26 NOTE — Telephone Encounter (Signed)
Prescription has been sent to the pharmacy. 

## 2021-10-27 NOTE — Telephone Encounter (Signed)
Meds were sent in yesterday pl ask him to check with pahramcy

## 2021-10-27 NOTE — Telephone Encounter (Signed)
Spoke with patient and patient informed me that he picked up his prescription from the pharmacy yesterday.

## 2021-11-10 ENCOUNTER — Other Ambulatory Visit: Payer: Self-pay

## 2021-11-10 ENCOUNTER — Ambulatory Visit (INDEPENDENT_AMBULATORY_CARE_PROVIDER_SITE_OTHER): Payer: 59 | Admitting: Internal Medicine

## 2021-11-10 ENCOUNTER — Encounter: Payer: Self-pay | Admitting: Internal Medicine

## 2021-11-10 VITALS — BP 122/87 | HR 89 | Temp 98.4°F | Ht 70.08 in | Wt 257.0 lb

## 2021-11-10 DIAGNOSIS — E785 Hyperlipidemia, unspecified: Secondary | ICD-10-CM

## 2021-11-10 DIAGNOSIS — R42 Dizziness and giddiness: Secondary | ICD-10-CM | POA: Diagnosis not present

## 2021-11-10 DIAGNOSIS — R7303 Prediabetes: Secondary | ICD-10-CM

## 2021-11-10 MED ORDER — ATORVASTATIN CALCIUM 20 MG PO TABS: 20.0000 mg | ORAL_TABLET | Freq: Every day | ORAL | 3 refills | Status: DC

## 2021-11-10 NOTE — Progress Notes (Signed)
BP 122/87   Pulse 89   Temp 98.4 F (36.9 C) (Oral)   Ht 5' 10.08" (1.78 m)   Wt 257 lb (116.6 kg)   SpO2 99%   BMI 36.79 kg/m    Subjective:    [name], male    DOB: 1966-07-09, 55 y.o.   MRN: [id number]  Chief Complaint  Patient presents with   ED   Peptic Ulcer Disease   Elevated PSA    HPI: Johnny Rios is a 55 y.o. male  Was seen by NP student here on 10 /28/ 22  " feels light headed, feels like he is in a fog, and feels like his face is numb " symptoms better today but lightheaded when he lays donw at night   Dizziness This is a chronic problem. The current episode started more than 1 month ago. The problem has been waxing and waning. Pertinent negatives include no abdominal pain, anorexia, arthralgias, change in bowel habit, chest pain, chills, congestion, coughing, diaphoresis, fatigue, fever, joint swelling, myalgias, nausea, neck pain, numbness, rash, sore throat, swollen glands, urinary symptoms, vertigo, visual change, vomiting or weakness.   Chief Complaint  Patient presents with   ED   Peptic Ulcer Disease   Elevated PSA    Relevant past medical, surgical, family and social history reviewed and updated as indicated. Interim medical history since our last visit reviewed. Allergies and medications reviewed and updated.  Review of Systems  Constitutional:  Negative for chills, diaphoresis, fatigue and fever.  HENT:  Negative for congestion and sore throat.   Respiratory:  Negative for cough.   Cardiovascular:  Negative for chest pain.  Gastrointestinal:  Negative for abdominal pain, anorexia, change in bowel habit, nausea and vomiting.  Musculoskeletal:  Negative for arthralgias, joint swelling, myalgias and neck pain.  Skin:  Negative for rash.  Neurological:  Positive for dizziness. Negative for vertigo, weakness and numbness.   Per HPI unless specifically indicated above     Objective:    BP 122/87   Pulse 89   Temp 98.4  F (36.9 C) (Oral)   Ht 5' 10.08" (1.78 m)   Wt 257 lb (116.6 kg)   SpO2 99%   BMI 36.79 kg/m   Wt Readings from Last 3 Encounters:  11/10/21 257 lb (116.6 kg)  10/22/21 253 lb (114.8 kg)  10/13/21 249 lb 3.2 oz (113 kg)    Physical Exam Vitals and nursing note reviewed.  Constitutional:      General: He is not in acute distress.    Appearance: Normal appearance. He is not ill-appearing or diaphoretic.  HENT:     Head: Normocephalic and atraumatic.     Comments: US carotid wnl     Right Ear: Tympanic membrane and external ear normal. There is no impacted cerumen.     Left Ear: External ear normal.     Nose: No congestion or rhinorrhea.     Mouth/Throat:     Pharynx: No oropharyngeal exudate or posterior oropharyngeal erythema.  Eyes:     Conjunctiva/sclera: Conjunctivae normal.     Pupils: Pupils are equal, round, and reactive to light.  Cardiovascular:     Rate and Rhythm: Normal rate and regular rhythm.     Heart sounds: No murmur heard.   No friction rub. No gallop.  Pulmonary:     Effort: No respiratory distress.     Breath sounds: No stridor. No wheezing or rhonchi.  Chest:  Chest wall: No tenderness.  Abdominal:     General: Abdomen is flat. Bowel sounds are normal.     Palpations: Abdomen is soft. There is no mass.     Tenderness: There is no abdominal tenderness.  Musculoskeletal:     Cervical back: Normal range of motion and neck supple. No rigidity or tenderness.     Left lower leg: No edema.  Skin:    General: Skin is warm and dry.  Neurological:     General: No focal deficit present.     Mental Status: He is alert and oriented to person, place, and time. Mental status is at baseline.    Results for orders placed or performed in visit on 10/22/21  Rocky mtn spotted fvr abs pnl(IgG+IgM)  Result Value Ref Range   RMSF IgG Negative Negative   RMSF IgM 0.49 0.00 - 0.89 index  Lyme Disease Serology w/Reflex  Result Value Ref Range   Lyme Total  Antibody EIA Negative Negative  RPR  Result Value Ref Range   RPR Ser Ql Non Reactive Non Reactive  Vitamin B12  Result Value Ref Range   Vitamin B-12 453 232 - 1,245 pg/mL        Current Outpatient Medications:    atorvastatin (LIPITOR) 20 MG tablet, Take 1 tablet (20 mg total) by mouth daily., Disp: 30 tablet, Rfl: 3   citalopram (CELEXA) 10 MG tablet, Take 1 tablet (10 mg total) by mouth daily. Take 1 tab x 2 weeks then 2 tabs x 2 weeks and rtc., Disp: 60 tablet, Rfl: 1   clonazePAM (KLONOPIN) 1 MG tablet, TAKE ONE TABLET DAILY AS NEEDED., Disp: 30 tablet, Rfl: 0   clonazePAM (KLONOPIN) 1 MG tablet, TAKE 1 TABLET BY MOUTH DAILY AS NEEDED, Disp: 31 tablet, Rfl: 2   esomeprazole (NEXIUM) 20 MG capsule, Take 1 capsule (20 mg total) by mouth every morning., Disp: 90 capsule, Rfl: 1   fenofibrate 160 MG tablet, Take 1 tablet (160 mg total) by mouth daily., Disp: 30 tablet, Rfl: 6   meclizine (ANTIVERT) 25 MG tablet, Take 1 tablet (25 mg total) by mouth 3 (three) times daily as needed for dizziness., Disp: 30 tablet, Rfl: 0   ondansetron (ZOFRAN) 8 MG tablet, Take 1 tablet (8 mg total) by mouth every 8 (eight) hours as needed for nausea or vomiting., Disp: 20 tablet, Rfl: 0    Assessment & Plan:  Lightheaded : at night when he lies down.   A1c at 6.4  Will refer to neurology  Check US carotid   2. HLD  to conitnue working on diet, SE of meds explained to pt. low fat and high fiber diet explained to pt.  Add Lipitor as pts LDL is higher Continue fenofibrate for HTG - TG level smuch better congratualted pt on doing such a great job with his eating habits.    Problem List Items Addressed This Visit       Other   Dizziness   Relevant Orders   Ambulatory referral to Neurology   US Carotid Duplex Bilateral   CBC with Differential/Platelet   Comprehensive metabolic panel   Lipid panel   Hyperlipidemia   Relevant Medications   atorvastatin (LIPITOR) 20 MG tablet   Other Relevant  Orders   CBC with Differential/Platelet   Comprehensive metabolic panel   Lipid panel   Prediabetes - Primary   Relevant Orders   Bayer DCA Hb A1c Waived (STAT)     Orders Placed This Encounter  Procedures   US Carotid Duplex Bilateral   Bayer DCA Hb A1c Waived (STAT)   CBC with Differential/Platelet   Comprehensive metabolic panel   Lipid panel   Ambulatory referral to Neurology     Meds ordered this encounter  Medications   atorvastatin (LIPITOR) 20 MG tablet    Sig: Take 1 tablet (20 mg total) by mouth daily.    Dispense:  30 tablet    Refill:  3     Follow up plan: Return in about 6 weeks (around 12/22/2021).

## 2021-12-20 ENCOUNTER — Other Ambulatory Visit: Payer: Self-pay | Admitting: Internal Medicine

## 2021-12-20 DIAGNOSIS — K279 Peptic ulcer, site unspecified, unspecified as acute or chronic, without hemorrhage or perforation: Secondary | ICD-10-CM

## 2021-12-21 NOTE — Telephone Encounter (Signed)
Requested Prescriptions  Pending Prescriptions Disp Refills   esomeprazole (NEXIUM) 20 MG capsule [Pharmacy Med Name: ESOMEPRAZOLE MAG DR 20 MG CAP] 90 capsule 0    Sig: Take 1 capsule (20 mg total) by mouth every morning.     Gastroenterology: Proton Pump Inhibitors Passed - 12/20/2021  9:30 AM      Passed - Valid encounter within last 12 months    Recent Outpatient Visits          1 month ago Prediabetes   [facility] Vigg, Avanti, MD   2 months ago Light headed   [facility], Lauren A, NP   2 months ago Erectile dysfunction, unspecified erectile dysfunction type   [facility] Vigg, Avanti, MD   5 months ago PUD (peptic ulcer disease)   [facility] Vigg, Avanti, MD   6 months ago SBO (small bowel obstruction) ([place])   [facility] Vigg, Avanti, MD      Future Appointments            Tomorrow Vigg, Avanti, MD [facility]

## 2021-12-22 ENCOUNTER — Encounter: Payer: Self-pay | Admitting: Internal Medicine

## 2021-12-22 ENCOUNTER — Ambulatory Visit (INDEPENDENT_AMBULATORY_CARE_PROVIDER_SITE_OTHER): Payer: 59 | Admitting: Internal Medicine

## 2021-12-22 ENCOUNTER — Other Ambulatory Visit: Payer: Self-pay

## 2021-12-22 DIAGNOSIS — F419 Anxiety disorder, unspecified: Secondary | ICD-10-CM | POA: Diagnosis not present

## 2021-12-22 DIAGNOSIS — E782 Mixed hyperlipidemia: Secondary | ICD-10-CM

## 2021-12-22 MED ORDER — CLONAZEPAM 1 MG PO TABS: ORAL_TABLET | ORAL | 0 refills | Status: DC

## 2021-12-22 MED ORDER — FENOFIBRATE 160 MG PO TABS: 160.0000 mg | ORAL_TABLET | Freq: Every day | ORAL | 6 refills | Status: DC

## 2021-12-22 NOTE — Progress Notes (Signed)
BP 110/77    Pulse 84    Temp 98.6 F (37 C) (Oral)    Ht 5' 10.08" (1.78 m)    Wt 257 lb 3.2 oz (116.7 kg)    SpO2 98%    BMI 36.82 kg/m    Subjective:    [name], male    DOB: August 10, 1966, 55 y.o.   MRN: [id number]  Chief Complaint  Patient presents with   Prediabetes   Erectile Dysfunction    HPI: MCCRAE SPECIALE is a 55 y.o. male  Hyperlipidemia This is a chronic problem. The problem is controlled. Pertinent negatives include no chest pain.  Anxiety Presents for follow-up visit. Symptoms include nervous/anxious behavior. Patient reports no chest pain, compulsions, confusion, decreased concentration, depressed mood, dizziness, dry mouth, excessive worry, feeling of choking, irritability, malaise, muscle tension, nausea, palpitations, panic or restlessness.     Chief Complaint  Patient presents with   Prediabetes   Erectile Dysfunction    Relevant past medical, surgical, family and social history reviewed and updated as indicated. Interim medical history since our last visit reviewed. Allergies and medications reviewed and updated.  Review of Systems  Constitutional:  Negative for irritability.  Cardiovascular:  Negative for chest pain and palpitations.  Gastrointestinal:  Negative for nausea.  Neurological:  Negative for dizziness.  Psychiatric/Behavioral:  Negative for confusion and decreased concentration. The patient is nervous/anxious.    Per HPI unless specifically indicated above     Objective:    BP 110/77    Pulse 84    Temp 98.6 F (37 C) (Oral)    Ht 5' 10.08" (1.78 m)    Wt 257 lb 3.2 oz (116.7 kg)    SpO2 98%    BMI 36.82 kg/m   Wt Readings from Last 3 Encounters:  12/22/21 257 lb 3.2 oz (116.7 kg)  11/10/21 257 lb (116.6 kg)  10/22/21 253 lb (114.8 kg)    Physical Exam  Results for orders placed or performed in visit on 10/22/21  Rocky mtn spotted fvr abs pnl(IgG+IgM)  Result Value Ref Range   RMSF IgG Negative Negative    RMSF IgM 0.49 0.00 - 0.89 index  Lyme Disease Serology w/Reflex  Result Value Ref Range   Lyme Total Antibody EIA Negative Negative  RPR  Result Value Ref Range   RPR Ser Ql Non Reactive Non Reactive  Vitamin B12  Result Value Ref Range   Vitamin B-12 453 232 - 1,245 pg/mL        Current Outpatient Medications:    atorvastatin (LIPITOR) 20 MG tablet, Take 1 tablet (20 mg total) by mouth daily., Disp: 30 tablet, Rfl: 3   citalopram (CELEXA) 10 MG tablet, Take 1 tablet (10 mg total) by mouth daily. Take 1 tab x 2 weeks then 2 tabs x 2 weeks and rtc., Disp: 60 tablet, Rfl: 1   clonazePAM (KLONOPIN) 1 MG tablet, TAKE ONE TABLET DAILY AS NEEDED., Disp: 30 tablet, Rfl: 0   clonazePAM (KLONOPIN) 1 MG tablet, TAKE 1 TABLET BY MOUTH DAILY AS NEEDED, Disp: 31 tablet, Rfl: 2   esomeprazole (NEXIUM) 20 MG capsule, Take 1 capsule (20 mg total) by mouth every morning., Disp: 90 capsule, Rfl: 0   fenofibrate 160 MG tablet, Take 1 tablet (160 mg total) by mouth daily., Disp: 30 tablet, Rfl: 6   meclizine (ANTIVERT) 25 MG tablet, Take 1 tablet (25 mg total) by mouth 3 (three) times daily as needed for dizziness., Disp: 30 tablet,  Rfl: 0   ondansetron (ZOFRAN) 8 MG tablet, Take 1 tablet (8 mg total) by mouth every 8 (eight) hours as needed for nausea or vomiting., Disp: 20 tablet, Rfl: 0    Assessment & Plan:  HLD is on fenofibrate Cut back on red meat / eating well. LDL still high a little.  recheck FLP, check LFT's work on diet, SE of meds explained to pt. low fat and high fiber diet explained to pt.   2.  Anxiety : didn't take celexa but for one day.   Restart celexa  Take 10 mg ie 1 pill DAILY x 2 weeks Then 2 pills daily x 2 weeks. Then do virtual visit. Cut back on klonipin to 1/2 week 3   3. Dizziness: ? Vertigo will start pt on meclizine  A1c - 5.9 -  6.4   Problem List Items Addressed This Visit       Other   Anxiety   Mixed hyperlipidemia     No orders of the defined types  were placed in this encounter.    Meds ordered this encounter  Medications   clonazePAM (KLONOPIN) 1 MG tablet    Sig: TAKE ONE TABLET DAILY AS NEEDED.    Dispense:  30 tablet    Refill:  0   fenofibrate 160 MG tablet    Sig: Take 1 tablet (160 mg total) by mouth daily.    Dispense:  30 tablet    Refill:  6     Follow up plan: No follow-ups on file.

## 2021-12-22 NOTE — Patient Instructions (Addendum)
Restart celexa  Take 10 mg ie 1 pill DAILY x 2 weeks Then 2 pills daily x 2 weeks. Then do virtual visit. Cut back on klonipin to 1/2 week 3

## 2022-01-12 ENCOUNTER — Other Ambulatory Visit: Payer: Self-pay

## 2022-01-12 ENCOUNTER — Other Ambulatory Visit: Payer: Managed Care, Other (non HMO)

## 2022-01-12 DIAGNOSIS — R42 Dizziness and giddiness: Secondary | ICD-10-CM

## 2022-01-12 DIAGNOSIS — E785 Hyperlipidemia, unspecified: Secondary | ICD-10-CM

## 2022-01-12 DIAGNOSIS — F419 Anxiety disorder, unspecified: Secondary | ICD-10-CM

## 2022-01-12 DIAGNOSIS — E782 Mixed hyperlipidemia: Secondary | ICD-10-CM

## 2022-01-13 LAB — CBC WITH DIFFERENTIAL/PLATELET
Basophils Absolute: 0.1 10*3/uL (ref 0.0–0.2)
Basos: 1 %
EOS (ABSOLUTE): 0.2 10*3/uL (ref 0.0–0.4)
Eos: 2 %
Hematocrit: 40.3 % (ref 37.5–51.0)
Hemoglobin: 13.9 g/dL (ref 13.0–17.7)
Immature Grans (Abs): 0 10*3/uL (ref 0.0–0.1)
Immature Granulocytes: 0 %
Lymphocytes Absolute: 3.4 10*3/uL — ABNORMAL HIGH (ref 0.7–3.1)
Lymphs: 45 %
MCH: 27.5 pg (ref 26.6–33.0)
MCHC: 34.5 g/dL (ref 31.5–35.7)
MCV: 80 fL (ref 79–97)
Monocytes Absolute: 0.6 10*3/uL (ref 0.1–0.9)
Monocytes: 8 %
Neutrophils Absolute: 3.2 10*3/uL (ref 1.4–7.0)
Neutrophils: 44 %
Platelets: 316 10*3/uL (ref 150–450)
RBC: 5.06 x10E6/uL (ref 4.14–5.80)
RDW: 12.3 % (ref 11.6–15.4)
WBC: 7.4 10*3/uL (ref 3.4–10.8)

## 2022-01-13 LAB — COMPREHENSIVE METABOLIC PANEL
ALT: 34 IU/L (ref 0–44)
AST: 25 IU/L (ref 0–40)
Albumin/Globulin Ratio: 1.9 (ref 1.2–2.2)
Albumin: 4.5 g/dL (ref 3.8–4.9)
Alkaline Phosphatase: 77 IU/L (ref 44–121)
BUN/Creatinine Ratio: 10 (ref 9–20)
BUN: 11 mg/dL (ref 6–24)
Bilirubin Total: 0.4 mg/dL (ref 0.0–1.2)
CO2: 23 mmol/L (ref 20–29)
Calcium: 10.2 mg/dL (ref 8.7–10.2)
Chloride: 102 mmol/L (ref 96–106)
Creatinine, Ser: 1.05 mg/dL (ref 0.76–1.27)
Globulin, Total: 2.4 g/dL (ref 1.5–4.5)
Glucose: 96 mg/dL (ref 70–99)
Potassium: 4.3 mmol/L (ref 3.5–5.2)
Sodium: 140 mmol/L (ref 134–144)
Total Protein: 6.9 g/dL (ref 6.0–8.5)
eGFR: 85 mL/min/{1.73_m2} (ref >59)

## 2022-01-13 LAB — LIPID PANEL
Chol/HDL Ratio: 4.2 ratio (ref 0.0–5.0)
Cholesterol, Total: 122 mg/dL (ref 100–199)
HDL: 29 mg/dL — ABNORMAL LOW (ref >39)
LDL Chol Calc (NIH): 70 mg/dL (ref 0–99)
Triglycerides: 127 mg/dL (ref 0–149)
VLDL Cholesterol Cal: 23 mg/dL (ref 5–40)

## 2022-01-13 LAB — TSH: TSH: 1.17 u[IU]/mL (ref 0.450–4.500)

## 2022-01-19 ENCOUNTER — Encounter: Payer: Self-pay | Admitting: Internal Medicine

## 2022-01-19 ENCOUNTER — Telehealth: Payer: Self-pay

## 2022-01-19 ENCOUNTER — Telehealth (INDEPENDENT_AMBULATORY_CARE_PROVIDER_SITE_OTHER): Payer: Managed Care, Other (non HMO) | Admitting: Internal Medicine

## 2022-01-19 DIAGNOSIS — F419 Anxiety disorder, unspecified: Secondary | ICD-10-CM | POA: Diagnosis not present

## 2022-01-19 NOTE — Progress Notes (Signed)
There were no vitals taken for this visit.   Subjective:    [name], male    DOB: 07-24-1966, 56 y.o.   MRN: [id number]  Chief Complaint  Patient presents with   Anxiety    Was started on Celexa but only took it for 2 days and want so stick with what he knows work.   Dizziness    Started having dizzy spell last week, happens when he lays down in bed at night and the first thing in the morning when he gets up.     HPI: Johnny Rios is a 56 y.o. male  Anxiety Presents for follow-up visit. Symptoms include dizziness and nervous/anxious behavior. Patient reports no chest pain, compulsions, decreased concentration, depressed mood, dry mouth, excessive worry, feeling of choking, hyperventilation, impotence, insomnia, irritability, malaise, muscle tension, nausea, obsessions, palpitations, panic, restlessness, shortness of breath or suicidal ideas.    Dizziness This is a recurrent (started last thursday got dizzy when he lays down and then goes away. happens for a second or so and then goes away) problem. The problem has been gradually worsening. Pertinent negatives include no chest pain, nausea, rash, sore throat or swollen glands.   Chief Complaint  Patient presents with   Anxiety    Was started on Celexa but only took it for 2 days and want so stick with what he knows work.   Dizziness    Started having dizzy spell last week, happens when he lays down in bed at night and the first thing in the morning when he gets up.     Relevant past medical, surgical, family and social history reviewed and updated as indicated. Interim medical history since our last visit reviewed. Allergies and medications reviewed and updated.  Review of Systems  Constitutional:  Negative for irritability.  HENT:  Negative for sore throat.   Respiratory:  Negative for shortness of breath.   Cardiovascular:  Negative for chest pain and palpitations.  Gastrointestinal:  Negative for  nausea.  Genitourinary:  Negative for impotence.  Skin:  Negative for rash.  Neurological:  Positive for dizziness.  Psychiatric/Behavioral:  Negative for decreased concentration and suicidal ideas. The patient is nervous/anxious. The patient does not have insomnia.    Per HPI unless specifically indicated above     Objective:    There were no vitals taken for this visit.  Wt Readings from Last 3 Encounters:  12/22/21 257 lb 3.2 oz (116.7 kg)  11/10/21 257 lb (116.6 kg)  10/22/21 253 lb (114.8 kg)    Physical Exam  Results for orders placed or performed in visit on 01/12/22  TSH  Result Value Ref Range   TSH 1.170 0.450 - 4.500 uIU/mL  Lipid panel  Result Value Ref Range   Cholesterol, Total 122 100 - 199 mg/dL   Triglycerides 127 0 - 149 mg/dL   HDL 29 (L) >39 mg/dL   VLDL Cholesterol Cal 23 5 - 40 mg/dL   LDL Chol Calc (NIH) 70 0 - 99 mg/dL   Chol/HDL Ratio 4.2 0.0 - 5.0 ratio  Comprehensive metabolic panel  Result Value Ref Range   Glucose 96 70 - 99 mg/dL   BUN 11 6 - 24 mg/dL   Creatinine, Ser 1.05 0.76 - 1.27 mg/dL   eGFR 85 >59 mL/min/1.73   BUN/Creatinine Ratio 10 9 - 20   Sodium 140 134 - 144 mmol/L   Potassium 4.3 3.5 - 5.2 mmol/L   Chloride 102 96 -  106 mmol/L   CO2 23 20 - 29 mmol/L   Calcium 10.2 8.7 - 10.2 mg/dL   Total Protein 6.9 6.0 - 8.5 g/dL   Albumin 4.5 3.8 - 4.9 g/dL   Globulin, Total 2.4 1.5 - 4.5 g/dL   Albumin/Globulin Ratio 1.9 1.2 - 2.2   Bilirubin Total 0.4 0.0 - 1.2 mg/dL   Alkaline Phosphatase 77 44 - 121 IU/L   AST 25 0 - 40 IU/L   ALT 34 0 - 44 IU/L  CBC with Differential/Platelet  Result Value Ref Range   WBC 7.4 3.4 - 10.8 x10E3/uL   RBC 5.06 4.14 - 5.80 x10E6/uL   Hemoglobin 13.9 13.0 - 17.7 g/dL   Hematocrit 53.7 03.1 - 51.0 %   MCV 80 79 - 97 fL   MCH 27.5 26.6 - 33.0 pg   MCHC 34.5 31.5 - 35.7 g/dL   RDW 97.1 26.6 - 13.5 %   Platelets 316 150 - 450 x10E3/uL   Neutrophils 44 Not Estab. %   Lymphs 45 Not Estab. %    Monocytes 8 Not Estab. %   Eos 2 Not Estab. %   Basos 1 Not Estab. %   Neutrophils Absolute 3.2 1.4 - 7.0 x10E3/uL   Lymphocytes Absolute 3.4 (H) 0.7 - 3.1 x10E3/uL   Monocytes Absolute 0.6 0.1 - 0.9 x10E3/uL   EOS (ABSOLUTE) 0.2 0.0 - 0.4 x10E3/uL   Basophils Absolute 0.1 0.0 - 0.2 x10E3/uL   Immature Granulocytes 0 Not Estab. %   Immature Grans (Abs) 0.0 0.0 - 0.1 x10E3/uL        Current Outpatient Medications:    atorvastatin (LIPITOR) 20 MG tablet, Take 1 tablet (20 mg total) by mouth daily., Disp: 30 tablet, Rfl: 3   citalopram (CELEXA) 10 MG tablet, Take 1 tablet (10 mg total) by mouth daily. Take 1 tab x 2 weeks then 2 tabs x 2 weeks and rtc., Disp: 60 tablet, Rfl: 1   clonazePAM (KLONOPIN) 1 MG tablet, TAKE 1 TABLET BY MOUTH DAILY AS NEEDED, Disp: 31 tablet, Rfl: 2   esomeprazole (NEXIUM) 20 MG capsule, Take 1 capsule (20 mg total) by mouth every morning., Disp: 90 capsule, Rfl: 0   fenofibrate 160 MG tablet, Take 1 tablet (160 mg total) by mouth daily., Disp: 30 tablet, Rfl: 6   meclizine (ANTIVERT) 25 MG tablet, Take 1 tablet (25 mg total) by mouth 3 (three) times daily as needed for dizziness., Disp: 30 tablet, Rfl: 0   ondansetron (ZOFRAN) 8 MG tablet, Take 1 tablet (8 mg total) by mouth every 8 (eight) hours as needed for nausea or vomiting., Disp: 20 tablet, Rfl: 0    Assessment & Plan:  HLD Is on fenofibrate and lipitor  recheck FLP, check LFT's work on diet, SE of meds explained to pt. low fat and high fiber diet explained to pt.   Dizziness needs to be seen by neuro didn't have MRI Continues to have this will need to fu with neuro for such. Start pt on meclizine. ? Vertigo   Anxiety restart celexa  Cut back on klonipin  Pt has been on 1 mg of such cut back to 0.5 but needs to restart celexa.     Problem List Items Addressed This Visit       Other   Anxiety     No orders of the defined types were placed in this encounter.    No orders of the defined  types were placed in this encounter.  Follow up plan: No follow-ups on file.     This visit was completed via video visit through MyChart due to the restrictions of the COVID-19 pandemic. All issues as above were discussed and addressed. Physical exam was done as above through visual confirmation on video through MyChart. If it was felt that the patient should be evaluated in the office, they were directed there. The patient verbally consented to this visit. Location of the patient: home Location of the provider: work Those involved with this call:  Provider: Charlynne Cousins, MD CMA: Frazier Butt, [place] Desk/Registration: Myrlene Broker  Time spent on call:  10 minutes with patient face to face via video conference. More than 50% of this time was spent in counseling and coordination of care. 10 minutes total spent in review of patient's record and preparation of their chart.

## 2022-01-19 NOTE — Telephone Encounter (Signed)
[facility] about patient's referral. They stated that all he has to do is call them and they will set up appt with first available. Called patient and her verbalized understand that he just needs to call them and set up his appt.

## 2022-02-11 ENCOUNTER — Other Ambulatory Visit: Payer: Self-pay | Admitting: Internal Medicine

## 2022-02-11 DIAGNOSIS — F419 Anxiety disorder, unspecified: Secondary | ICD-10-CM

## 2022-02-11 NOTE — Telephone Encounter (Signed)
Medication: clonazePAM (KLONOPIN) 1 MG tablet [[id number]]   Has the patient contacted their pharmacy? Yes advised to contact the office (Agent: If no, request that the patient contact the pharmacy for the refill. If patient does not wish to contact the pharmacy document the reason why and proceed with request.) (Agent: If yes, when and what did the pharmacy advise?)  Preferred Pharmacy (with phone number or street name): [place], [place] [address] Phone: [phone] Fax: [phone] Hours: Not open 24 hours   Has the patient been seen for an appointment in the last year OR does the patient have an upcoming appointment? YES 12/22/21  Agent: Please be advised that RX refills may take up to 3 business days. We ask that you follow-up with your pharmacy.

## 2022-02-11 NOTE — Telephone Encounter (Signed)
Requested medication (s) are due for refill today: yes  Requested medication (s) are on the active medication list: yes  Last refill:  10/26/21 #31 with 2 RF  Future visit scheduled: no  Notes to clinic:  Was asked on 12/22/21 visit and 01/19/22 visit to decrease this to 1/2 tab a day, no upcoming visit scheduled, not delegated, please assess.      Requested Prescriptions  Pending Prescriptions Disp Refills   clonazePAM (KLONOPIN) 1 MG tablet 31 tablet 2    Sig: TAKE 1 TABLET BY MOUTH DAILY AS NEEDED     Not Delegated - Psychiatry: Anxiolytics/Hypnotics 2 Failed - 02/11/2022  4:59 PM      Failed - This refill cannot be delegated      Passed - Urine Drug Screen completed in last 360 days      Passed - Patient is not pregnant      Passed - Valid encounter within last 6 months    Recent Outpatient Visits           3 weeks ago [place], MD   1 month ago [place], MD   3 months ago Prediabetes   [facility] Vigg, Avanti, MD   3 months ago Light headed   [facility], Lauren A, NP   4 months ago Erectile dysfunction, unspecified erectile dysfunction type   [facility] Vigg, Avanti, MD

## 2022-02-11 NOTE — Telephone Encounter (Signed)
Requested medication (s) are due for refill today: yes  Requested medication (s) are on the active medication list: yes  Last refill:  10/26/21 #31 with 2 refills  Future visit scheduled: no  Notes to clinic:  Please review for refill. Refill not delegated per protocol    Requested Prescriptions  Pending Prescriptions Disp Refills   clonazePAM (KLONOPIN) 1 MG tablet [Pharmacy Med Name: CLONAZEPAM 1 MG TABLET] 30 tablet 0    Sig: TAKE ONE TABLET DAILY AS NEEDED.     Not Delegated - Psychiatry: Anxiolytics/Hypnotics 2 Failed - 02/11/2022  3:15 PM      Failed - This refill cannot be delegated      Passed - Urine Drug Screen completed in last 360 days      Passed - Patient is not pregnant      Passed - Valid encounter within last 6 months    Recent Outpatient Visits           3 weeks ago [name], Johnny Rios   1 month ago [place], Johnny Rios   3 months ago Johnny Rios   Johnny Rios, Avanti, Johnny Rios   3 months ago Johnny Rios   Johnny Rios, Johnny Rios, Johnny Rios   4 months ago Johnny Rios, Johnny Rios   Johnny Rios, Avanti, Johnny Rios

## 2022-03-01 ENCOUNTER — Other Ambulatory Visit: Payer: Self-pay | Admitting: Physician Assistant

## 2022-03-01 DIAGNOSIS — R42 Dizziness and giddiness: Secondary | ICD-10-CM

## 2022-03-14 ENCOUNTER — Other Ambulatory Visit: Payer: Self-pay | Admitting: Internal Medicine

## 2022-03-14 ENCOUNTER — Other Ambulatory Visit: Payer: Self-pay

## 2022-03-14 DIAGNOSIS — F419 Anxiety disorder, unspecified: Secondary | ICD-10-CM

## 2022-03-14 MED ORDER — CLONAZEPAM 1 MG PO TABS: ORAL_TABLET | ORAL | 0 refills | Status: DC

## 2022-03-14 NOTE — Telephone Encounter (Signed)
Copied from [place] [phone]. Topic: Quick Communication - Rx Refill/Question ?>> Mar 14, 2022  8:36 AM Leward Quan A wrote: ?Medication: clonazePAM (KLONOPIN) 1 MG tablet ?Per patient need more than one refill and need this today please advise  ?Has the patient contacted their pharmacy? No.controlled substance  ?(Agent: If no, request that the patient contact the pharmacy for the refill. If patient does not wish to contact the pharmacy document the reason why and proceed with request.) ?(Agent: If yes, when and what did the pharmacy advise?) ? ?Preferred Pharmacy (with phone number or street name): [place], [place]  ?Phone:  [phone] ?Fax:  [phone] ? ? ? ?Has the patient been seen for an appointment in the last year OR does the patient have an upcoming appointment? Yes.   ? ?Agent: Please be advised that RX refills may take up to 3 business days. We ask that you follow-up with your pharmacy. ?

## 2022-03-14 NOTE — Telephone Encounter (Signed)
Last visit 128/23 ? ?No up coming visit noted at this time.  ?

## 2022-03-14 NOTE — Telephone Encounter (Signed)
Pt called in stating he only has two left, and requesting if it could be sent over as soon as possible, please advise.  ?

## 2022-03-15 MED ORDER — CLONAZEPAM 1 MG PO TABS: ORAL_TABLET | ORAL | 0 refills | Status: DC

## 2022-03-26 ENCOUNTER — Other Ambulatory Visit: Payer: Self-pay | Admitting: Internal Medicine

## 2022-03-26 DIAGNOSIS — K279 Peptic ulcer, site unspecified, unspecified as acute or chronic, without hemorrhage or perforation: Secondary | ICD-10-CM

## 2022-03-28 NOTE — Telephone Encounter (Signed)
Requested Prescriptions  ?Pending Prescriptions Disp Refills  ?? esomeprazole (NEXIUM) 20 MG capsule [Pharmacy Med Name: ESOMEPRAZOLE MAG DR 20 MG CAP] 90 capsule 0  ?  Sig: Take 1 capsule (20 mg total) by mouth every morning.  ?  ? Gastroenterology: Proton Pump Inhibitors 2 Passed - 03/26/2022  8:45 AM  ?  ?  Passed - ALT in normal range and within 360 days  ?  ALT  ?Date Value Ref Range Status  ?01/12/2022 34 0 - 44 IU/L Final  ?   ?  ?  Passed - AST in normal range and within 360 days  ?  AST  ?Date Value Ref Range Status  ?01/12/2022 25 0 - 40 IU/L Final  ?   ?  ?  Passed - Valid encounter within last 12 months  ?  Recent Outpatient Visits   ?      ? 2 months ago Anxiety  ? [facility] Vigg, Avanti, MD  ? 3 months ago Anxiety  ? [facility] Vigg, Avanti, MD  ? 4 months ago Prediabetes  ? [facility] Vigg, Avanti, MD  ? 5 months ago Light headed  ? [place], NP  ? 5 months ago Erectile dysfunction, unspecified erectile dysfunction type  ? [facility] Vigg, Avanti, MD  ?  ?  ? ?  ?  ?  ? ?

## 2022-06-01 ENCOUNTER — Other Ambulatory Visit: Payer: Self-pay | Admitting: Internal Medicine

## 2022-06-01 DIAGNOSIS — F419 Anxiety disorder, unspecified: Secondary | ICD-10-CM

## 2022-06-01 MED ORDER — CLONAZEPAM 1 MG PO TABS: ORAL_TABLET | ORAL | 0 refills | Status: DC

## 2022-06-01 NOTE — Telephone Encounter (Signed)
Requested medication (s) are due for refill today - yes  Requested medication (s) are on the active medication list -yes  Future visit scheduled -no  Last refill: 03/15/22 #30  Notes to clinic: non delegated Rx, patient request 60-90 day supply  Requested Prescriptions  Pending Prescriptions Disp Refills   clonazePAM (KLONOPIN) 1 MG tablet 30 tablet 0    Sig: TAKE ONE TABLET DAILY AS NEEDED.     Not Delegated - Psychiatry: Anxiolytics/Hypnotics 2 Failed - 06/01/2022  9:05 AM      Failed - This refill cannot be delegated      Failed - Urine Drug Screen completed in last 360 days      Passed - Patient is not pregnant      Passed - Valid encounter within last 6 months    Recent Outpatient Visits           4 months ago [place] Vigg, Avanti, MD   5 months ago [place], MD   6 months ago Prediabetes   [facility] Vigg, Avanti, MD   7 months ago Light headed   [employer], Lauren A, NP   7 months ago Erectile dysfunction, unspecified erectile dysfunction type   [place], MD                  Requested Prescriptions  Pending Prescriptions Disp Refills   clonazePAM (KLONOPIN) 1 MG tablet 30 tablet 0    Sig: TAKE ONE TABLET DAILY AS NEEDED.     Not Delegated - Psychiatry: Anxiolytics/Hypnotics 2 Failed - 06/01/2022  9:05 AM      Failed - This refill cannot be delegated      Failed - Urine Drug Screen completed in last 360 days      Passed - Patient is not pregnant      Passed - Valid encounter within last 6 months    Recent Outpatient Visits           4 months ago [place], MD   5 months ago [place], MD   6 months ago Prediabetes   [facility] Vigg, Avanti, MD   7 months ago Light headed   [facility], Lauren A, NP   7  months ago Erectile dysfunction, unspecified erectile dysfunction type   [facility] Vigg, Avanti, MD

## 2022-06-01 NOTE — Telephone Encounter (Signed)
Medication Refill - Medication:  clonazePAM (KLONOPIN) 1 MG tablet  *Pt requested if it could be a 60 or 90 day refill, just more than one refill a month*  Has the patient contacted their pharmacy? Yes.   Contact PCP  Preferred Pharmacy (with phone number or street name):  [place], [place]  [place], [place] [address]  Phone:  [phone]  Fax:  [phone]   Has the patient been seen for an appointment in the last year OR does the patient have an upcoming appointment? Yes.    Agent: Please be advised that RX refills may take up to 3 business days. We ask that you follow-up with your pharmacy.

## 2022-06-21 ENCOUNTER — Other Ambulatory Visit: Payer: Self-pay | Admitting: Internal Medicine

## 2022-06-21 DIAGNOSIS — K279 Peptic ulcer, site unspecified, unspecified as acute or chronic, without hemorrhage or perforation: Secondary | ICD-10-CM

## 2022-07-27 ENCOUNTER — Other Ambulatory Visit: Payer: Self-pay | Admitting: Internal Medicine

## 2022-07-27 DIAGNOSIS — F419 Anxiety disorder, unspecified: Secondary | ICD-10-CM

## 2022-07-27 NOTE — Telephone Encounter (Signed)
Previous Dr. Neomia Dear patient, last visit in January. Has upcoming appointment 08/01/22

## 2022-07-27 NOTE — Telephone Encounter (Signed)
Requested medication (s) are due for refill today - yes  Requested medication (s) are on the active medication list -yes  Future visit scheduled -yes  Last refill: 06/01/22 #30  Notes to clinic: non delegated Rx  Requested Prescriptions  Pending Prescriptions Disp Refills   clonazePAM (KLONOPIN) 1 MG tablet 30 tablet 0    Sig: TAKE ONE TABLET DAILY AS NEEDED.     Not Delegated - Psychiatry: Anxiolytics/Hypnotics 2 Failed - 07/27/2022  2:50 PM      Failed - This refill cannot be delegated      Failed - Urine Drug Screen completed in last 360 days      Failed - Valid encounter within last 6 months    Recent Outpatient Visits           6 months ago [place] Vigg, Avanti, MD   7 months ago [place], MD   8 months ago Prediabetes   [facility] Vigg, Avanti, MD   9 months ago Light headed   [employer], Lauren A, NP   9 months ago Erectile dysfunction, unspecified erectile dysfunction type   [facility] Vigg, Avanti, MD       Future Appointments             In 5 days Jon Billings, NP [place], [place] - Patient is not pregnant         Requested Prescriptions  Pending Prescriptions Disp Refills   clonazePAM (KLONOPIN) 1 MG tablet 30 tablet 0    Sig: TAKE ONE TABLET DAILY AS NEEDED.     Not Delegated - Psychiatry: Anxiolytics/Hypnotics 2 Failed - 07/27/2022  2:50 PM      Failed - This refill cannot be delegated      Failed - Urine Drug Screen completed in last 360 days      Failed - Valid encounter within last 6 months    Recent Outpatient Visits           6 months ago [place], MD   7 months ago [place], MD   8 months ago Prediabetes   [facility] Vigg, Avanti, MD   9 months ago Light headed   [employer], Lauren A, NP   9 months ago Erectile dysfunction, unspecified erectile dysfunction type   [facility] Vigg, Avanti, MD       Future Appointments             In 5 days Jon Billings, NP [facility], [place] - Patient is not pregnant

## 2022-07-27 NOTE — Telephone Encounter (Signed)
Patient states he has 4 pills left and does not want to run out. Informed patient please allow 48 to 72 hour turn around time.    [place], [place] Phone:  [phone]  Fax:  [phone]

## 2022-07-27 NOTE — Telephone Encounter (Signed)
Medication Refill - Medication: clonazePAM (KLONOPIN) 1 MG tablet  Has the patient contacted their pharmacy? No. Pt previously told to contact provider  Preferred Pharmacy (with phone number or street name):  [place], [place] Phone:  [phone]  Fax:  [phone]     Has the patient been seen for an appointment in the last year OR does the patient have an upcoming appointment? Yes.    Agent: Please be advised that RX refills may take up to 3 business days. We ask that you follow-up with your pharmacy.

## 2022-07-28 MED ORDER — CLONAZEPAM 1 MG PO TABS: ORAL_TABLET | ORAL | 0 refills | Status: DC

## 2022-07-28 NOTE — Telephone Encounter (Signed)
Pt would like to know when prescription is sent to pharmacy. Please advise pt once sent.

## 2022-07-28 NOTE — Addendum Note (Signed)
Addended by: Georgina Peer on: 07/28/2022 09:31 AM   Modules accepted: Orders

## 2022-08-01 ENCOUNTER — Ambulatory Visit (INDEPENDENT_AMBULATORY_CARE_PROVIDER_SITE_OTHER): Payer: Commercial Managed Care - HMO | Admitting: Nurse Practitioner

## 2022-08-01 ENCOUNTER — Encounter: Payer: Self-pay | Admitting: Nurse Practitioner

## 2022-08-01 VITALS — BP 127/86 | HR 94 | Temp 98.1°F | Wt 265.9 lb

## 2022-08-01 DIAGNOSIS — E119 Type 2 diabetes mellitus without complications: Secondary | ICD-10-CM

## 2022-08-01 DIAGNOSIS — L918 Other hypertrophic disorders of the skin: Secondary | ICD-10-CM

## 2022-08-01 DIAGNOSIS — E118 Type 2 diabetes mellitus with unspecified complications: Secondary | ICD-10-CM | POA: Insufficient documentation

## 2022-08-01 DIAGNOSIS — F419 Anxiety disorder, unspecified: Secondary | ICD-10-CM

## 2022-08-01 DIAGNOSIS — Z79899 Other long term (current) drug therapy: Secondary | ICD-10-CM

## 2022-08-01 DIAGNOSIS — E782 Mixed hyperlipidemia: Secondary | ICD-10-CM

## 2022-08-01 MED ORDER — CLONAZEPAM 1 MG PO TABS: ORAL_TABLET | ORAL | 0 refills | Status: DC

## 2022-08-01 MED ORDER — ATORVASTATIN CALCIUM 20 MG PO TABS: 20.0000 mg | ORAL_TABLET | Freq: Every day | ORAL | 1 refills | Status: DC

## 2022-08-01 MED ORDER — CLONAZEPAM 1 MG PO TABS: ORAL_TABLET | ORAL | 1 refills | Status: DC

## 2022-08-01 MED ORDER — FENOFIBRATE 160 MG PO TABS: 160.0000 mg | ORAL_TABLET | Freq: Every day | ORAL | 1 refills | Status: DC

## 2022-08-01 NOTE — Assessment & Plan Note (Signed)
Chronic.  Controlled without medication..  Labs ordered today.  Return to clinic in 6 months for reevaluation.  Call sooner if concerns arise.  ° °

## 2022-08-01 NOTE — Assessment & Plan Note (Signed)
Chronic.  Controlled.  Continue with current medication regimen of Atorvastatin and Fenofibrate.  Labs ordered today.  Return to clinic in 6 months for reevaluation.  Call sooner if concerns arise.   

## 2022-08-01 NOTE — Assessment & Plan Note (Signed)
Chronic.  Controlled.  Continue with current medication regimen of Clonazepam daily.  Controlled substance agreement and UDS obtained at visit today.  Understands risks of taking Benzodiazepine and okay with continuing. Labs ordered today.  Return to clinic in 3 months for reevaluation.  Call sooner if concerns arise.

## 2022-08-01 NOTE — Progress Notes (Signed)
 BP 127/86   Pulse 94   Temp 98.1 F (36.7 C) (Oral)   Wt 265 lb 14.4 oz (120.6 kg)   SpO2 98%   BMI 38.07 kg/m    Subjective:    [name], male    DOB: 05/26/1966, 56 y.o.   MRN: [id number]  HPI: Johnny Rios is a 56 y.o. male  Chief Complaint  Patient presents with   Establish Care   ANXIETY/STRESS Chronic. Well controlled. With the Klonopin.  Patient takes one daily.  Did not like the way the Celexa made him feel.  Would like to continue with the Klonopin.    08/01/2022    3:10 PM 01/19/2022   10:50 AM 12/22/2021    3:01 PM 11/10/2021    3:39 PM 10/22/2021   10:02 AM  Depression screen PHQ 2/9  Decreased Interest 0 0 0 0 0  Down, Depressed, Hopeless 0 0 1 0 0  PHQ - 2 Score 0 0 1 0 0  Altered sleeping 0 0 0 0 0  Tired, decreased energy 1 0 1 1 1  Change in appetite 0 0 0 0 0  Feeling bad or failure about yourself  0 0 0 0 0  Trouble concentrating 0 0 0 0 0  Moving slowly or fidgety/restless 0 0 0 0 0  Suicidal thoughts 0   0 0  PHQ-9 Score 1 0 2 1 1  Difficult doing work/chores Not difficult at all   Not difficult at all Not difficult at all     HYPERLIPIDEMIA Hyperlipidemia status: excellent compliance Satisfied with current treatment?  no Side effects:  no Medication compliance: excellent compliance Past cholesterol meds: atorvastain (lipitor) and fenofibrate (tricor) Supplements: none Aspirin:  no The ASCVD Risk score (Arnett DK, et al., 2019) failed to calculate for the following reasons:   The valid total cholesterol range is 130 to 320 mg/dL Chest pain:  no Coronary artery disease:  no Family history CAD:  no Family history early CAD:  no  DIABETES Hypoglycemic episodes:no Polydipsia/polyuria: no Visual disturbance: no Chest pain: no Paresthesias: no Glucose Monitoring: no  Accucheck frequency: Not Checking  Fasting glucose:  Post prandial:  Evening:  Before meals: Taking Insulin?: no  Long acting insulin:  Short  acting insulin: Blood Pressure Monitoring: not checking Retinal Examination: Not up to Date Foot Exam: Up to Date Diabetic Education: Not Completed Pneumovax: Not up to Date Influenza: Not up to Date Aspirin: no   Relevant past medical, surgical, family and social history reviewed and updated as indicated. Interim medical history since our last visit reviewed. Allergies and medications reviewed and updated.  Review of Systems  Eyes:  Negative for visual disturbance.  Respiratory:  Negative for chest tightness and shortness of breath.   Cardiovascular:  Negative for chest pain, palpitations and leg swelling.  Endocrine: Negative for polydipsia and polyuria.  Neurological:  Negative for dizziness, light-headedness, numbness and headaches.  Psychiatric/Behavioral:  The patient is nervous/anxious.     Per HPI unless specifically indicated above     Objective:    BP 127/86   Pulse 94   Temp 98.1 F (36.7 C) (Oral)   Wt 265 lb 14.4 oz (120.6 kg)   SpO2 98%   BMI 38.07 kg/m   Wt Readings from Last 3 Encounters:  08/01/22 265 lb 14.4 oz (120.6 kg)  12/22/21 257 lb 3.2 oz (116.7 kg)  11/10/21 257 lb (116.6 kg)    Physical Exam Vitals and   nursing note reviewed.  Constitutional:      General: He is not in acute distress.    Appearance: Normal appearance. He is obese. He is not ill-appearing, toxic-appearing or diaphoretic.  HENT:     Head: Normocephalic.     Right Ear: External ear normal.     Left Ear: External ear normal.     Nose: Nose normal. No congestion or rhinorrhea.     Mouth/Throat:     Mouth: Mucous membranes are moist.  Eyes:     General:        Right eye: No discharge.        Left eye: No discharge.     Extraocular Movements: Extraocular movements intact.     Conjunctiva/sclera: Conjunctivae normal.     Pupils: Pupils are equal, round, and reactive to light.  Cardiovascular:     Rate and Rhythm: Normal rate and regular rhythm.     Heart sounds: No murmur  heard. Pulmonary:     Effort: Pulmonary effort is normal. No respiratory distress.     Breath sounds: Normal breath sounds. No wheezing, rhonchi or rales.  Abdominal:     General: Abdomen is flat. Bowel sounds are normal.  Musculoskeletal:     Cervical back: Normal range of motion and neck supple.  Skin:    General: Skin is warm and dry.     Capillary Refill: Capillary refill takes less than 2 seconds.  Neurological:     General: No focal deficit present.     Mental Status: He is alert and oriented to person, place, and time.  Psychiatric:        Mood and Affect: Mood normal.        Behavior: Behavior normal.        Thought Content: Thought content normal.        Judgment: Judgment normal.     Results for orders placed or performed in visit on 01/12/22  TSH  Result Value Ref Range   TSH 1.170 0.450 - 4.500 uIU/mL  Lipid panel  Result Value Ref Range   Cholesterol, Total 122 100 - 199 mg/dL   Triglycerides 127 0 - 149 mg/dL   HDL 29 (L) >39 mg/dL   VLDL Cholesterol Cal 23 5 - 40 mg/dL   LDL Chol Calc (NIH) 70 0 - 99 mg/dL   Chol/HDL Ratio 4.2 0.0 - 5.0 ratio  Comprehensive metabolic panel  Result Value Ref Range   Glucose 96 70 - 99 mg/dL   BUN 11 6 - 24 mg/dL   Creatinine, Ser 1.05 0.76 - 1.27 mg/dL   eGFR 85 >59 mL/min/1.73   BUN/Creatinine Ratio 10 9 - 20   Sodium 140 134 - 144 mmol/L   Potassium 4.3 3.5 - 5.2 mmol/L   Chloride 102 96 - 106 mmol/L   CO2 23 20 - 29 mmol/L   Calcium 10.2 8.7 - 10.2 mg/dL   Total Protein 6.9 6.0 - 8.5 g/dL   Albumin 4.5 3.8 - 4.9 g/dL   Globulin, Total 2.4 1.5 - 4.5 g/dL   Albumin/Globulin Ratio 1.9 1.2 - 2.2   Bilirubin Total 0.4 0.0 - 1.2 mg/dL   Alkaline Phosphatase 77 44 - 121 IU/L   AST 25 0 - 40 IU/L   ALT 34 0 - 44 IU/L  CBC with Differential/Platelet  Result Value Ref Range   WBC 7.4 3.4 - 10.8 x10E3/uL   RBC 5.06 4.14 - 5.80 x10E6/uL   Hemoglobin 13.9 13.0 - 17.7 g/dL     Hematocrit 40.3 37.5 - 51.0 %   MCV 80 79 - 97  fL   MCH 27.5 26.6 - 33.0 pg   MCHC 34.5 31.5 - 35.7 g/dL   RDW 12.3 11.6 - 15.4 %   Platelets 316 150 - 450 x10E3/uL   Neutrophils 44 Not Estab. %   Lymphs 45 Not Estab. %   Monocytes 8 Not Estab. %   Eos 2 Not Estab. %   Basos 1 Not Estab. %   Neutrophils Absolute 3.2 1.4 - 7.0 x10E3/uL   Lymphocytes Absolute 3.4 (H) 0.7 - 3.1 x10E3/uL   Monocytes Absolute 0.6 0.1 - 0.9 x10E3/uL   EOS (ABSOLUTE) 0.2 0.0 - 0.4 x10E3/uL   Basophils Absolute 0.1 0.0 - 0.2 x10E3/uL   Immature Granulocytes 0 Not Estab. %   Immature Grans (Abs) 0.0 0.0 - 0.1 x10E3/uL      Assessment & Plan:   Problem List Items Addressed This Visit       Endocrine   Diabetes mellitus without complication (HCC)    Chronic.  Controlled without medication.  Labs ordered today.  Return to clinic in 6 months for reevaluation.  Call sooner if concerns arise.        Relevant Medications   atorvastatin (LIPITOR) 20 MG tablet   Other Relevant Orders   Comp Met (CMET)   HgB A1c   Microalbumin, Urine Waived     Other   Anxiety    Chronic.  Controlled.  Continue with current medication regimen of Clonazepam daily.  Controlled substance agreement and UDS obtained at visit today.  Understands risks of taking Benzodiazepine and okay with continuing. Labs ordered today.  Return to clinic in 3 months for reevaluation.  Call sooner if concerns arise.        Relevant Medications   clonazePAM (KLONOPIN) 1 MG tablet (Start on 08/31/2022)   Mixed hyperlipidemia - Primary    Chronic.  Controlled.  Continue with current medication regimen of Atorvastatin and Fenofibrate.  Labs ordered today.  Return to clinic in 6 months for reevaluation.  Call sooner if concerns arise.        Relevant Medications   atorvastatin (LIPITOR) 20 MG tablet   fenofibrate 160 MG tablet   Other Relevant Orders   Lipid Profile   Other Visit Diagnoses     Controlled substance agreement signed       Relevant Orders   [id number] 11+Oxyco+Alc+Crt-Bund    Skin tag       Relevant Orders   Ambulatory referral to Dermatology        Follow up plan: Return in about 3 months (around 11/01/2022) for Depression/Anxiety FU.      

## 2022-08-02 LAB — COMPREHENSIVE METABOLIC PANEL
ALT: 30 IU/L (ref 0–44)
AST: 21 IU/L (ref 0–40)
Albumin/Globulin Ratio: 1.8 (ref 1.2–2.2)
Albumin: 4.4 g/dL (ref 3.8–4.9)
Alkaline Phosphatase: 80 IU/L (ref 44–121)
BUN/Creatinine Ratio: 14 (ref 9–20)
BUN: 15 mg/dL (ref 6–24)
Bilirubin Total: 0.4 mg/dL (ref 0.0–1.2)
CO2: 22 mmol/L (ref 20–29)
Calcium: 9.8 mg/dL (ref 8.7–10.2)
Chloride: 105 mmol/L (ref 96–106)
Creatinine, Ser: 1.04 mg/dL (ref 0.76–1.27)
Globulin, Total: 2.5 g/dL (ref 1.5–4.5)
Glucose: 108 mg/dL — ABNORMAL HIGH (ref 70–99)
Potassium: 4.3 mmol/L (ref 3.5–5.2)
Sodium: 141 mmol/L (ref 134–144)
Total Protein: 6.9 g/dL (ref 6.0–8.5)
eGFR: 85 mL/min/{1.73_m2} (ref >59)

## 2022-08-02 LAB — HEMOGLOBIN A1C
Est. average glucose Bld gHb Est-mCnc: 140 mg/dL
Hgb A1c MFr Bld: 6.5 % — ABNORMAL HIGH (ref 4.8–5.6)

## 2022-08-02 LAB — LIPID PANEL
Chol/HDL Ratio: 4.8 ratio (ref 0.0–5.0)
Cholesterol, Total: 133 mg/dL (ref 100–199)
HDL: 28 mg/dL — ABNORMAL LOW (ref >39)
LDL Chol Calc (NIH): 83 mg/dL (ref 0–99)
Triglycerides: 121 mg/dL (ref 0–149)
VLDL Cholesterol Cal: 22 mg/dL (ref 5–40)

## 2022-08-02 NOTE — Progress Notes (Signed)
Hi Archit. It was nice to meet you yesterday. Your A1c is 6.5.  Keep up the good work.  Your other lab work looks good.  No concerns at this time. Continue with your current medication regimen.  Follow up as discussed.  Please let me know if you have any questions.

## 2022-08-03 LAB — DRUG SCREEN 764883 11+OXYCO+ALC+CRT-BUND
Amphetamines, Urine: NEGATIVE ng/mL
BENZODIAZ UR QL: NEGATIVE ng/mL
Barbiturate: NEGATIVE ng/mL
Cannabinoid Quant, Ur: NEGATIVE ng/mL
Cocaine (Metabolite): NEGATIVE ng/mL
Creatinine: 178.7 mg/dL (ref 20.0–300.0)
Ethanol: NEGATIVE %
Meperidine: NEGATIVE ng/mL
Methadone Screen, Urine: NEGATIVE ng/mL
OPIATE SCREEN URINE: NEGATIVE ng/mL
Oxycodone/Oxymorphone, Urine: NEGATIVE ng/mL
Phencyclidine: NEGATIVE ng/mL
Propoxyphene: NEGATIVE ng/mL
Tramadol: NEGATIVE ng/mL
pH, Urine: 6.9 (ref 4.5–8.9)

## 2022-08-03 NOTE — Progress Notes (Signed)
HI Johnny Rios. Your urine drug screen looks good.  I will see you at our next visit.

## 2022-08-24 ENCOUNTER — Encounter: Payer: Self-pay | Admitting: Nurse Practitioner

## 2022-09-15 ENCOUNTER — Other Ambulatory Visit: Payer: Self-pay | Admitting: Nurse Practitioner

## 2022-09-15 DIAGNOSIS — K279 Peptic ulcer, site unspecified, unspecified as acute or chronic, without hemorrhage or perforation: Secondary | ICD-10-CM

## 2022-09-15 NOTE — Telephone Encounter (Signed)
Copied from [place] [phone]. Topic: General - Other >> Sep 15, 2022  8:46 AM Everette C wrote: Reason for CRM: Medication Refill - Medication: esomeprazole (NEXIUM) 20 MG capsule [[id number]]   Has the patient contacted their pharmacy? Yes.   (Agent: If no, request that the patient contact the pharmacy for the refill. If patient does not wish to contact the pharmacy document the reason why and proceed with request.) (Agent: If yes, when and what did the pharmacy advise?)  Preferred Pharmacy (with phone number or street name): [facility], [place] [address] Phone: [phone] Fax: [phone] Hours: Not open 24 hours   Has the patient been seen for an appointment in the last year OR does the patient have an upcoming appointment? Yes.    Agent: Please be advised that RX refills may take up to 3 business days. We ask that you follow-up with your pharmacy.

## 2022-09-15 NOTE — Telephone Encounter (Signed)
rx was sent to pharmacy on 06/21/22 #90/2.  Requested Prescriptions  Pending Prescriptions Disp Refills  . esomeprazole (NEXIUM) 20 MG capsule 90 capsule 2    Sig: Take 1 capsule (20 mg total) by mouth every morning.     Gastroenterology: Proton Pump Inhibitors 2 Passed - 09/15/2022 10:20 AM      Passed - ALT in normal range and within 360 days    ALT  Date Value Ref Range Status  08/01/2022 30 0 - 44 IU/L Final         Passed - AST in normal range and within 360 days    AST  Date Value Ref Range Status  08/01/2022 21 0 - 40 IU/L Final         Passed - Valid encounter within last 12 months    Recent Outpatient Visits          1 month ago Mixed hyperlipidemia   [facility] Jon Billings, NP   7 months ago [place], MD   8 months ago [place], MD   10 months ago Prediabetes   [facility] Vigg, Avanti, MD   10 months ago Light headed   [employer], Scheryl Darter, NP      Future Appointments            In 1 month Jon Billings, [place], [place]   In 7 months Ralene Bathe, MD [place]

## 2022-09-26 ENCOUNTER — Other Ambulatory Visit: Payer: Self-pay | Admitting: Nurse Practitioner

## 2022-09-26 DIAGNOSIS — F419 Anxiety disorder, unspecified: Secondary | ICD-10-CM

## 2022-09-26 NOTE — Telephone Encounter (Signed)
Requested medication (s) are due for refill today: YES - the receipt received is from prev rx in Aug. [place] 9/6 WAS NOT RECEIVED. PLEASE SEE DATE BESIDE THE RECEIVED MESSAGE, IT IS FROM AUG  Requested medication (s) are on the active medication list: YES  Last refill:  Written 9/6 but not received, last received refill was 08/01/22  Future visit scheduled: 11/02/22  Notes to clinic:  [place] IS NOT FOR THE RX SENT 9/6, IT IS FROM PREVIOUS RX.      Requested Prescriptions  Pending Prescriptions Disp Refills   clonazePAM (KLONOPIN) 1 MG tablet 31 tablet 1    Sig: TAKE ONE TABLET DAILY AS NEEDED.     Not Delegated - Psychiatry: Anxiolytics/Hypnotics 2 Failed - 09/26/2022  1:56 PM      Failed - This refill cannot be delegated      Passed - Urine Drug Screen completed in last 360 days      Passed - Patient is not pregnant      Passed - Valid encounter within last 6 months    Recent Outpatient Visits           1 month ago Mixed hyperlipidemia   [place], NP   8 months ago [place], MD   9 months ago [place], MD   10 months ago Prediabetes   [facility] Vigg, Avanti, MD   11 months ago Light headed   [employer], Scheryl Darter, NP       Future Appointments             In 1 month Jon Billings, [place], [place]   In 7 months Ralene Bathe, MD [place]

## 2022-09-26 NOTE — Telephone Encounter (Signed)
Medication Refill - Medication: clonazePAM (KLONOPIN) 1 MG tablet / pt asked if these can be refilled today / please advise  Pt has 3 pills left   Has the patient contacted their pharmacy? No. (Agent: If no, request that the patient contact the pharmacy for the refill. If patient does not wish to contact the pharmacy document the reason why and proceed with request.) (Agent: If yes, when and what did the pharmacy advise?)  Preferred Pharmacy (with phone number or street name): [facility]   Has the patient been seen for an appointment in the last year OR does the patient have an upcoming appointment? Yes.    Agent: Please be advised that RX refills may take up to 3 business days. We ask that you follow-up with your pharmacy.

## 2022-09-27 MED ORDER — CLONAZEPAM 1 MG PO TABS: ORAL_TABLET | ORAL | 0 refills | Status: DC

## 2022-10-24 ENCOUNTER — Other Ambulatory Visit: Payer: Self-pay | Admitting: Nurse Practitioner

## 2022-10-24 DIAGNOSIS — F419 Anxiety disorder, unspecified: Secondary | ICD-10-CM

## 2022-10-25 NOTE — Telephone Encounter (Signed)
   Notes to clinic: Duplicate request  Requested Prescriptions  Pending Prescriptions Disp Refills   clonazePAM (KLONOPIN) 1 MG tablet [Pharmacy Med Name: CLONAZEPAM 1 MG TABLET] 30 tablet 0    Sig: TAKE ONE TABLET DAILY AS NEEDED.     Not Delegated - Psychiatry: Anxiolytics/Hypnotics 2 Failed - 10/25/2022  8:14 AM      Failed - This refill cannot be delegated      Passed - Urine Drug Screen completed in last 360 days      Passed - Patient is not pregnant      Passed - Valid encounter within last 6 months    Recent Outpatient Visits           2 months ago Mixed hyperlipidemia   [facility] Jon Billings, NP   9 months ago [place], MD   10 months ago [place], MD   11 months ago Prediabetes   [facility] Vigg, Customer service manager, MD   1 year ago Light headed   [facility] Charyl Dancer, NP       Future Appointments             In 1 week Jon Billings, NP [employer], [place]   In 6 months Ralene Bathe, MD [place]               Requested Prescriptions  Pending Prescriptions Disp Refills   clonazePAM (KLONOPIN) 1 MG tablet [Pharmacy Med Name: CLONAZEPAM 1 MG TABLET] 30 tablet 0    Sig: TAKE ONE TABLET DAILY AS NEEDED.     Not Delegated - Psychiatry: Anxiolytics/Hypnotics 2 Failed - 10/25/2022  8:14 AM      Failed - This refill cannot be delegated      Passed - Urine Drug Screen completed in last 360 days      Passed - Patient is not pregnant      Passed - Valid encounter within last 6 months    Recent Outpatient Visits           2 months ago Mixed hyperlipidemia   [facility] Jon Billings, NP   9 months ago [place], MD   10 months ago [place], MD   11 months ago Prediabetes   [facility] Vigg,  Avanti, MD   1 year ago Light headed   [facility] Charyl Dancer, NP       Future Appointments             In 1 week Jon Billings, NP [facility], [place]   In 6 months Ralene Bathe, MD [place]

## 2022-10-25 NOTE — Telephone Encounter (Signed)
Requested medication (s) are due for refill today - yes  Requested medication (s) are on the active medication list -yes  Future visit scheduled -yes  Last refill: 09/27/22 #31  Notes to clinic: non delegated Rx  Requested Prescriptions  Pending Prescriptions Disp Refills   clonazePAM (KLONOPIN) 1 MG tablet [Pharmacy Med Name: CLONAZEPAM 1 MG TABLET] 30 tablet 0    Sig: TAKE ONE TABLET DAILY AS NEEDED.     Not Delegated - Psychiatry: Anxiolytics/Hypnotics 2 Failed - 10/25/2022  8:14 AM      Failed - This refill cannot be delegated      Passed - Urine Drug Screen completed in last 360 days      Passed - Patient is not pregnant      Passed - Valid encounter within last 6 months    Recent Outpatient Visits           2 months ago Mixed hyperlipidemia   [facility] Jon Billings, NP   9 months ago [place], MD   10 months ago [place], MD   11 months ago Prediabetes   [facility] Vigg, Customer service manager, [place]   1 year ago Light headed   [facility] Charyl Dancer, NP       Future Appointments             In 1 week Jon Billings, NP [employer], [place]   In 6 months Ralene Bathe, MD [place]               Requested Prescriptions  Pending Prescriptions Disp Refills   clonazePAM (KLONOPIN) 1 MG tablet [Pharmacy Med Name: CLONAZEPAM 1 MG TABLET] 30 tablet 0    Sig: TAKE ONE TABLET DAILY AS NEEDED.     Not Delegated - Psychiatry: Anxiolytics/Hypnotics 2 Failed - 10/25/2022  8:14 AM      Failed - This refill cannot be delegated      Passed - Urine Drug Screen completed in last 360 days      Passed - Patient is not pregnant      Passed - Valid encounter within last 6 months    Recent Outpatient Visits           2 months ago Mixed hyperlipidemia   [facility] Jon Billings, NP   9 months ago [place], MD   10 months ago [place], MD   11 months ago Prediabetes   [facility] Vigg, Avanti, MD   1 year ago Light headed   [facility] Charyl Dancer, NP       Future Appointments             In 1 week Jon Billings, NP [facility], [place]   In 6 months Ralene Bathe, MD [place]

## 2022-10-25 NOTE — Telephone Encounter (Signed)
Pt is calling to follow up on medication refill.  Pt is scheduled to come in 11/02/2022 pt has 3 tablets left.

## 2022-10-26 ENCOUNTER — Telehealth: Payer: Self-pay | Admitting: Nurse Practitioner

## 2022-10-26 NOTE — Telephone Encounter (Signed)
Copied from [place] [id number]. Topic: General - Other >> Oct 26, 2022 12:16 PM Everette C wrote: Reason for CRM: The patient would like to speak with a member of clinical staff about their request for clonazePAM (KLONOPIN) 1 MG tablet [[id number]]   The patient has been directed by their pharmacy to contact their PCP and follow up on their request   The patient has stressed the urgency of their request and shares that they only have three tablets remaining   Please contact further when possible

## 2022-10-26 NOTE — Telephone Encounter (Signed)
Attempted to call patient, [id number]

## 2022-10-26 NOTE — Telephone Encounter (Signed)
Called patient back, he states pharmacy gave him courtesy 3 extra pills. No further action needed.

## 2022-11-01 ENCOUNTER — Ambulatory Visit: Payer: Commercial Managed Care - HMO | Admitting: Unknown Physician Specialty

## 2022-11-02 ENCOUNTER — Encounter: Payer: Self-pay | Admitting: Nurse Practitioner

## 2022-11-02 ENCOUNTER — Ambulatory Visit (INDEPENDENT_AMBULATORY_CARE_PROVIDER_SITE_OTHER): Payer: Commercial Managed Care - HMO | Admitting: Nurse Practitioner

## 2022-11-02 VITALS — BP 112/74 | HR 88 | Temp 97.8°F | Wt 268.5 lb

## 2022-11-02 DIAGNOSIS — F419 Anxiety disorder, unspecified: Secondary | ICD-10-CM | POA: Diagnosis not present

## 2022-11-02 DIAGNOSIS — M545 Low back pain, unspecified: Secondary | ICD-10-CM | POA: Diagnosis not present

## 2022-11-02 DIAGNOSIS — E119 Type 2 diabetes mellitus without complications: Secondary | ICD-10-CM | POA: Diagnosis not present

## 2022-11-02 DIAGNOSIS — G8929 Other chronic pain: Secondary | ICD-10-CM | POA: Diagnosis not present

## 2022-11-02 LAB — MICROALBUMIN, URINE WAIVED
Creatinine, Urine Waived: 300 mg/dL (ref 10–300)
Microalb, Ur Waived: 10 mg/L (ref 0–19)
Microalb/Creat Ratio: <30 mg/g (ref <30)

## 2022-11-02 MED ORDER — CLONAZEPAM 1 MG PO TABS: ORAL_TABLET | ORAL | 2 refills | Status: DC

## 2022-11-02 NOTE — Assessment & Plan Note (Signed)
Chronic, stable.  No changes to current regimen.  Urine microalbumin obtained today. Will contact pt with results.

## 2022-11-02 NOTE — Progress Notes (Addendum)
BP 112/74   Pulse 88   Temp 97.8 F (36.6 C) (Oral)   Wt 121.8 kg   BMI 38.44 kg/m    Subjective:    [name], male    DOB: 12/20/1966, 56 y.o.   MRN: [id number]  NOTE WRITTEN BY DNP STUDENT.  ASSESSMENT AND PLAN OF CARE REVIEWED WITH STUDENT, AGREE WITH ABOVE FINDINGS AND PLAN.   HPI: Johnny Rios is a 56 y.o. male  Pt is complaining of back pain.  Takes aleve and minimally effective. 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  States he is aware he has gained weight but is motivated to get his diet back in order and start increasing his activity. Requesting referral to orthopedics for chronic back pain.   ANXIETY/STRESS Chronic. Well controlled. With the Klonopin.  Patient takes one daily. Does not feel drastically better, but consistent and able to carry on with daily activity, family, and work. Would like to continue with the Klonopin.      11/02/2022    3:28 PM 08/01/2022    3:10 PM 01/19/2022   10:50 AM 12/22/2021    3:01 PM 11/10/2021    3:39 PM  Depression screen PHQ 2/9  Decreased Interest 0 0 0 0 0  Down, Depressed, Hopeless 0 0 0 1 0  PHQ - 2 Score 0 0 0 1 0  Altered sleeping 0 0 0 0 0  Tired, decreased energy 0 1 0 1 1  Change in appetite 0 0 0 0 0  Feeling bad or failure about yourself  0 0 0 0 0  Trouble concentrating 0 0 0 0 0  Moving slowly or fidgety/restless 0 0 0 0 0  Suicidal thoughts 0 0   0  PHQ-9 Score 0 1 0 2 1  Difficult doing work/chores Not difficult at all Not difficult at all   Not difficult at all     Relevant past medical, surgical, family and social history reviewed and updated as indicated. Interim medical history since our last visit reviewed. Allergies and medications reviewed and updated.  Review of Systems  Constitutional:  Positive for fatigue. Negative for activity change and appetite change.  Eyes:  Negative for visual disturbance.  Respiratory:   Negative for chest tightness and shortness of breath.   Cardiovascular:  Negative for chest pain, palpitations and leg swelling.  Endocrine: Negative for polydipsia and polyuria.  Neurological:  Negative for dizziness, light-headedness, numbness and headaches.  Psychiatric/Behavioral:  The patient is nervous/anxious.     Per HPI unless specifically indicated above     Objective:    BP 112/74   Pulse 88   Temp 97.8 F (36.6 C) (Oral)   Wt 121.8 kg   BMI 38.44 kg/m   Wt Readings from Last 3 Encounters:  11/02/22 121.8 kg  08/01/22 120.6 kg  12/22/21 116.7 kg    Physical Exam Vitals and nursing note reviewed.  Constitutional:      General: He is not in acute distress.    Appearance: Normal appearance. He is obese. He is not ill-appearing, toxic-appearing or diaphoretic.  Cardiovascular:     Rate and Rhythm: Normal rate and regular rhythm.     Heart sounds: Normal heart sounds. No murmur heard. Pulmonary:     Effort: Pulmonary effort is normal. No respiratory distress.     Breath sounds: Normal breath sounds. No wheezing, rhonchi or rales.  Skin:  General: Skin is warm and dry.     Capillary Refill: Capillary refill takes less than 2 seconds.  Neurological:     General: No focal deficit present.     Mental Status: He is alert and oriented to person, place, and time.  Psychiatric:        Mood and Affect: Mood normal.        Behavior: Behavior normal.        Thought Content: Thought content normal.        Judgment: Judgment normal.     Results for orders placed or performed in visit on 08/01/22  Comp Met (CMET)  Result Value Ref Range   Glucose 108 (H) 70 - 99 mg/dL   BUN 15 6 - 24 mg/dL   Creatinine, Ser 1.04 0.76 - 1.27 mg/dL   eGFR 85 >59 mL/min/1.73   BUN/Creatinine Ratio 14 9 - 20   Sodium 141 134 - 144 mmol/L   Potassium 4.3 3.5 - 5.2 mmol/L   Chloride 105 96 - 106 mmol/L   CO2 22 20 - 29 mmol/L   Calcium 9.8 8.7 - 10.2 mg/dL   Total Protein 6.9 6.0 -  8.5 g/dL   Albumin 4.4 3.8 - 4.9 g/dL   Globulin, Total 2.5 1.5 - 4.5 g/dL   Albumin/Globulin Ratio 1.8 1.2 - 2.2   Bilirubin Total 0.4 0.0 - 1.2 mg/dL   Alkaline Phosphatase 80 44 - 121 IU/L   AST 21 0 - 40 IU/L   ALT 30 0 - 44 IU/L  Lipid Profile  Result Value Ref Range   Cholesterol, Total 133 100 - 199 mg/dL   Triglycerides 121 0 - 149 mg/dL   HDL 28 (L) >39 mg/dL   VLDL Cholesterol Cal 22 5 - 40 mg/dL   LDL Chol Calc (NIH) 83 0 - 99 mg/dL   Chol/HDL Ratio 4.8 0.0 - 5.0 ratio  HgB A1c  Result Value Ref Range   Hgb A1c MFr Bld 6.5 (H) 4.8 - 5.6 %   Est. average glucose Bld gHb Est-mCnc 140 mg/dL  [id number] 11+Oxyco+Alc+Crt-Bund  Result Value Ref Range   Ethanol Negative Cutoff=0.020 %   Amphetamines, Urine Negative Cutoff=1000 ng/mL   Barbiturate Negative Cutoff=200 ng/mL   BENZODIAZ UR QL Negative Cutoff=200 ng/mL   Cannabinoid Quant, Ur Negative Cutoff=50 ng/mL   Cocaine (Metabolite) Negative Cutoff=300 ng/mL   OPIATE SCREEN URINE Negative Cutoff=300 ng/mL   Oxycodone/Oxymorphone, Urine Negative Cutoff=300 ng/mL   Phencyclidine Negative Cutoff=25 ng/mL   Methadone Screen, Urine Negative Cutoff=300 ng/mL   Propoxyphene Negative Cutoff=300 ng/mL   Meperidine Negative Cutoff=200 ng/mL   Tramadol Negative Cutoff=200 ng/mL   Creatinine 178.7 20.0 - 300.0 mg/dL   pH, Urine 6.9 4.5 - 8.9      Assessment & Plan:   Problem List Items Addressed This Visit       Endocrine   Diabetes mellitus without complication (HCC) - Primary    Chronic, stable.  No changes to current regimen.  Urine microalbumin obtained today. Will contact pt with results.      Relevant Orders   Microalbumin, Urine Waived     Other   Anxiety    Chronic, stable. No changes to current regimen. Pt counseled on avoiding use before/at bedtime. Risks of long-term benzodiazepines discussed. Prescription and refill sent to pharmacy.       Relevant Medications   clonazePAM (KLONOPIN) 1 MG tablet   Other  Visit Diagnoses     Chronic low back pain without  sciatica, unspecified back pain laterality       Relevant Medications   clonazePAM (KLONOPIN) 1 MG tablet   Other Relevant Orders   AMB referral to orthopedics        Follow up plan: Return in about 3 months (around 02/02/2023) for HTN, HLD, DM2 FU.

## 2022-11-02 NOTE — Assessment & Plan Note (Addendum)
Chronic, stable. No changes to current regimen. Pt counseled on avoiding use before/at bedtime. Risks of long-term benzodiazepines discussed. Prescription and refill sent to pharmacy.

## 2022-11-03 NOTE — Progress Notes (Signed)
Hi Johnny Rios.  Your microalbumin is normal which is great.  Your kidneys are happy!

## 2023-01-25 ENCOUNTER — Other Ambulatory Visit: Payer: Self-pay | Admitting: Nurse Practitioner

## 2023-01-25 DIAGNOSIS — F419 Anxiety disorder, unspecified: Secondary | ICD-10-CM

## 2023-01-25 NOTE — Telephone Encounter (Signed)
Requested medication (s) are due for refill today: Yes  Requested medication (s) are on the active medication list: Yes  Last refill:  11/02/22  Future visit scheduled: Yes  Notes to clinic:  Unable to refill per protocol, cannot delegate. Requesting a courtesy refill until upcoming appt     Requested Prescriptions  Pending Prescriptions Disp Refills   clonazePAM (KLONOPIN) 1 MG tablet 31 tablet 2    Sig: TAKE ONE TABLET DAILY AS NEEDED.     Not Delegated - Psychiatry: Anxiolytics/Hypnotics 2 Failed - 01/25/2023  3:45 PM      Failed - This refill cannot be delegated      Passed - Urine Drug Screen completed in last 360 days      Passed - Patient is not pregnant      Passed - Valid encounter within last 6 months    Recent Outpatient Visits           2 months ago Diabetes mellitus without complication ([place])   [place] Jon Billings, NP   5 months ago Mixed hyperlipidemia   [place] Jon Billings, NP   1 year ago [place] Vigg, Avanti, MD   1 year ago [place] Vigg, Avanti, MD   1 year ago Prediabetes   [place] Charlynne Cousins, MD       Future Appointments             In 1 week Jon Billings, [place], [place]   In 3 months Ralene Bathe, MD [place]

## 2023-01-25 NOTE — Telephone Encounter (Signed)
Medication Refill - Medication: clonazePAM (KLONOPIN) 1 MG tablet   Pt asked if it was possible to get a courtesy refill to get him through until his appointment. Stated only has 4 tablets left.  Please advise.    Has the patient contacted their pharmacy? No. (Agent: If no, request that the patient contact the pharmacy for the refill. If patient does not wish to contact the pharmacy document the reason why and proceed with request.)   Preferred Pharmacy (with phone number or street name):  [facility], [address]  [place] [address]  Phone: [phone] Fax: [phone]  Hours: Not open 24 hours   Has the patient been seen for an appointment in the last year OR does the patient have an upcoming appointment? Yes.   02/02/2023  Agent: Please be advised that RX refills may take up to 3 business days. We ask that you follow-up with your pharmacy.

## 2023-01-26 ENCOUNTER — Encounter: Payer: Self-pay | Admitting: Nurse Practitioner

## 2023-01-26 DIAGNOSIS — F419 Anxiety disorder, unspecified: Secondary | ICD-10-CM

## 2023-01-26 NOTE — Telephone Encounter (Signed)
Patient has 2 pills left, requesting a 7 day supply to hold him over until appointment on 02/02/2023  [place], [place] - [place] Phone: [phone]  Fax: [phone]

## 2023-01-27 MED ORDER — CLONAZEPAM 1 MG PO TABS: ORAL_TABLET | ORAL | 0 refills | Status: DC

## 2023-02-02 ENCOUNTER — Encounter: Payer: Self-pay | Admitting: Nurse Practitioner

## 2023-02-02 ENCOUNTER — Ambulatory Visit (INDEPENDENT_AMBULATORY_CARE_PROVIDER_SITE_OTHER): Payer: Commercial Managed Care - HMO | Admitting: Nurse Practitioner

## 2023-02-02 VITALS — BP 124/80 | HR 109 | Temp 98.0°F | Wt 271.8 lb

## 2023-02-02 DIAGNOSIS — R972 Elevated prostate specific antigen [PSA]: Secondary | ICD-10-CM

## 2023-02-02 DIAGNOSIS — E785 Hyperlipidemia, unspecified: Secondary | ICD-10-CM | POA: Diagnosis not present

## 2023-02-02 DIAGNOSIS — E119 Type 2 diabetes mellitus without complications: Secondary | ICD-10-CM

## 2023-02-02 DIAGNOSIS — Z0000 Encounter for general adult medical examination without abnormal findings: Secondary | ICD-10-CM

## 2023-02-02 DIAGNOSIS — F419 Anxiety disorder, unspecified: Secondary | ICD-10-CM

## 2023-02-02 DIAGNOSIS — Z79899 Other long term (current) drug therapy: Secondary | ICD-10-CM

## 2023-02-02 DIAGNOSIS — Z1211 Encounter for screening for malignant neoplasm of colon: Secondary | ICD-10-CM

## 2023-02-02 DIAGNOSIS — K279 Peptic ulcer, site unspecified, unspecified as acute or chronic, without hemorrhage or perforation: Secondary | ICD-10-CM

## 2023-02-02 DIAGNOSIS — E782 Mixed hyperlipidemia: Secondary | ICD-10-CM | POA: Diagnosis not present

## 2023-02-02 MED ORDER — CLONAZEPAM 1 MG PO TABS: ORAL_TABLET | ORAL | 2 refills | Status: DC

## 2023-02-02 MED ORDER — FENOFIBRATE 160 MG PO TABS: 160.0000 mg | ORAL_TABLET | Freq: Every day | ORAL | 1 refills | Status: DC

## 2023-02-02 MED ORDER — ESOMEPRAZOLE MAGNESIUM 20 MG PO CPDR: 20.0000 mg | DELAYED_RELEASE_CAPSULE | Freq: Every morning | ORAL | 2 refills | Status: DC

## 2023-02-02 MED ORDER — ATORVASTATIN CALCIUM 20 MG PO TABS: 20.0000 mg | ORAL_TABLET | Freq: Every day | ORAL | 1 refills | Status: DC

## 2023-02-02 NOTE — Assessment & Plan Note (Signed)
Chronic.  Controlled.  Continue with current medication regimen of Nexium.  Refills sent today.  Labs ordered today.  Return to clinic in 6 months for reevaluation.  Call sooner if concerns arise.

## 2023-02-02 NOTE — Progress Notes (Signed)
BP 124/80   Pulse (!) 109   Temp 98 F (36.7 C) (Oral)   Wt 271 lb 12.8 oz (123.3 kg)   SpO2 97%   BMI 38.91 kg/m    Subjective:    [name], male    DOB: [birth year], 57 y.o.   MRN: [id number]  HPI: Johnny Rios is a 57 y.o. male presenting on 02/02/2023 for comprehensive medical examination. Current medical complaints include: none  He currently lives with: Interim Problems from his last visit: no  HYPERTENSION / HYPERLIPIDEMIA Satisfied with current treatment? yes Duration of hypertension: years BP monitoring frequency:not checking BP range:  BP medication side effects: no Past BP meds:  Duration of hyperlipidemia: years Cholesterol medication side effects: no Cholesterol supplements: none Past cholesterol medications:atorvastatin and fenofibrate Medication compliance: excellent  Aspirin: no Recent stressors: no Recurrent headaches: no Visual changes: no Palpitations: no Dyspnea: no Chest pain: no Lower extremity edema: no Dizzy/lightheaded: no  DIABETES Hypoglycemic episodes:no Polydipsia/polyuria: no Visual disturbance: no Chest pain: no Paresthesias: no Glucose Monitoring: no  Accucheck frequency: TID  Fasting glucose:  Post prandial:  Evening:  Before meals: Taking Insulin?: no  Long acting insulin:  Short acting insulin: Blood Pressure Monitoring: not checking Retinal Examination: Not up to Date Foot Exam: up to date Diabetic Education: Not Completed Pneumovax: up to date Influenza: up to date Aspirin: no  Depression Screen done today and results listed below:     02/02/2023    3:18 PM 11/02/2022    3:28 PM 08/01/2022    3:10 PM 01/19/2022   10:50 AM 12/22/2021    3:01 PM  Depression screen PHQ 2/9  Decreased Interest 0 0 0 0 0  Down, Depressed, Hopeless 0 0 0 0 1  PHQ - 2 Score 0 0 0 0 1  Altered sleeping 0 0 0 0 0  Tired, decreased energy 1 0 1 0 1  Change in appetite 0 0 0 0 0  Feeling bad or failure about  yourself  0 0 0 0 0  Trouble concentrating 0 0 0 0 0  Moving slowly or fidgety/restless 0 0 0 0 0  Suicidal thoughts 0 0 0    PHQ-9 Score 1 0 1 0 2  Difficult doing work/chores Not difficult at all Not difficult at all Not difficult at all      The patient does not have a history of falls. I did complete a risk assessment for falls. A plan of care for falls was documented.   Past Medical History:  Past Medical History:  Diagnosis Date   Anxiety    Arthritis    GERD (gastroesophageal reflux disease)    Sleep apnea    Uses CPAP   Tachycardia     Surgical History:  Past Surgical History:  Procedure Laterality Date   APPENDECTOMY     CHOLECYSTECTOMY N/A 01/19/2017   Procedure: LAPAROSCOPIC CHOLECYSTECTOMY WITH INTRAOPERATIVE CHOLANGIOGRAM;  Surgeon: Robert Bellow, MD;  Location: [facility];  Service: General;  Laterality: N/A;   COLONOSCOPY WITH PROPOFOL N/A 09/22/2021   Procedure: COLONOSCOPY WITH PROPOFOL;  Surgeon: Virgel Manifold, MD;  Location: [facility];  Service: Endoscopy;  Laterality: N/A;   LACERATION REPAIR Left 04/04/2019   Procedure: Repair Multiple Lacerations;  Surgeon: Leanora Cover, MD;  Location: [facility];  Service: Orthopedics;  Laterality: Left;   MINOR NAILBED REPAIR Left 04/04/2019   Procedure: Minor Nailbed Repair;  Surgeon: Leanora Cover, MD;  Location: [facility];  Service:  Orthopedics;  Laterality: Left;   NERVE AND TENDON REPAIR Left 04/04/2019   Procedure: IRRIGATION AND DEBRIDEMENT OF FIXATION OF OPEN FRACTURES LONG ,RING FINGER;  Surgeon: Leanora Cover, MD;  Location: [facility];  Service: Orthopedics;  Laterality: Left;   TONSILLECTOMY     UMBILICAL HERNIA REPAIR N/A 06/12/2017   Ventral hernia with 6 cm Ventralex ST mesh.   VENTRAL HERNIA REPAIR N/A 07/11/2018   15 x 20 cm intraperitoneal Ventralex mesh  ;  Surgeon: Robert Bellow, MD;  Location: [facility];  Service: General;  Laterality: N/A;    Medications:  No current outpatient medications on file  prior to visit.   No current facility-administered medications on file prior to visit.    Allergies:  No Known Allergies  Social History:  Social History   Socioeconomic History   Marital status: Single    Spouse name: Not on file   Number of children: 2   Years of education: HS   Highest education level: Not on file  Occupational History   Not on file  Tobacco Use   Smoking status: Never   Smokeless tobacco: Former    Types: Nurse, children's Use: Never used  Substance and Sexual Activity   Alcohol use: Yes    Comment: occasional   Drug use: No   Sexual activity: Not Currently  Other Topics Concern   Not on file  Social History Narrative   Drinks 3-4 caffeine drinks a day    Social Determinants of Health   Financial Resource Strain: Not on file  Food Insecurity: Not on file  Transportation Needs: Not on file  Physical Activity: Not on file  Stress: Not on file  Social Connections: Not on file  Intimate Partner Violence: Not on file   Social History   Tobacco Use  Smoking Status Never  Smokeless Tobacco Former   Types: Loss adjuster, chartered   Social History   Substance and Sexual Activity  Alcohol Use Yes   Comment: occasional    Family History:  Family History  Problem Relation Age of Onset   Cancer Father        lung   Alzheimer's disease Mother    Heart attack Brother    Heart attack Brother     Past medical history, surgical history, medications, allergies, family history and social history reviewed with patient today and changes made to appropriate areas of the chart.   Review of Systems  HENT:         Denies vision changes.  Eyes:  Negative for blurred vision and double vision.  Respiratory:  Negative for shortness of breath.   Cardiovascular:  Negative for chest pain, palpitations and leg swelling.  Neurological:  Negative for dizziness, tingling and headaches.  Endo/Heme/Allergies:  Negative for polydipsia.       Denies Polyuria   All  other ROS negative except what is listed above and in the HPI.      Objective:    BP 124/80   Pulse (!) 109   Temp 98 F (36.7 C) (Oral)   Wt 271 lb 12.8 oz (123.3 kg)   SpO2 97%   BMI 38.91 kg/m   Wt Readings from Last 3 Encounters:  02/02/23 271 lb 12.8 oz (123.3 kg)  11/02/22 268 lb 8 oz (121.8 kg)  08/01/22 265 lb 14.4 oz (120.6 kg)    Physical Exam Vitals and nursing note reviewed.  Constitutional:      General: He is not in  acute distress.    Appearance: Normal appearance. He is not ill-appearing, toxic-appearing or diaphoretic.  HENT:     Head: Normocephalic.     Right Ear: Tympanic membrane, ear canal and external ear normal.     Left Ear: Tympanic membrane, ear canal and external ear normal.     Nose: Nose normal. No congestion or rhinorrhea.     Mouth/Throat:     Mouth: Mucous membranes are moist.  Eyes:     General:        Right eye: No discharge.        Left eye: No discharge.     Extraocular Movements: Extraocular movements intact.     Conjunctiva/sclera: Conjunctivae normal.     Pupils: Pupils are equal, round, and reactive to light.  Cardiovascular:     Rate and Rhythm: Normal rate and regular rhythm.     Heart sounds: No murmur heard. Pulmonary:     Effort: Pulmonary effort is normal. No respiratory distress.     Breath sounds: Normal breath sounds. No wheezing, rhonchi or rales.  Abdominal:     General: Abdomen is flat. Bowel sounds are normal. There is no distension.     Palpations: Abdomen is soft.     Tenderness: There is no abdominal tenderness. There is no guarding.  Musculoskeletal:     Cervical back: Normal range of motion and neck supple.  Skin:    General: Skin is warm and dry.     Capillary Refill: Capillary refill takes less than 2 seconds.  Neurological:     General: No focal deficit present.     Mental Status: He is alert and oriented to person, place, and time.     Cranial Nerves: No cranial nerve deficit.     Motor: No weakness.      Deep Tendon Reflexes: Reflexes normal.  Psychiatric:        Mood and Affect: Mood normal.        Behavior: Behavior normal.        Thought Content: Thought content normal.        Judgment: Judgment normal.     Results for orders placed or performed in visit on 11/02/22  Microalbumin, Urine Waived  Result Value Ref Range   Microalb, Ur Waived 10 0 - 19 mg/L   Creatinine, Urine Waived 300 10 - 300 mg/dL   Microalb/Creat Ratio <30 <30 mg/g      Assessment & Plan:   Problem List Items Addressed This Visit       Digestive   PUD (peptic ulcer disease)    Chronic.  Controlled.  Continue with current medication regimen of Nexium.  Refills sent today.  Labs ordered today.  Return to clinic in 6 months for reevaluation.  Call sooner if concerns arise.        Relevant Medications   esomeprazole (NEXIUM) 20 MG capsule     Endocrine   Diabetes mellitus without complication (HCC)    Chronic.  Last A1c was 6.5%.  Not up to date on eye exam.  Controlled without medication.  Labs ordered today.  Return to clinic in 6 months for reevaluation.  Call sooner if concerns arise.        Relevant Medications   atorvastatin (LIPITOR) 20 MG tablet   Other Relevant Orders   Comprehensive metabolic panel   HgB X1G     Other   Anxiety    Chronic.  Controlled.  Continue with current medication regimen of Clonazepam daily.  Controlled substance agreement and UDS up to date.  Understands risks of taking Benzodiazepine and okay with continuing. Labs ordered today.  Return to clinic in 3 months for reevaluation.  Call sooner if concerns arise.        Relevant Medications   clonazePAM (KLONOPIN) 1 MG tablet   Long-term current use of benzodiazepine    Refer to anxiety plan of care -- UDS today and obtain controlled subs contract up to date.      Hyperlipidemia    Chronic.  Controlled.  Continue with current medication regimen of Atorvastatin and Zetia daily.  Refills sent today.  Labs  ordered today.  Return to clinic in 6 months for reevaluation.  Call sooner if concerns arise.        Relevant Medications   atorvastatin (LIPITOR) 20 MG tablet   fenofibrate 160 MG tablet   Other Relevant Orders   Lipid panel   Other Visit Diagnoses     Annual physical exam    -  Primary   Health maintenance reviewed during visit today. Declined vaccines.  Colonoscopy ordered today.   Relevant Orders   CBC with Differential/Platelet   Comprehensive metabolic panel   Lipid panel   TSH   Urinalysis, Routine w reflex microscopic   HgB A1c   PSA   Screening for colon cancer       Relevant Orders   Ambulatory referral to Gastroenterology        Discussed aspirin prophylaxis for myocardial infarction prevention and decision was that we recommended ASA, and patient refused  LABORATORY TESTING:  Health maintenance labs ordered today as discussed above.   The natural history of prostate cancer and ongoing controversy regarding screening and potential treatment outcomes of prostate cancer has been discussed with the patient. The meaning of a false positive PSA and a false negative PSA has been discussed. He indicates understanding of the limitations of this screening test and wishes to proceed with screening PSA testing.   IMMUNIZATIONS:   - Tdap: Tetanus vaccination status reviewed: last tetanus booster within 10 years. - Influenza: refused - Pneumovax: NA - Prevnar: NA - COVID: NA - HPV: NA - Shingrix vaccine: Discussed at visit today  SCREENING: - Colonoscopy: Ordered today Discussed with patient purpose of the colonoscopy is to detect colon cancer at curable precancerous or early stages   - AAA Screening: NA -Hearing Test: NA -Spirometry: NA  PATIENT COUNSELING:    Sexuality: Discussed sexually transmitted diseases, partner selection, use of condoms, avoidance of unintended pregnancy  and contraceptive alternatives.   Advised to avoid cigarette smoking.  I  discussed with the patient that most people either abstain from alcohol or drink within safe limits (<=14/week and <=4 drinks/occasion for males, <=7/weeks and <= 3 drinks/occasion for females) and that the risk for alcohol disorders and other health effects rises proportionally with the number of drinks per week and how often a drinker exceeds daily limits.  Discussed cessation/primary prevention of drug use and availability of treatment for abuse.   Diet: Encouraged to adjust caloric intake to maintain  or achieve ideal body weight, to reduce intake of dietary saturated fat and total fat, to limit sodium intake by avoiding high sodium foods and not adding table salt, and to maintain adequate dietary potassium and calcium preferably from fresh fruits, vegetables, and low-fat dairy products.    stressed the importance of regular exercise  Injury prevention: Discussed safety belts, safety helmets, smoke detector, smoking near bedding or upholstery.  Dental health: Discussed importance of regular tooth brushing, flossing, and dental visits.   Follow up plan: NEXT PREVENTATIVE PHYSICAL DUE IN 1 YEAR. Return in about 3 months (around 05/03/2023) for Medication Management.

## 2023-02-02 NOTE — Assessment & Plan Note (Signed)
Chronic.  Controlled.  Continue with current medication regimen of Atorvastatin and Zetia daily.  Refills sent today.  Labs ordered today.  Return to clinic in 6 months for reevaluation.  Call sooner if concerns arise.

## 2023-02-02 NOTE — Assessment & Plan Note (Signed)
Refer to anxiety plan of care -- UDS today and obtain controlled subs contract up to date.

## 2023-02-02 NOTE — Assessment & Plan Note (Signed)
Chronic.  Controlled.  Continue with current medication regimen of Clonazepam daily.  Controlled substance agreement and UDS up to date.  Understands risks of taking Benzodiazepine and okay with continuing. Labs ordered today.  Return to clinic in 3 months for reevaluation.  Call sooner if concerns arise.

## 2023-02-02 NOTE — Assessment & Plan Note (Signed)
Chronic.  Last A1c was 6.5%.  Not up to date on eye exam.  Controlled without medication.  Labs ordered today.  Return to clinic in 6 months for reevaluation.  Call sooner if concerns arise.

## 2023-02-03 LAB — CBC WITH DIFFERENTIAL/PLATELET
Basophils Absolute: 0.1 10*3/uL (ref 0.0–0.2)
Basos: 1 %
EOS (ABSOLUTE): 0.2 10*3/uL (ref 0.0–0.4)
Eos: 2 %
Hematocrit: 42.1 % (ref 37.5–51.0)
Hemoglobin: 13.7 g/dL (ref 13.0–17.7)
Immature Grans (Abs): 0 10*3/uL (ref 0.0–0.1)
Immature Granulocytes: 0 %
Lymphocytes Absolute: 2.9 10*3/uL (ref 0.7–3.1)
Lymphs: 34 %
MCH: 26.1 pg — ABNORMAL LOW (ref 26.6–33.0)
MCHC: 32.5 g/dL (ref 31.5–35.7)
MCV: 80 fL (ref 79–97)
Monocytes Absolute: 0.6 10*3/uL (ref 0.1–0.9)
Monocytes: 7 %
Neutrophils Absolute: 4.7 10*3/uL (ref 1.4–7.0)
Neutrophils: 56 %
Platelets: 326 10*3/uL (ref 150–450)
RBC: 5.24 x10E6/uL (ref 4.14–5.80)
RDW: 12.6 % (ref 11.6–15.4)
WBC: 8.4 10*3/uL (ref 3.4–10.8)

## 2023-02-03 LAB — LIPID PANEL
Chol/HDL Ratio: 5.3 ratio — ABNORMAL HIGH (ref 0.0–5.0)
Cholesterol, Total: 149 mg/dL (ref 100–199)
HDL: 28 mg/dL — ABNORMAL LOW (ref >39)
LDL Chol Calc (NIH): 84 mg/dL (ref 0–99)
Triglycerides: 215 mg/dL — ABNORMAL HIGH (ref 0–149)
VLDL Cholesterol Cal: 37 mg/dL (ref 5–40)

## 2023-02-03 LAB — COMPREHENSIVE METABOLIC PANEL
ALT: 27 IU/L (ref 0–44)
AST: 20 IU/L (ref 0–40)
Albumin/Globulin Ratio: 2 (ref 1.2–2.2)
Albumin: 4.6 g/dL (ref 3.8–4.9)
Alkaline Phosphatase: 124 IU/L — ABNORMAL HIGH (ref 44–121)
BUN/Creatinine Ratio: 12 (ref 9–20)
BUN: 12 mg/dL (ref 6–24)
Bilirubin Total: 0.4 mg/dL (ref 0.0–1.2)
CO2: 21 mmol/L (ref 20–29)
Calcium: 9.6 mg/dL (ref 8.7–10.2)
Chloride: 102 mmol/L (ref 96–106)
Creatinine, Ser: 0.97 mg/dL (ref 0.76–1.27)
Globulin, Total: 2.3 g/dL (ref 1.5–4.5)
Glucose: 142 mg/dL — ABNORMAL HIGH (ref 70–99)
Potassium: 4.3 mmol/L (ref 3.5–5.2)
Sodium: 139 mmol/L (ref 134–144)
Total Protein: 6.9 g/dL (ref 6.0–8.5)
eGFR: 93 mL/min/{1.73_m2} (ref >59)

## 2023-02-03 LAB — HEMOGLOBIN A1C
Est. average glucose Bld gHb Est-mCnc: 157 mg/dL
Hgb A1c MFr Bld: 7.1 % — ABNORMAL HIGH (ref 4.8–5.6)

## 2023-02-03 LAB — URINALYSIS, ROUTINE W REFLEX MICROSCOPIC
Bilirubin, UA: NEGATIVE
Glucose, UA: NEGATIVE
Ketones, UA: NEGATIVE
Leukocytes,UA: NEGATIVE
Nitrite, UA: NEGATIVE
Protein,UA: NEGATIVE
RBC, UA: NEGATIVE
Specific Gravity, UA: 1.022 (ref 1.005–1.030)
Urobilinogen, Ur: 0.2 mg/dL (ref 0.2–1.0)
pH, UA: 5.5 (ref 5.0–7.5)

## 2023-02-03 LAB — TSH: TSH: 0.956 u[IU]/mL (ref 0.450–4.500)

## 2023-02-03 LAB — PSA: Prostate Specific Ag, Serum: 4.3 ng/mL — ABNORMAL HIGH (ref 0.0–4.0)

## 2023-02-03 NOTE — Addendum Note (Signed)
Addended by: Jon Billings on: 02/03/2023 12:22 PM   Modules accepted: Orders

## 2023-02-03 NOTE — Progress Notes (Signed)
Please let patient know that his A1c increased to 7.1 from 6.5.  I recommend he follow a low carb diet.  We will have to add medication if it increases again.  His PSA (prostate cancer screening) was elevated.  I would like him to come back in 2 weeks and repeat the PSA.  Avoiding have sexual intercourse 24 hours prior.  This will just be a lab visit.  Otherwise, lab work looks good.  No other concerns at this time.

## 2023-02-06 ENCOUNTER — Telehealth: Payer: Self-pay

## 2023-02-06 ENCOUNTER — Other Ambulatory Visit: Payer: Self-pay

## 2023-02-06 DIAGNOSIS — Z86010 Personal history of colonic polyps: Secondary | ICD-10-CM

## 2023-02-06 DIAGNOSIS — D175 Benign lipomatous neoplasm of intra-abdominal organs: Secondary | ICD-10-CM

## 2023-02-06 MED ORDER — NA SULFATE-K SULFATE-MG SULF 17.5-3.13-1.6 GM/177ML PO SOLN: 1.0000 | Freq: Once | ORAL | 0 refills | Status: AC

## 2023-02-06 NOTE — Telephone Encounter (Signed)
Gastroenterology Pre-Procedure Review  Request Date: 02/15/23 Requesting Physician: Dr. Vicente Males  PATIENT REVIEW QUESTIONS: The patient responded to the following health history questions as indicated:    1. Are you having any GI issues? no 2. Do you have a personal history of Polyps? yes (last colonoscopy performed by Dr. Bonna Gains 09/22/21 small lipoma was present) 3. Do you have a family history of Colon Cancer or Polyps? no 4. Diabetes Mellitus? no 5. Joint replacements in the past 12 months?no 6. Major health problems in the past 3 months?no 7. Any artificial heart valves, MVP, or defibrillator?no    MEDICATIONS & ALLERGIES:    Patient reports the following regarding taking any anticoagulation/antiplatelet therapy:   Plavix, Coumadin, Eliquis, Xarelto, Lovenox, Pradaxa, Brilinta, or Effient? no Aspirin? no  Patient confirms/reports the following medications:  Current Outpatient Medications  Medication Sig Dispense Refill   atorvastatin (LIPITOR) 20 MG tablet Take 1 tablet (20 mg total) by mouth daily. 90 tablet 1   clonazePAM (KLONOPIN) 1 MG tablet TAKE ONE TABLET DAILY AS NEEDED. 30 tablet 2   esomeprazole (NEXIUM) 20 MG capsule Take 1 capsule (20 mg total) by mouth every morning. 90 capsule 2   fenofibrate 160 MG tablet Take 1 tablet (160 mg total) by mouth daily. 90 tablet 1   No current facility-administered medications for this visit.    Patient confirms/reports the following allergies:  No Known Allergies  No orders of the defined types were placed in this encounter.   AUTHORIZATION INFORMATION Primary Insurance: 1D#: Group #:  Secondary Insurance: 1D#: Group #:  SCHEDULE INFORMATION: Date: 02/15/23 Time: Location: [facility]

## 2023-02-15 ENCOUNTER — Encounter: Payer: Self-pay | Admitting: Gastroenterology

## 2023-02-15 ENCOUNTER — Ambulatory Visit
Admission: RE | Admit: 2023-02-15 | Discharge: 2023-02-15 | Disposition: A | Discharge status: Home / Self Care | Payer: Commercial Managed Care - HMO | Source: Ambulatory Visit | Attending: Gastroenterology | Admitting: Gastroenterology

## 2023-02-15 ENCOUNTER — Ambulatory Visit: Payer: Commercial Managed Care - HMO | Admitting: Certified Registered"

## 2023-02-15 ENCOUNTER — Encounter
Admission: RE | Disposition: A | Discharge status: Home / Self Care | Payer: Self-pay | Source: Ambulatory Visit | Attending: Gastroenterology

## 2023-02-15 DIAGNOSIS — Z8711 Personal history of peptic ulcer disease: Secondary | ICD-10-CM | POA: Diagnosis not present

## 2023-02-15 DIAGNOSIS — K219 Gastro-esophageal reflux disease without esophagitis: Secondary | ICD-10-CM | POA: Diagnosis not present

## 2023-02-15 DIAGNOSIS — Z860100 Personal history of colon polyps, unspecified: Secondary | ICD-10-CM

## 2023-02-15 DIAGNOSIS — G4730 Sleep apnea, unspecified: Secondary | ICD-10-CM | POA: Diagnosis not present

## 2023-02-15 DIAGNOSIS — D123 Benign neoplasm of transverse colon: Secondary | ICD-10-CM | POA: Insufficient documentation

## 2023-02-15 DIAGNOSIS — Z1211 Encounter for screening for malignant neoplasm of colon: Secondary | ICD-10-CM | POA: Diagnosis not present

## 2023-02-15 DIAGNOSIS — F419 Anxiety disorder, unspecified: Secondary | ICD-10-CM | POA: Insufficient documentation

## 2023-02-15 DIAGNOSIS — D126 Benign neoplasm of colon, unspecified: Secondary | ICD-10-CM | POA: Diagnosis not present

## 2023-02-15 HISTORY — PX: COLONOSCOPY WITH PROPOFOL: SHX5780

## 2023-02-15 SURGERY — COLONOSCOPY WITH PROPOFOL
Anesthesia: General

## 2023-02-15 MED ORDER — GLYCOPYRROLATE 0.2 MG/ML IJ SOLN
INTRAMUSCULAR | Status: AC
  Filled 2023-02-15: qty 1

## 2023-02-15 MED ORDER — MIDAZOLAM HCL 5 MG/5ML IJ SOLN
INTRAMUSCULAR | Status: DC | PRN
  Administered 2023-02-15: 2 mg via INTRAVENOUS

## 2023-02-15 MED ORDER — LIDOCAINE 2% (20 MG/ML) 5 ML SYRINGE
INTRAMUSCULAR | Status: DC | PRN
  Administered 2023-02-15: 20 mg via INTRAVENOUS

## 2023-02-15 MED ORDER — PROPOFOL 10 MG/ML IV BOLUS
INTRAVENOUS | Status: DC | PRN
  Administered 2023-02-15: 100 mg via INTRAVENOUS

## 2023-02-15 MED ORDER — PROPOFOL 500 MG/50ML IV EMUL
INTRAVENOUS | Status: DC | PRN
  Administered 2023-02-15: 120 ug/kg/min via INTRAVENOUS

## 2023-02-15 MED ORDER — MIDAZOLAM HCL 2 MG/2ML IJ SOLN
INTRAMUSCULAR | Status: AC
  Filled 2023-02-15: qty 2

## 2023-02-15 MED ORDER — SODIUM CHLORIDE 0.9 % IV SOLN: INTRAVENOUS | Status: DC

## 2023-02-15 NOTE — Anesthesia Preprocedure Evaluation (Signed)
Anesthesia Evaluation  Patient identified by MRN, date of birth, ID band Patient awake    Reviewed: Allergy & Precautions, NPO status , Patient's Chart, lab work & pertinent test results  History of Anesthesia Complications Negative for: history of anesthetic complications  Airway Mallampati: II  TM Distance: >3 FB Neck ROM: Full    Dental  (+) Dental Advidsory Given, Poor Dentition   Pulmonary neg shortness of breath, sleep apnea and Continuous Positive Airway Pressure Ventilation , neg COPD, neg recent URI   breath sounds clear to auscultation- rhonchi (-) wheezing      Cardiovascular Exercise Tolerance: Good (-) hypertension(-) angina (-) CAD, (-) Past MI, (-) Cardiac Stents and (-) CABG  Rhythm:Regular Rate:Normal - Systolic murmurs and - Diastolic murmurs    Neuro/Psych neg Seizures  Anxiety     negative neurological ROS     GI/Hepatic Neg liver ROS, PUD,GERD  ,,  Endo/Other  negative endocrine ROSneg diabetes    Renal/GU negative Renal ROS     Musculoskeletal  (+) Arthritis ,    Abdominal  (+) + obese  Peds  Hematology negative hematology ROS (+)   Anesthesia Other Findings Past Medical History: No date: Anxiety No date: Arthritis No date: GERD (gastroesophageal reflux disease) No date: Sleep apnea     Comment:  Uses CPAP No date: Tachycardia   Reproductive/Obstetrics                             Anesthesia Physical Anesthesia Plan  ASA: 2  Anesthesia Plan: General   Post-op Pain Management:    Induction: Intravenous  PONV Risk Score and Plan: 2 and Propofol infusion and TIVA  Airway Management Planned: Natural Airway and Nasal Cannula  Additional Equipment:   Intra-op Plan:   Post-operative Plan:   Informed Consent: I have reviewed the patients History and Physical, chart, labs and discussed the procedure including the risks, benefits and alternatives for the  proposed anesthesia with the patient or authorized representative who has indicated his/her understanding and acceptance.     Dental advisory given  Plan Discussed with: CRNA and Anesthesiologist  Anesthesia Plan Comments:         Anesthesia Quick Evaluation

## 2023-02-15 NOTE — H&P (Signed)
Jonathon Bellows, MD [address], [place], [place], [place], [address] [address], [place], [place], [place], [address] Phone: [phone]  Fax: [phone]  Primary Care Physician:  Jon Billings, NP   Pre-Procedure History & Physical: HPI:  Johnny Rios is a 57 y.o. male is here for an colonoscopy.   Past Medical History:  Diagnosis Date   Anxiety    Arthritis    GERD (gastroesophageal reflux disease)    Sleep apnea    Uses CPAP   Tachycardia     Past Surgical History:  Procedure Laterality Date   APPENDECTOMY     CHOLECYSTECTOMY N/A 01/19/2017   Procedure: LAPAROSCOPIC CHOLECYSTECTOMY WITH INTRAOPERATIVE CHOLANGIOGRAM;  Surgeon: Robert Bellow, MD;  Location: [facility];  Service: General;  Laterality: N/A;   COLONOSCOPY WITH PROPOFOL N/A 09/22/2021   Procedure: COLONOSCOPY WITH PROPOFOL;  Surgeon: Virgel Manifold, MD;  Location: [facility];  Service: Endoscopy;  Laterality: N/A;   LACERATION REPAIR Left 04/04/2019   Procedure: Repair Multiple Lacerations;  Surgeon: Leanora Cover, MD;  Location: [place];  Service: Orthopedics;  Laterality: Left;   MINOR NAILBED REPAIR Left 04/04/2019   Procedure: Minor Nailbed Repair;  Surgeon: Leanora Cover, MD;  Location: [place];  Service: Orthopedics;  Laterality: Left;   NERVE AND TENDON REPAIR Left 04/04/2019   Procedure: IRRIGATION AND DEBRIDEMENT OF FIXATION OF OPEN FRACTURES LONG ,RING FINGER;  Surgeon: Leanora Cover, MD;  Location: [place];  Service: Orthopedics;  Laterality: Left;   TONSILLECTOMY     UMBILICAL HERNIA REPAIR N/A 06/12/2017   Ventral hernia with 6 cm Ventralex ST mesh.   VENTRAL HERNIA REPAIR N/A 07/11/2018   15 x 20 cm intraperitoneal Ventralex mesh  ;  Surgeon: Robert Bellow, MD;  Location: [facility];  Service: General;  Laterality: N/A;    Prior to Admission medications   Medication Sig Start Date End Date Taking? Authorizing Provider  atorvastatin (LIPITOR) 20 MG tablet Take 1 tablet (20 mg total) by  mouth daily. 02/02/23   Jon Billings, NP  clonazePAM (KLONOPIN) 1 MG tablet TAKE ONE TABLET DAILY AS NEEDED. 02/02/23   Jon Billings, NP  esomeprazole (NEXIUM) 20 MG capsule Take 1 capsule (20 mg total) by mouth every morning. 02/02/23   Jon Billings, NP  fenofibrate 160 MG tablet Take 1 tablet (160 mg total) by mouth daily. 02/02/23   Jon Billings, NP  meloxicam (MOBIC) 7.5 MG tablet Take 1 tablet every day by oral route for 30 days.    [provider]  tiZANidine (ZANAFLEX) 4 MG tablet Take 1 tablet every 6 hours by oral route as needed for 30 days.    [provider]  traMADol (ULTRAM) 50 MG tablet Take 1 tablet every 6 hours by oral route. 04/23/18   [provider]    Allergies as of 02/06/2023   (No Known Allergies)    Family History  Problem Relation Age of Onset   Cancer Father        lung   Alzheimer's disease Mother    Heart attack Brother    Heart attack Brother     Social History   Socioeconomic History   Marital status: Single    Spouse name: Not on file   Number of children: 2   Years of education: HS   Highest education level: Not on file  Occupational History   Not on file  Tobacco Use   Smoking status: Never   Smokeless tobacco: Former  Types: Chew  Vaping Use   Vaping Use: Never used  Substance and Sexual Activity   Alcohol use: Yes    Comment: occasional   Drug use: No   Sexual activity: Not Currently  Other Topics Concern   Not on file  Social History Narrative   Drinks 3-4 caffeine drinks a day    Social Determinants of Health   Financial Resource Strain: Not on file  Food Insecurity: Not on file  Transportation Needs: Not on file  Physical Activity: Not on file  Stress: Not on file  Social Connections: Not on file  Intimate Partner Violence: Not on file    Review of Systems: See HPI, otherwise negative ROS  Physical Exam: BP (!) 141/96   Pulse 98   Temp (!) 97.5 F (36.4 C) (Temporal)    Resp 18   Ht 5' 9"$  (1.753 m)   Wt 120.5 kg   SpO2 99%   BMI 39.22 kg/m  General:   Alert,  pleasant and cooperative in NAD Head:  Normocephalic and atraumatic. Neck:  Supple; no masses or thyromegaly. Lungs:  Clear throughout to auscultation, normal respiratory effort.    Heart:  +S1, +S2, Regular rate and rhythm, No edema. Abdomen:  Soft, nontender and nondistended. Normal bowel sounds, without guarding, and without rebound.   Neurologic:  Alert and  oriented x4;  grossly normal neurologically.  Impression/Plan: Johnny Rios is here for an colonoscopy to be performed for surveillance due to prior history of colon polyps   Risks, benefits, limitations, and alternatives regarding  colonoscopy have been reviewed with the patient.  Questions have been answered.  All parties agreeable.   Jonathon Bellows, MD  02/15/2023, 10:10 AM

## 2023-02-15 NOTE — Transfer of Care (Signed)
Immediate Anesthesia Transfer of Care Note  Patient: Johnny Rios  Procedure(s) Performed: COLONOSCOPY WITH PROPOFOL  Patient Location: Endoscopy Unit  Anesthesia Type:General  Level of Consciousness: drowsy  Airway & Oxygen Therapy: Patient Spontanous Breathing  Post-op Assessment: Report given to RN and Post -op Vital signs reviewed and stable  Post vital signs: Reviewed  Last Vitals:  Vitals Value Taken Time  BP 107/73   Temp    Pulse 83 02/15/23 1103  Resp 13 02/15/23 1103  SpO2 94 % 02/15/23 1103  Vitals shown include unvalidated device data.  Last Pain:  Vitals:   02/15/23 1007  TempSrc: Temporal  PainSc: 0-No pain         Complications: No notable events documented.

## 2023-02-15 NOTE — Op Note (Signed)
[facility] Gastroenterology Patient Name: Johnny Rios Procedure Date: 02/15/2023 10:45 AM MRN: [id number] Account #: [id number] Date of Birth: [birth year] Admit Type: Outpatient Age: 57 Room: [facility] ENDO ROOM 3 Gender: Male Note Status: Finalized Instrument Name: Jasper Riling [id number] Procedure:             Colonoscopy Indications:           Surveillance: History of adenomatous polyps,                         inadequate prep on last exam (<48yr, Last colonoscopy:                         September 2022 Providers:             KJonathon BellowsMD, MD Medicines:             Monitored Anesthesia Care Complications:         No immediate complications. Procedure:             Pre-Anesthesia Assessment:                        - Prior to the procedure, a History and Physical was                         performed, and patient medications, allergies and                         sensitivities were reviewed. The patient's tolerance                         of previous anesthesia was reviewed.                        - The risks and benefits of the procedure and the                         sedation options and risks were discussed with the                         patient. All questions were answered and informed                         consent was obtained.                        - ASA Grade Assessment: II - A patient with mild                         systemic disease.                        After obtaining informed consent, the colonoscope was                         passed under direct vision. Throughout the procedure,                         the patient's blood pressure, pulse, and oxygen  saturations were monitored continuously. The                         Colonoscope was introduced through the anus and                         advanced to the the cecum, identified by the                         appendiceal orifice. The colonoscopy was performed                          with ease. The patient tolerated the procedure well.                         The quality of the bowel preparation was poor. The                         ileocecal valve, appendiceal orifice, and rectum were                         photographed. Findings:      The perianal and digital rectal examinations were normal.      A 5 mm polyp was found in the transverse colon. The polyp was sessile.       The polyp was removed with a cold snare. Resection and retrieval were       complete.      A large amount of stool was found in the entire colon, making       visualization difficult. Impression:            - Preparation of the colon was poor.                        - One 5 mm polyp in the transverse colon, removed with                         a cold snare. Resected and retrieved.                        - Stool in the entire examined colon. Recommendation:        - Discharge patient to home (with escort).                        - Resume previous diet.                        - Continue present medications.                        - Await pathology results.                        - Repeat colonoscopy in 6 months because the bowel                         preparation was suboptimal.                        - 2 day prep Procedure Code(s):     ---  Professional ---                        [phone], Colonoscopy, flexible; with removal of                         tumor(s), polyp(s), or other lesion(s) by snare                         technique Diagnosis Code(s):     --- Professional ---                        Z86.010, Personal history of colonic polyps                        D12.3, Benign neoplasm of transverse colon (hepatic                         flexure or splenic flexure) CPT copyright 2022 American Medical Association. All rights reserved. The codes documented in this report are preliminary and upon coder review may  be revised to meet current compliance requirements. Jonathon Bellows, MD Jonathon Bellows  MD, MD 02/15/2023 11:01:05 AM This report has been signed electronically. Number of Addenda: 0 Note Initiated On: 02/15/2023 10:45 AM Scope Withdrawal Time: 0 hours 7 minutes 6 seconds  Total Procedure Duration: 0 hours 8 minutes 59 seconds  Estimated Blood Loss:  Estimated blood loss: none.      [facility]

## 2023-02-16 ENCOUNTER — Telehealth: Payer: Self-pay

## 2023-02-16 ENCOUNTER — Other Ambulatory Visit: Payer: Commercial Managed Care - HMO

## 2023-02-16 ENCOUNTER — Encounter: Payer: Self-pay | Admitting: Gastroenterology

## 2023-02-16 DIAGNOSIS — R972 Elevated prostate specific antigen [PSA]: Secondary | ICD-10-CM

## 2023-02-16 DIAGNOSIS — E119 Type 2 diabetes mellitus without complications: Secondary | ICD-10-CM

## 2023-02-16 LAB — MICROALBUMIN, URINE WAIVED
Creatinine, Urine Waived: 10 mg/dL (ref 10–300)
Microalb, Ur Waived: 10 mg/L (ref 0–19)
Microalb/Creat Ratio: <30 mg/g (ref <30)

## 2023-02-16 LAB — SURGICAL PATHOLOGY

## 2023-02-16 NOTE — Anesthesia Postprocedure Evaluation (Signed)
Anesthesia Post Note  Patient: KEITH STARN  Procedure(s) Performed: COLONOSCOPY WITH PROPOFOL  Patient location during evaluation: Endoscopy Anesthesia Type: General Level of consciousness: awake and alert Pain management: pain level controlled Vital Signs Assessment: post-procedure vital signs reviewed and stable Respiratory status: spontaneous breathing, nonlabored ventilation, respiratory function stable and patient connected to nasal cannula oxygen Cardiovascular status: blood pressure returned to baseline and stable Postop Assessment: no apparent nausea or vomiting Anesthetic complications: no   No notable events documented.   Last Vitals:  Vitals:   02/15/23 1007 02/15/23 1102  BP: (!) 141/96 107/72  Pulse: 98 83  Resp: 18 12  Temp: (!) 36.4 C (!) 36.4 C  SpO2: 99% 94%    Last Pain:  Vitals:   02/15/23 1124  TempSrc:   PainSc: 0-No pain                 Martha Clan

## 2023-02-16 NOTE — Progress Notes (Signed)
Likely polyp lost with all the fecal matter.  Repeat colonoscopy in 6 months due to suboptimal preparation with a 2-day prep

## 2023-02-16 NOTE — Telephone Encounter (Signed)
Called and left a message for call back. Sent mychart message  °

## 2023-02-16 NOTE — Telephone Encounter (Signed)
-----   Message from Jonathon Bellows, MD sent at 02/16/2023  3:23 PM EST ----- Likely polyp lost with all the fecal matter.  Repeat colonoscopy in 6 months due to suboptimal preparation with a 2-day prep

## 2023-02-17 NOTE — Progress Notes (Signed)
Hi Pericles.  Your microalbumin looks good . No concerns at this time.

## 2023-02-18 LAB — PSA: Prostate Specific Ag, Serum: 2.7 ng/mL (ref 0.0–4.0)

## 2023-02-20 NOTE — Progress Notes (Signed)
Hi Johnny Rios.  Your PSA went back to your normal range.  We will continue checking at future visits.

## 2023-04-05 ENCOUNTER — Encounter: Payer: Self-pay | Admitting: Nurse Practitioner

## 2023-04-26 LAB — HM DIABETES EYE EXAM

## 2023-04-27 IMAGING — DX DG CHEST 1V PORT
1 series · 1 of 1 positions shown · non-contrast
Comparison: 07/16/2018

CLINICAL DATA: Chest pain

EXAM:
PORTABLE CHEST 1 VIEW

[chest ap]
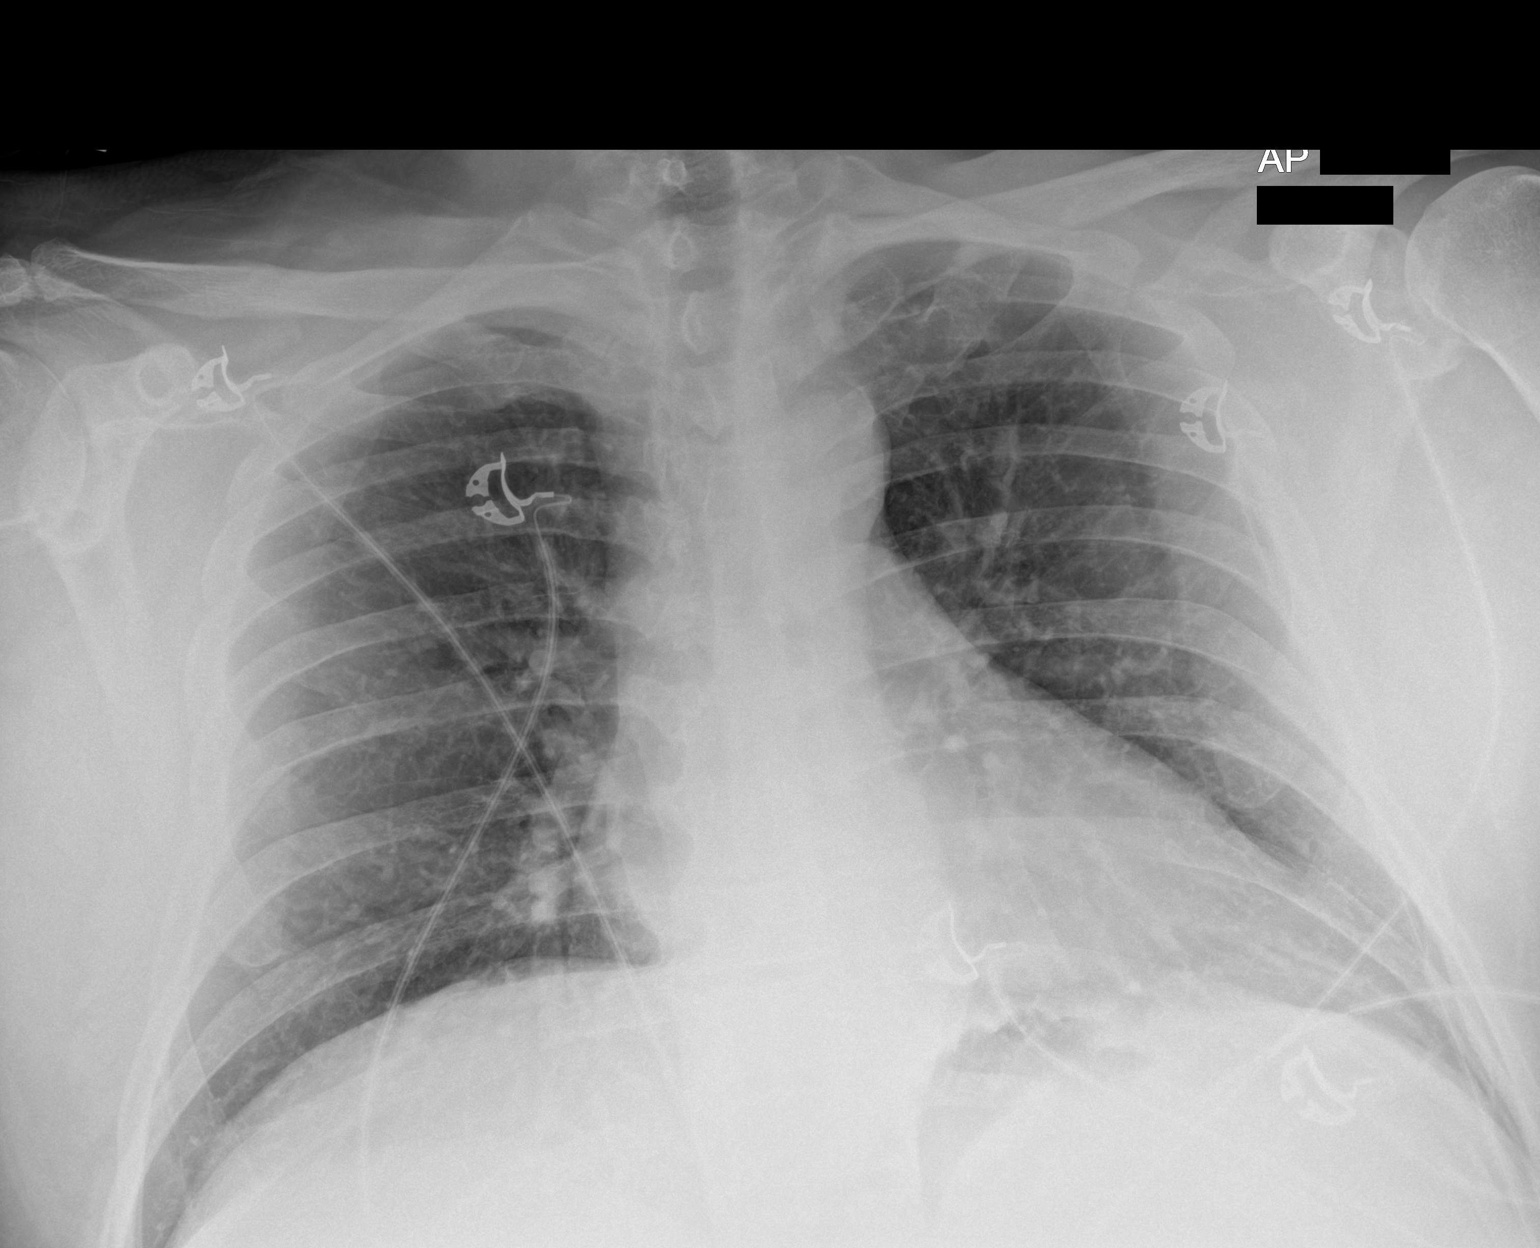

[1 of 1 positions shown; findings below may reference images not displayed]

FINDINGS: Cardiac shadow is stable. The lungs are clear bilaterally. No acute
bony abnormality is seen.
IMPRESSION: No active disease.

## 2023-05-02 ENCOUNTER — Encounter: Payer: Self-pay | Admitting: Nurse Practitioner

## 2023-05-02 ENCOUNTER — Ambulatory Visit (INDEPENDENT_AMBULATORY_CARE_PROVIDER_SITE_OTHER): Payer: Commercial Managed Care - HMO | Admitting: Nurse Practitioner

## 2023-05-02 VITALS — BP 126/88 | HR 101 | Temp 97.9°F | Wt 272.7 lb

## 2023-05-02 DIAGNOSIS — E785 Hyperlipidemia, unspecified: Secondary | ICD-10-CM

## 2023-05-02 DIAGNOSIS — Z79899 Other long term (current) drug therapy: Secondary | ICD-10-CM | POA: Diagnosis not present

## 2023-05-02 DIAGNOSIS — E118 Type 2 diabetes mellitus with unspecified complications: Secondary | ICD-10-CM | POA: Diagnosis not present

## 2023-05-02 DIAGNOSIS — F419 Anxiety disorder, unspecified: Secondary | ICD-10-CM

## 2023-05-02 MED ORDER — ONDANSETRON HCL 4 MG PO TABS: 4.0000 mg | ORAL_TABLET | Freq: Three times a day (TID) | ORAL | 0 refills | Status: DC | PRN

## 2023-05-02 MED ORDER — CLONAZEPAM 1 MG PO TABS: ORAL_TABLET | ORAL | 2 refills | Status: DC

## 2023-05-02 NOTE — Assessment & Plan Note (Signed)
Chronic.  Controlled.  Continue with current medication regimen of Atorvastatin.  Labs ordered today.  Return to clinic in 6 months for reevaluation.  Call sooner if concerns arise.   

## 2023-05-02 NOTE — Progress Notes (Signed)
BP 126/88   Pulse (!) 101   Temp 97.9 F (36.6 C) (Oral)   Wt 272 lb 11.2 oz (123.7 kg)   SpO2 97%   BMI 40.27 kg/m    Subjective:    [name], male    DOB: [birth year], 57 y.o.   MRN: [id number]  HPI: Johnny Rios is a 57 y.o. male  Chief Complaint  Patient presents with   Medication Management   HYPERTENSION / HYPERLIPIDEMIA Satisfied with current treatment? yes Duration of hypertension: years BP monitoring frequency:not checking BP range:  BP medication side effects: no Past BP meds:  Duration of hyperlipidemia: years Cholesterol medication side effects: no Cholesterol supplements: none Past cholesterol medications:atorvastatin and fenofibrate Medication compliance: excellent  Aspirin: no Recent stressors: no Recurrent headaches: no Visual changes: no Palpitations: no Dyspnea: no Chest pain: no Lower extremity edema: no Dizzy/lightheaded: no  DIABETES Eye exam and foot exam are up to date.  Well controlled without medication.  Patient states he gets nausea once in awhile. Used to take Zofran and would like an updated prescription.  Hypoglycemic episodes:no Polydipsia/polyuria: no Visual disturbance: no Chest pain: no Paresthesias: no Glucose Monitoring: no  Accucheck frequency: not checking.   Fasting glucose:  Post prandial:  Evening:  Before meals: Taking Insulin?: no  Long acting insulin:  Short acting insulin: Blood Pressure Monitoring: not checking Retinal Examination: Not up to Date Foot Exam: up to date Diabetic Education: Not Completed Pneumovax: up to date Influenza: up to date Aspirin: no  ANXIETY Doing well with Klonopin.  Takes it daily.  Denies concerns at visit today.    Relevant past medical, surgical, family and social history reviewed and updated as indicated. Interim medical history since our last visit reviewed. Allergies and medications reviewed and updated.  Review of Systems  Eyes:  Negative for  visual disturbance.  Respiratory:  Negative for chest tightness and shortness of breath.   Cardiovascular:  Negative for chest pain, palpitations and leg swelling.  Gastrointestinal:  Positive for nausea.  Endocrine: Negative for polydipsia and polyuria.  Neurological:  Negative for dizziness, light-headedness, numbness and headaches.  Psychiatric/Behavioral:  The patient is nervous/anxious.     Per HPI unless specifically indicated above     Objective:    BP 126/88   Pulse (!) 101   Temp 97.9 F (36.6 C) (Oral)   Wt 272 lb 11.2 oz (123.7 kg)   SpO2 97%   BMI 40.27 kg/m   Wt Readings from Last 3 Encounters:  05/02/23 272 lb 11.2 oz (123.7 kg)  02/15/23 265 lb 9.6 oz (120.5 kg)  02/02/23 271 lb 12.8 oz (123.3 kg)    Physical Exam Vitals and nursing note reviewed.  Constitutional:      General: He is not in acute distress.    Appearance: Normal appearance. He is obese. He is not ill-appearing, toxic-appearing or diaphoretic.  HENT:     Head: Normocephalic.     Right Ear: External ear normal.     Left Ear: External ear normal.     Nose: Nose normal. No congestion or rhinorrhea.     Mouth/Throat:     Mouth: Mucous membranes are moist.  Eyes:     General:        Right eye: No discharge.        Left eye: No discharge.     Extraocular Movements: Extraocular movements intact.     Conjunctiva/sclera: Conjunctivae normal.     Pupils: Pupils are  equal, round, and reactive to light.  Cardiovascular:     Rate and Rhythm: Normal rate and regular rhythm.     Heart sounds: No murmur heard. Pulmonary:     Effort: Pulmonary effort is normal. No respiratory distress.     Breath sounds: Normal breath sounds. No wheezing, rhonchi or rales.  Abdominal:     General: Abdomen is flat. Bowel sounds are normal.  Musculoskeletal:     Cervical back: Normal range of motion and neck supple.  Skin:    General: Skin is warm and dry.     Capillary Refill: Capillary refill takes less than 2  seconds.  Neurological:     General: No focal deficit present.     Mental Status: He is alert and oriented to person, place, and time.  Psychiatric:        Mood and Affect: Mood normal.        Behavior: Behavior normal.        Thought Content: Thought content normal.        Judgment: Judgment normal.     Results for orders placed or performed in visit on 05/02/23  HM DIABETES EYE EXAM  Result Value Ref Range   HM Diabetic Eye Exam No Retinopathy No Retinopathy      Assessment & Plan:   Problem List Items Addressed This Visit       Endocrine   Controlled diabetes mellitus type 2 with complications (HCC)    Chronic.  Controlled without medication.  Up to date on Eye exam and foot exam.  Labs ordered today.  Return to clinic in 6 months for reevaluation.  Call sooner if concerns arise.        Relevant Orders   Comp Met (CMET)   HgB A1c     Other   Anxiety - Primary    Chronic.  Controlled.  Continue with current medication regimen of Clonazepam daily.  Controlled substance agreement and UDS up to date.  Understands risks of taking Benzodiazepine and okay with continuing. Labs ordered today.  Return to clinic in 3 months for reevaluation.  Call sooner if concerns arise.        Relevant Medications   clonazePAM (KLONOPIN) 1 MG tablet   Long-term current use of benzodiazepine    Refer to anxiety plan of care -- UDS today and obtain controlled subs contract up to date.      Hyperlipidemia    Chronic.  Controlled.  Continue with current medication regimen of Atorvastatin.  Labs ordered today.  Return to clinic in 6 months for reevaluation.  Call sooner if concerns arise.          Follow up plan: Return in about 3 months (around 08/02/2023) for Medication Management.

## 2023-05-02 NOTE — Assessment & Plan Note (Signed)
Chronic.  Controlled without medication.  Up to date on Eye exam and foot exam.  Labs ordered today.  Return to clinic in 6 months for reevaluation.  Call sooner if concerns arise.

## 2023-05-02 NOTE — Assessment & Plan Note (Signed)
Refer to anxiety plan of care -- UDS today and obtain controlled subs contract up to date. 

## 2023-05-02 NOTE — Assessment & Plan Note (Signed)
Chronic.  Controlled.  Continue with current medication regimen of Clonazepam daily.  Controlled substance agreement and UDS up to date.  Understands risks of taking Benzodiazepine and okay with continuing. Labs ordered today.  Return to clinic in 3 months for reevaluation.  Call sooner if concerns arise.   

## 2023-05-03 ENCOUNTER — Ambulatory Visit: Payer: Commercial Managed Care - HMO | Admitting: Dermatology

## 2023-05-03 ENCOUNTER — Ambulatory Visit: Payer: Commercial Managed Care - HMO | Admitting: Nurse Practitioner

## 2023-05-03 DIAGNOSIS — D485 Neoplasm of uncertain behavior of skin: Secondary | ICD-10-CM

## 2023-05-03 DIAGNOSIS — X32XXXA Exposure to sunlight, initial encounter: Secondary | ICD-10-CM

## 2023-05-03 DIAGNOSIS — C44519 Basal cell carcinoma of skin of other part of trunk: Secondary | ICD-10-CM

## 2023-05-03 DIAGNOSIS — W908XXA Exposure to other nonionizing radiation, initial encounter: Secondary | ICD-10-CM | POA: Diagnosis not present

## 2023-05-03 DIAGNOSIS — R238 Other skin changes: Secondary | ICD-10-CM

## 2023-05-03 DIAGNOSIS — L578 Other skin changes due to chronic exposure to nonionizing radiation: Secondary | ICD-10-CM

## 2023-05-03 DIAGNOSIS — C4491 Basal cell carcinoma of skin, unspecified: Secondary | ICD-10-CM

## 2023-05-03 HISTORY — DX: Basal cell carcinoma of skin, unspecified: C44.91

## 2023-05-03 LAB — COMPREHENSIVE METABOLIC PANEL
ALT: 24 IU/L (ref 0–44)
AST: 20 IU/L (ref 0–40)
Albumin/Globulin Ratio: 1.6 (ref 1.2–2.2)
Albumin: 4.4 g/dL (ref 3.8–4.9)
Alkaline Phosphatase: 107 IU/L (ref 44–121)
BUN/Creatinine Ratio: 13 (ref 9–20)
BUN: 13 mg/dL (ref 6–24)
Bilirubin Total: 0.3 mg/dL (ref 0.0–1.2)
CO2: 20 mmol/L (ref 20–29)
Calcium: 10 mg/dL (ref 8.7–10.2)
Chloride: 102 mmol/L (ref 96–106)
Creatinine, Ser: 1.04 mg/dL (ref 0.76–1.27)
Globulin, Total: 2.7 g/dL (ref 1.5–4.5)
Glucose: 128 mg/dL — ABNORMAL HIGH (ref 70–99)
Potassium: 4.3 mmol/L (ref 3.5–5.2)
Sodium: 139 mmol/L (ref 134–144)
Total Protein: 7.1 g/dL (ref 6.0–8.5)
eGFR: 85 mL/min/{1.73_m2} (ref >59)

## 2023-05-03 LAB — HEMOGLOBIN A1C
Est. average glucose Bld gHb Est-mCnc: 177 mg/dL
Hgb A1c MFr Bld: 7.8 % — ABNORMAL HIGH (ref 4.8–5.6)

## 2023-05-03 NOTE — Progress Notes (Signed)
   New Patient Visit   Subjective  Johnny Rios is a 57 y.o. male who presents for the following: Spots of chest, left arm, left shoulder that are growing and bleeding if hit The patient has spots, moles and lesions to be evaluated, some may be new or changing and the patient may have concern these could be cancer.  The following portions of the chart were reviewed this encounter and updated as appropriate: medications, allergies, medical history  Review of Systems:  No other skin or systemic complaints except as noted in HPI or Assessment and Plan.  Objective  Well appearing patient in no apparent distress; mood and affect are within normal limits. A focused examination was performed of the following areas: Relevant exam findings are noted in the Assessment and Plan.  Left chest 3.2 cm exophytic nodule   Assessment & Plan   ACTINIC DAMAGE - chronic, secondary to cumulative UV radiation exposure/sun exposure over time - diffuse scaly erythematous macules with underlying dyspigmentation - Recommend daily broad spectrum sunscreen SPF 30+ to sun-exposed areas, reapply every 2 hours as needed.  - Recommend staying in the shade or wearing long sleeves, sun glasses (UVA+UVB protection) and wide brim hats (4-inch brim around the entire circumference of the hat). - Call for new or changing lesions.  BCC vs other  Exam: Pink plaques of chest and left shoulder Treatment Plan: Will plan removal on follow up  Neoplasm of uncertain behavior of skin Left chest  Skin excision  Lesion length (cm):  3.2 Lesion width (cm):  3.2 Margin per side (cm):  0 Total excision diameter (cm):  3.2 Informed consent: discussed and consent obtained   Timeout: patient name, date of birth, surgical site, and procedure verified   Procedure prep:  Patient was prepped and draped in usual sterile fashion Prep type:  Isopropyl alcohol and povidone-iodine Anesthesia: the lesion was anesthetized in a  standard fashion   Anesthetic:  1% lidocaine w/ epinephrine 1-100,000 buffered w/ 8.4% NaHCO3 Instrument used: #15 blade   Hemostasis achieved with: pressure   Hemostasis achieved with comment:  Electrocautery Outcome: patient tolerated procedure well with no complications   Post-procedure details: sterile dressing applied and wound care instructions given   Dressing type: bandage and pressure dressing (mupirocin)    Specimen 1 - Surgical pathology Differential Diagnosis: SCC vs BCC vs other  Check Margins: No   Return in 1 week (on 05/10/2023) for Biopsy Follow up, Follow up as scheduled.  I, Joanie Coddington, CMA, am acting as scribe for Armida Sans, MD .  Documentation: I have reviewed the above documentation for accuracy and completeness, and I agree with the above.  Armida Sans, MD

## 2023-05-03 NOTE — Progress Notes (Signed)
Hi Johnny Rios. It was nice to see you yesterday.  Your lab work looks good.  Your A1c did increase to 7.8%.  If you increase again, we will need to discuss medication options.  I recommend a low carb diet and exercise.  No concerns at this time. Continue with your current medication regimen.  Follow up as discussed.  Please let me know if you have any questions.

## 2023-05-03 NOTE — Patient Instructions (Addendum)
Shave Excision Benign Lesion Wound Care Instructions  Leave the original bandage on for 24 hours if possible.  If the bandage becomes soaked or soiled before that time, it is OK to remove it and examine the wound.  A small amount of post-operative bleeding is normal.  If excessive bleeding occurs, remove the bandage, place gauze over the site and apply continuous pressure (no peeking) over the area for 20-30 minutes.  If this does not stop the bleeding, try again for 40 minutes.  If this does not work, please call our clinic as soon as possible (even if after-hours).    Twice a day, cleanse the wound with soap and water.  If a thick crust develops you may use a Q-tip dipped into dilute hydrogen peroxide (mix 1:1 with water) to dissolve it.  Hydrogen peroxide can slow the healing process, so use it only as needed.  After washing, apply Vaseline jelly or Polysporin ointment.  For best healing, the wound should be covered with a layer of ointment at all times.  This may mean re-applying the ointment several times a day.  For open wounds, continue until it has healed.    If you have any swelling, keep the area elevated.  Some redness, tenderness and white or yellow material in the wound is normal healing.  If the area becomes very sore and red, or develops a thick yellow-green material (pus), it may be infected; please notify us.    Wound healing continues for up to one year following surgery.  It is not unusual to experience pain in the scar from time to time during the interval.  If the pain becomes severe or the scar thickens, you should notify the office.  A slight amount of redness in a scar is expected for the first six months.  After six months, the redness subsides and the scar will soften and fade.  The color difference becomes less noticeable with time.  If there are any problems, return for a post-op surgery check at your earliest convenience.  Please call our office for any questions or  concerns.  Due to recent changes in healthcare laws, you may see results of your pathology and/or laboratory studies on MyChart before the doctors have had a chance to review them. We understand that in some cases there may be results that are confusing or concerning to you. Please understand that not all results are received at the same time and often the doctors may need to interpret multiple results in order to provide you with the best plan of care or course of treatment. Therefore, we ask that you please give us 2 business days to thoroughly review all your results before contacting the office for clarification. Should we see a critical lab result, you will be contacted sooner.   If You Need Anything After Your Visit  If you have any questions or concerns for your doctor, please call our main line at [phone] and press option 4 to reach your doctor's medical assistant. If no one answers, please leave a voicemail as directed and we will return your call as soon as possible. Messages left after 4 pm will be answered the following business day.   You may also send us a message via MyChart. We typically respond to MyChart messages within 1-2 business days.  For prescription refills, please ask your pharmacy to contact our office. Our fax number is [phone].  If you have an urgent issue when the clinic is closed that   cannot wait until the next business day, you can page your doctor at the number below.    Please note that while we do our best to be available for urgent issues outside of office hours, we are not available 24/7.   If you have an urgent issue and are unable to reach us, you may choose to seek medical care at your doctor's office, retail clinic, urgent care center, or emergency room.  If you have a medical emergency, please immediately call 911 or go to the emergency department.  Pager Numbers  - Dr. Kowalski: [phone]  - Dr. Moye: [phone]  - Dr. Stewart:  [phone]  In the event of inclement weather, please call our main line at [phone] for an update on the status of any delays or closures.  Dermatology Medication Tips: Please keep the boxes that topical medications come in in order to help keep track of the instructions about where and how to use these. Pharmacies typically print the medication instructions only on the boxes and not directly on the medication tubes.   If your medication is too expensive, please contact our office at [phone] option 4 or send us a message through MyChart.   We are unable to tell what your co-pay for medications will be in advance as this is different depending on your insurance coverage. However, we may be able to find a substitute medication at lower cost or fill out paperwork to get insurance to cover a needed medication.   If a prior authorization is required to get your medication covered by your insurance company, please allow us 1-2 business days to complete this process.  Drug prices often vary depending on where the prescription is filled and some pharmacies may offer cheaper prices.  The website www.goodrx.com contains coupons for medications through different pharmacies. The prices here do not account for what the cost may be with help from insurance (it may be cheaper with your insurance), but the website can give you the price if you did not use any insurance.  - You can print the associated coupon and take it with your prescription to the pharmacy.  - You may also stop by our office during regular business hours and pick up a GoodRx coupon card.  - If you need your prescription sent electronically to a different pharmacy, notify our office through [place] MyChart or by phone at [phone] option 4.     Si Usted Necesita Algo Despus de Su Visita  Tambin puede enviarnos un mensaje a travs de MyChart. Por lo general respondemos a los mensajes de MyChart en el transcurso de 1 a 2  das hbiles.  Para renovar recetas, por favor pida a su farmacia que se ponga en contacto con nuestra oficina. Nuestro nmero de fax es el [phone].  Si tiene un asunto urgente cuando la clnica est cerrada y que no puede esperar hasta el siguiente da hbil, puede llamar/localizar a su doctor(a) al nmero que aparece a continuacin.   Por favor, tenga en cuenta que aunque hacemos todo lo posible para estar disponibles para asuntos urgentes fuera del horario de oficina, no estamos disponibles las 24 horas del da, los 7 das de la semana.   Si tiene un problema urgente y no puede comunicarse con nosotros, puede optar por buscar atencin mdica  en el consultorio de su doctor(a), en una clnica privada, en un centro de atencin urgente o en una sala de emergencias.  Si tiene una emergencia mdica, por favor   llame inmediatamente al 911 o vaya a la sala de emergencias.  Nmeros de bper  - Dr. Kowalski: [phone]  - Dra. Moye: [phone]  - Dra. Stewart: [phone]  En caso de inclemencias del tiempo, por favor llame a nuestra lnea principal al [phone] para una actualizacin sobre el estado de cualquier retraso o cierre.  Consejos para la medicacin en dermatologa: Por favor, guarde las cajas en las que vienen los medicamentos de uso tpico para ayudarle a seguir las instrucciones sobre dnde y cmo usarlos. Las farmacias generalmente imprimen las instrucciones del medicamento slo en las cajas y no directamente en los tubos del medicamento.   Si su medicamento es muy caro, por favor, pngase en contacto con nuestra oficina llamando al [phone] y presione la opcin 4 o envenos un mensaje a travs de MyChart.   No podemos decirle cul ser su copago por los medicamentos por adelantado ya que esto es diferente dependiendo de la cobertura de su seguro. Sin embargo, es posible que podamos encontrar un medicamento sustituto a menor costo o llenar un formulario para que el  seguro cubra el medicamento que se considera necesario.   Si se requiere una autorizacin previa para que su compaa de seguros cubra su medicamento, por favor permtanos de 1 a 2 das hbiles para completar este proceso.  Los precios de los medicamentos varan con frecuencia dependiendo del lugar de dnde se surte la receta y alguna farmacias pueden ofrecer precios ms baratos.  El sitio web www.goodrx.com tiene cupones para medicamentos de diferentes farmacias. Los precios aqu no tienen en cuenta lo que podra costar con la ayuda del seguro (puede ser ms barato con su seguro), pero el sitio web puede darle el precio si no utiliz ningn seguro.  - Puede imprimir el cupn correspondiente y llevarlo con su receta a la farmacia.  - Tambin puede pasar por nuestra oficina durante el horario de atencin regular y recoger una tarjeta de cupones de GoodRx.  - Si necesita que su receta se enve electrnicamente a una farmacia diferente, informe a nuestra oficina a travs de MyChart de [place] o por telfono llamando al [phone] y presione la opcin 4.  

## 2023-05-09 ENCOUNTER — Encounter: Payer: Self-pay | Admitting: Dermatology

## 2023-05-10 ENCOUNTER — Ambulatory Visit: Payer: Commercial Managed Care - HMO | Admitting: Dermatology

## 2023-05-10 ENCOUNTER — Encounter: Payer: Self-pay | Admitting: Nurse Practitioner

## 2023-05-10 ENCOUNTER — Encounter: Payer: Self-pay | Admitting: Dermatology

## 2023-05-10 VITALS — BP 127/82 | HR 79

## 2023-05-10 DIAGNOSIS — Z4800 Encounter for change or removal of nonsurgical wound dressing: Secondary | ICD-10-CM

## 2023-05-10 DIAGNOSIS — Z85828 Personal history of other malignant neoplasm of skin: Secondary | ICD-10-CM

## 2023-05-10 MED ORDER — MUPIROCIN 2 % EX OINT: 1.0000 | TOPICAL_OINTMENT | Freq: Every day | CUTANEOUS | 0 refills | Status: DC

## 2023-05-10 MED ORDER — TRIAMCINOLONE ACETONIDE 0.1 % EX CREA: TOPICAL_CREAM | CUTANEOUS | 0 refills | Status: DC

## 2023-05-10 MED ORDER — DOXYCYCLINE MONOHYDRATE 100 MG PO CAPS: 100.0000 mg | ORAL_CAPSULE | Freq: Two times a day (BID) | ORAL | 0 refills | Status: AC

## 2023-05-10 NOTE — Patient Instructions (Addendum)
For wound at chest  Continue covering with non stick gauze   Apply mupirocin 2 % ointment to affected wound daily and cover  Start triamcinolone cream 0.1 % - apply to red rash around wound twice daily as needed until redness goes away.  Avoid applying to face, groin, and axilla. Use as directed. Long-term use can cause thinning of the skin.  Topical steroids (such as triamcinolone, fluocinolone, fluocinonide, mometasone, clobetasol, halobetasol, betamethasone, hydrocortisone) can cause thinning and lightening of the skin if they are used for too long in the same area. Your physician has selected the right strength medicine for your problem and area affected on the body. Please use your medication only as directed by your physician to prevent side effects.   Start doxycycline 100 mg tab - take 1 pill twice daily with food and drink   Doxycycline should be taken with food to prevent nausea. Do not lay down for 30 minutes after taking. Be cautious with sun exposure and use good sun protection while on this medication. Pregnant women should not take this medication.      Due to recent changes in healthcare laws, you may see results of your pathology and/or laboratory studies on MyChart before the doctors have had a chance to review them. We understand that in some cases there may be results that are confusing or concerning to you. Please understand that not all results are received at the same time and often the doctors may need to interpret multiple results in order to provide you with the best plan of care or course of treatment. Therefore, we ask that you please give [place] 2 business days to thoroughly review all your results before contacting the office for clarification. Should we see a critical lab result, you will be contacted sooner.   If You Need Anything After Your Visit  If you have any questions or concerns for your doctor, please call our main line at [phone] and press option 4 to  reach your doctor's medical assistant. If no one answers, please leave a voicemail as directed and we will return your call as soon as possible. Messages left after 4 pm will be answered the following business day.   You may also send [place] a message via MyChart. We typically respond to MyChart messages within 1-2 business days.  For prescription refills, please ask your pharmacy to contact our office. Our fax number is [phone].  If you have an urgent issue when the clinic is closed that cannot wait until the next business day, you can page your doctor at the number below.    Please note that while we do our best to be available for urgent issues outside of office hours, we are not available 24/7.   If you have an urgent issue and are unable to reach [place], you may choose to seek medical care at your doctor's office, retail clinic, urgent care center, or emergency room.  If you have a medical emergency, please immediately call 911 or go to the emergency department.  Pager Numbers  - Dr. Gwen Pounds: [phone]  - Dr. Neale Burly: [phone]  - Dr. Roseanne Reno: [phone]  In the event of inclement weather, please call our main line at [phone] for an update on the status of any delays or closures.  Dermatology Medication Tips: Please keep the boxes that topical medications come in in order to help keep track of the instructions about where and how to use these. Pharmacies typically print the medication instructions only  on the boxes and not directly on the medication tubes.   If your medication is too expensive, please contact our office at [phone] option 4 or send [place] a message through MyChart.   We are unable to tell what your co-pay for medications will be in advance as this is different depending on your insurance coverage. However, we may be able to find a substitute medication at lower cost or fill out paperwork to get insurance to cover a needed medication.   If a prior  authorization is required to get your medication covered by your insurance company, please allow [place] 1-2 business days to complete this process.  Drug prices often vary depending on where the prescription is filled and some pharmacies may offer cheaper prices.  The website www.goodrx.com contains coupons for medications through different pharmacies. The prices here do not account for what the cost may be with help from insurance (it may be cheaper with your insurance), but the website can give you the price if you did not use any insurance.  - You can print the associated coupon and take it with your prescription to the pharmacy.  - You may also stop by our office during regular business hours and pick up a GoodRx coupon card.  - If you need your prescription sent electronically to a different pharmacy, notify our office through [facility] or by phone at [phone] option 4.     Si Usted Necesita Algo Despus de Su Visita  Tambin puede enviarnos un mensaje a travs de Clinical cytogeneticist. Por lo general respondemos a los mensajes de MyChart en el transcurso de 1 a 2 das hbiles.  Para renovar recetas, por favor pida a su farmacia que se ponga en contacto con nuestra oficina. Annie Sable de fax es [place] [phone].  Si tiene un asunto urgente cuando la clnica est cerrada y que no puede esperar hasta el siguiente da hbil, puede llamar/localizar a su doctor(a) al nmero que aparece a continuacin.   Por favor, tenga en cuenta que aunque hacemos todo lo posible para estar disponibles para asuntos urgentes fuera del horario de [place], no estamos disponibles las 24 horas del da, los 7 [address]  [address] de la [place].   Si tiene un problema urgente y no puede comunicarse con nosotros, puede optar por buscar atencin mdica  en el consultorio de su doctor(a), en una clnica privada, en un centro de atencin urgente o en una sala de emergencias.  Si tiene Engineer, drilling, por favor llame  inmediatamente al 911 o vaya a la sala de emergencias.  Nmeros de bper  - Dr. Gwen Pounds: [phone]  - Dra. Moye: [phone]  - Dra. Roseanne Reno: [phone]  En caso de inclemencias del [place], por favor llame a Lacy Duverney principal al [phone] para una actualizacin sobre el [place] de cualquier retraso o cierre.  Consejos para la medicacin en dermatologa: Por favor, guarde las cajas en las que vienen los medicamentos de uso tpico para ayudarle a seguir las instrucciones sobre dnde y cmo usarlos. Las farmacias generalmente imprimen las instrucciones del medicamento slo en las cajas y no directamente en los tubos del [place].   Si su medicamento es muy caro, por favor, pngase en contacto con Rolm [place]ala llamando al [phone] y presione la opcin 4 o envenos un mensaje a travs de Clinical cytogeneticist.   No podemos decirle cul ser su copago por los medicamentos por adelantado ya que esto es diferente dependiendo de la cobertura de su seguro. Sin embargo, es posible  que podamos encontrar un medicamento sustituto a Electrical engineer un formulario para que el seguro cubra el medicamento que se considera necesario.   Si se requiere una autorizacin previa para que su compaa de seguros [place] su medicamento, por favor permtanos de 1 a 2 das hbiles para completar este proceso.  Los precios de los medicamentos varan con frecuencia dependiendo del Environmental consultant de dnde se surte la receta y alguna farmacias pueden ofrecer precios ms baratos.  El sitio web www.goodrx.com tiene cupones para medicamentos de Airline pilot. Los precios aqu no tienen en cuenta lo que podra costar con la ayuda del seguro (puede ser ms barato con su seguro), pero el sitio web puede darle el precio si no utiliz Research scientist (physical sciences).  - Puede imprimir el cupn correspondiente y llevarlo con su receta a la farmacia.  - Tambin puede pasar por nuestra oficina durante el horario de atencin regular y Charity fundraiser  una tarjeta de cupones de GoodRx.  - Si necesita que su receta se enve electrnicamente a una farmacia diferente, informe a nuestra oficina a travs de MyChart de [place] o por telfono llamando al [phone] y presione la opcin 4.

## 2023-05-10 NOTE — Progress Notes (Signed)
   Follow-Up Visit   Subjective  Johnny Rios is a 57 y.o. male who presents for the following: Bx proven BCC, Patient presents for suture removal.  Patient reports used antibacterial bandage to cover and has developed a red rash at area.  Pathology showed Basal Cell Carcinoma Nodular and infiltrative Patterns, Base involved  The following portions of the chart were reviewed this encounter and updated as appropriate: medications, allergies, medical history  Review of Systems:  No other skin or systemic complaints except as noted in HPI or Assessment and Plan.  Objective  Well appearing patient in no apparent distress; mood and affect are within normal limits. Areas Examined: Left chest Relevant physical exam findings are noted in the Assessment and Plan.   Assessment & Plan   Bx proven BCC at Left Chest Status Post Excision Exam: healing wound  Treatment Plan: Encounter for Removal of Sutures - Incision site is clean, dry and intact. - Wound cleansed, sutures removed, wound cleansed and steri strips applied.  - Discussed pathology results showing basal cell carcinoma, nodular and infiltrative patterns, based involved - Patient advised to keep steri-strips dry until they fall off. - Scars remodel for a full year. - Once steri-strips fall off, patient can apply over-the-counter silicone scar cream once to twice a day to help with scar remodeling if desired. - Patient advised to call with any concerns or if they notice any new or changing lesions.  Continue mupirocin ointment qd to wound cover with non stick guaze  Sample given Start Targadox 50 mg 2 po bid with food x 1 day After finishing samples of Targadox start Doxycycline 100 mg capsule - take 1 capsule by mouth twice daily with food and drink for 10 days.  Start TMC 0.1% cream - apply topically to red rash at aa bid until clear prn.  Avoid applying to face, groin, and axilla. Use as directed. Long-term use can cause  thinning of the skin.  Topical steroids (such as triamcinolone, fluocinolone, fluocinonide, mometasone, clobetasol, halobetasol, betamethasone, hydrocortisone) can cause thinning and lightening of the skin if they are used for too long in the same area. Your physician has selected the right strength medicine for your problem and area affected on the body. Please use your medication only as directed by your physician to prevent side effects.   Doxycycline should be taken with food to prevent nausea. Do not lay down for 30 minutes after taking. Be cautious with sun exposure and use good sun protection while on this medication. Pregnant women should not take this medication.    Encounter for change or removal of nonsurgical wound dressing  Related Medications mupirocin ointment (BACTROBAN) 2 % Apply 1 Application topically daily. Apply to wound at chest cover with non stick gauze until healed.  doxycycline (MONODOX) 100 MG capsule Take 1 capsule (100 mg total) by mouth 2 (two) times daily for 10 days. Take with food and drink.  triamcinolone cream (KENALOG) 0.1 % Apply topically to affected red rash twice daily prn until clear. Avoid applying to face, groin, and axilla. Use as directed. Long-term use can cause thinning of the skin.   Return for keep follow up as scheduled .  IAsher Muir, CMA, am acting as scribe for Armida Sans, MD. Documentation: I have reviewed the above documentation for accuracy and completeness, and I agree with the above.  Armida Sans, MD

## 2023-05-16 ENCOUNTER — Ambulatory Visit (INDEPENDENT_AMBULATORY_CARE_PROVIDER_SITE_OTHER): Payer: Commercial Managed Care - HMO | Admitting: Dermatology

## 2023-05-16 VITALS — BP 123/80 | HR 89

## 2023-05-16 DIAGNOSIS — C44619 Basal cell carcinoma of skin of left upper limb, including shoulder: Secondary | ICD-10-CM

## 2023-05-16 DIAGNOSIS — W908XXA Exposure to other nonionizing radiation, initial encounter: Secondary | ICD-10-CM

## 2023-05-16 DIAGNOSIS — C44612 Basal cell carcinoma of skin of right upper limb, including shoulder: Secondary | ICD-10-CM

## 2023-05-16 DIAGNOSIS — L578 Other skin changes due to chronic exposure to nonionizing radiation: Secondary | ICD-10-CM | POA: Diagnosis not present

## 2023-05-16 DIAGNOSIS — C4491 Basal cell carcinoma of skin, unspecified: Secondary | ICD-10-CM

## 2023-05-16 DIAGNOSIS — D492 Neoplasm of unspecified behavior of bone, soft tissue, and skin: Secondary | ICD-10-CM

## 2023-05-16 DIAGNOSIS — C44519 Basal cell carcinoma of skin of other part of trunk: Secondary | ICD-10-CM

## 2023-05-16 DIAGNOSIS — X32XXXA Exposure to sunlight, initial encounter: Secondary | ICD-10-CM

## 2023-05-16 HISTORY — DX: Basal cell carcinoma of skin, unspecified: C44.91

## 2023-05-16 NOTE — Progress Notes (Signed)
Follow-Up Visit   Subjective  Johnny Rios is a 57 y.o. male who presents for the following: multiple areas to check chest, shoulder The patient has spots, moles and lesions to be evaluated, some may be new or changing and the patient may have concern these could be cancer.  The following portions of the chart were reviewed this encounter and updated as appropriate: medications, allergies, medical history  Review of Systems:  No other skin or systemic complaints except as noted in HPI or Assessment and Plan.  Objective  Well appearing patient in no apparent distress; mood and affect are within normal limits. A focused examination was performed of the following areas: Chest, arms Relevant exam findings are noted in the Assessment and Plan.  R sup pectoral 1.8cm pearly red plaque     superior sternum 1.8cm pearly red plaque     L dorsum forearm Crusted pap 2.0cm      L top of shoulder   Assessment & Plan    Neoplasm of skin (3) R sup pectoral  Epidermal / dermal shaving  Lesion diameter (cm):  1.8 Informed consent: discussed and consent obtained   Timeout: patient name, date of birth, surgical site, and procedure verified   Procedure prep:  Patient was prepped and draped in usual sterile fashion Prep type:  Isopropyl alcohol Anesthesia: the lesion was anesthetized in a standard fashion   Anesthetic:  1% lidocaine w/ epinephrine 1-100,000 buffered w/ 8.4% NaHCO3 Instrument used: flexible razor blade   Hemostasis achieved with: pressure, aluminum chloride and electrodesiccation   Outcome: patient tolerated procedure well   Post-procedure details: sterile dressing applied and wound care instructions given   Dressing type: bandage and bacitracin    Destruction of lesion Complexity: extensive   Destruction method: electrodesiccation and curettage   Informed consent: discussed and consent obtained   Timeout:  patient name, date of birth, surgical site,  and procedure verified Procedure prep:  Patient was prepped and draped in usual sterile fashion Prep type:  Isopropyl alcohol Anesthesia: the lesion was anesthetized in a standard fashion   Anesthetic:  1% lidocaine w/ epinephrine 1-100,000 buffered w/ 8.4% NaHCO3 Curettage performed in three different directions: Yes   Electrodesiccation performed over the curetted area: Yes   Lesion length (cm):  1.8 Lesion width (cm):  1.8 Margin per side (cm):  0.2 Final wound size (cm):  2.2 Hemostasis achieved with:  pressure, aluminum chloride and electrodesiccation Outcome: patient tolerated procedure well with no complications   Post-procedure details: sterile dressing applied and wound care instructions given   Dressing type: bandage and bacitracin    Specimen 1 - Surgical pathology Differential Diagnosis: D48.5 R/O BCC  Check Margins: yes 1.8cm pearly red plaque EDC  superior sternum  Epidermal / dermal shaving  Lesion diameter (cm):  1.8 Informed consent: discussed and consent obtained   Timeout: patient name, date of birth, surgical site, and procedure verified   Procedure prep:  Patient was prepped and draped in usual sterile fashion Prep type:  Isopropyl alcohol Anesthesia: the lesion was anesthetized in a standard fashion   Anesthetic:  1% lidocaine w/ epinephrine 1-100,000 buffered w/ 8.4% NaHCO3 Instrument used: flexible razor blade   Hemostasis achieved with: pressure, aluminum chloride and electrodesiccation   Outcome: patient tolerated procedure well   Post-procedure details: sterile dressing applied and wound care instructions given   Dressing type: bandage and bacitracin    Destruction of lesion Complexity: extensive   Destruction method: electrodesiccation and curettage  Informed consent: discussed and consent obtained   Timeout:  patient name, date of birth, surgical site, and procedure verified Procedure prep:  Patient was prepped and draped in usual sterile  fashion Prep type:  Isopropyl alcohol Anesthesia: the lesion was anesthetized in a standard fashion   Anesthetic:  1% lidocaine w/ epinephrine 1-100,000 buffered w/ 8.4% NaHCO3 Curettage performed in three different directions: Yes   Electrodesiccation performed over the curetted area: Yes   Lesion length (cm):  1.8 Lesion width (cm):  1.8 Margin per side (cm):  0.2 Final wound size (cm):  2.2 Hemostasis achieved with:  pressure, aluminum chloride and electrodesiccation Outcome: patient tolerated procedure well with no complications   Post-procedure details: sterile dressing applied and wound care instructions given   Dressing type: bandage and bacitracin    Specimen 2 - Surgical pathology Differential Diagnosis: D48.5 R/O BCC  Check Margins: yes 1.8cm pearly red plaque EDC  L dorsum forearm  Epidermal / dermal shaving  Lesion diameter (cm):  2 Informed consent: discussed and consent obtained   Timeout: patient name, date of birth, surgical site, and procedure verified   Procedure prep:  Patient was prepped and draped in usual sterile fashion Prep type:  Isopropyl alcohol Anesthesia: the lesion was anesthetized in a standard fashion   Anesthetic:  1% lidocaine w/ epinephrine 1-100,000 buffered w/ 8.4% NaHCO3 Instrument used: flexible razor blade   Hemostasis achieved with: pressure, aluminum chloride and electrodesiccation   Outcome: patient tolerated procedure well   Post-procedure details: sterile dressing applied and wound care instructions given   Dressing type: bandage and bacitracin    Destruction of lesion Complexity: extensive   Destruction method: electrodesiccation and curettage   Informed consent: discussed and consent obtained   Timeout:  patient name, date of birth, surgical site, and procedure verified Procedure prep:  Patient was prepped and draped in usual sterile fashion Prep type:  Isopropyl alcohol Anesthesia: the lesion was anesthetized in a standard  fashion   Anesthetic:  1% lidocaine w/ epinephrine 1-100,000 buffered w/ 8.4% NaHCO3 Curettage performed in three different directions: Yes   Electrodesiccation performed over the curetted area: Yes   Lesion length (cm):  2 Lesion width (cm):  2 Margin per side (cm):  0.2 Final wound size (cm):  2.4 Hemostasis achieved with:  pressure, aluminum chloride and electrodesiccation Outcome: patient tolerated procedure well with no complications   Post-procedure details: sterile dressing applied and wound care instructions given   Dressing type: bandage, petrolatum and bacitracin    Specimen 3 - Surgical pathology Differential Diagnosis: D48.5 R/O BCC  Check Margins: yes Crusted pap 2.0cm EDC  R/O BCC Exam: pearly red paps L top of shoulder Treatment Plan: Plan excising, will schedule pt for surgery  ACTINIC DAMAGE - chronic, secondary to cumulative UV radiation exposure/sun exposure over time - diffuse scaly erythematous macules with underlying dyspigmentation - Recommend daily broad spectrum sunscreen SPF 30+ to sun-exposed areas, reapply every 2 hours as needed.  - Recommend staying in the shade or wearing long sleeves, sun glasses (UVA+UVB protection) and wide brim hats (4-inch brim around the entire circumference of the hat). - Call for new or changing lesions.  Return for surgery R/O BCC L top of shoulder.  I, Ardis Rowan, RMA, am acting as scribe for Armida Sans, MD .  Documentation: I have reviewed the above documentation for accuracy and completeness, and I agree with the above.  Armida Sans, MD

## 2023-05-16 NOTE — Patient Instructions (Addendum)
Wound Care Instructions  Cleanse wound gently with soap and water once a day then pat dry with clean gauze. Apply a thin coat of Petrolatum (petroleum jelly, "Vaseline") over the wound (unless you have an allergy to this). We recommend that you use a new, sterile tube of Vaseline. Do not pick or remove scabs. Do not remove the yellow or white "healing tissue" from the base of the wound.  Cover the wound with fresh, clean, nonstick gauze and secure with paper tape. You may use Band-Aids in place of gauze and tape if the wound is small enough, but would recommend trimming much of the tape off as there is often too much. Sometimes Band-Aids can irritate the skin.  You should call the office for your biopsy report after 1 week if you have not already been contacted.  If you experience any problems, such as abnormal amounts of bleeding, swelling, significant bruising, significant pain, or evidence of infection, please call the office immediately.  FOR ADULT SURGERY PATIENTS: If you need something for pain relief you may take 1 extra strength Tylenol (acetaminophen) AND 2 Ibuprofen (200mg each) together every 4 hours as needed for pain. (do not take these if you are allergic to them or if you have a reason you should not take them.) Typically, you may only need pain medication for 1 to 3 days.     Pre-Operative Instructions  You are scheduled for a surgical procedure at [facility]. We recommend you read the following instructions. If you have any questions or concerns, please call the office at [phone].  Shower and wash the entire body with soap and water the day of your surgery paying special attention to cleansing at and around the planned surgery site.  Avoid aspirin or aspirin containing products at least fourteen (14) days prior to your surgical procedure and for at least one week (7 Days) after your surgical procedure. If you take aspirin on a regular basis for heart disease or  history of stroke or for any other reason, we may recommend you continue taking aspirin but please notify us if you take this on a regular basis. Aspirin can cause more bleeding to occur during surgery as well as prolonged bleeding and bruising after surgery.   Avoid other nonsteroidal pain medications at least one week prior to surgery and at least one week prior to your surgery. These include medications such as Ibuprofen (Motrin, Advil and Nuprin), Naprosyn, Voltaren, Relafen, etc. If medications are used for therapeutic reasons, please inform us as they can cause increased bleeding or prolonged bleeding during and bruising after surgical procedures.   Please advise us if you are taking any "blood thinner" medications such as Coumadin or Dipyridamole or Plavix or similar medications. These cause increased bleeding and prolonged bleeding during procedures and bruising after surgical procedures. We may have to consider discontinuing these medications briefly prior to and shortly after your surgery if safe to do so.   Please inform us of all medications you are currently taking. All medications that are taken regularly should be taken the day of surgery as you always do. Nevertheless, we need to be informed of what medications you are taking prior to surgery to know whether they will affect the procedure or cause any complications.   Please inform us of any medication allergies. Also inform us of whether you have allergies to Latex or rubber products or whether you have had any adverse reaction to Lidocaine or Epinephrine.  Please inform   us of any prosthetic or artificial body parts such as artificial heart valve, joint replacements, etc., or similar condition that might require preoperative antibiotics.   We recommend avoidance of alcohol at least two weeks prior to surgery and continued avoidance for at least two weeks after surgery.   We recommend discontinuation of tobacco smoking at least two  weeks prior to surgery and continued abstinence for at least two weeks after surgery.  Do not plan strenuous exercise, strenuous work or strenuous lifting for approximately four weeks after your surgery.   We request if you are unable to make your scheduled surgical appointment, please call us at least a week in advance or as soon as you are aware of a problem so that we can cancel or reschedule the appointment.   You MAY TAKE TYLENOL (acetaminophen) for pain as it is not a blood thinner.   PLEASE PLAN TO BE IN TOWN FOR TWO WEEKS FOLLOWING SURGERY, THIS IS IMPORTANT SO YOU CAN BE CHECKED FOR DRESSING CHANGES, SUTURE REMOVAL AND TO MONITOR FOR POSSIBLE COMPLICATIONS.    Due to recent changes in healthcare laws, you may see results of your pathology and/or laboratory studies on MyChart before the doctors have had a chance to review them. We understand that in some cases there may be results that are confusing or concerning to you. Please understand that not all results are received at the same time and often the doctors may need to interpret multiple results in order to provide you with the best plan of care or course of treatment. Therefore, we ask that you please give us 2 business days to thoroughly review all your results before contacting the office for clarification. Should we see a critical lab result, you will be contacted sooner.   If You Need Anything After Your Visit  If you have any questions or concerns for your doctor, please call our main line at [phone] and press option 4 to reach your doctor's medical assistant. If no one answers, please leave a voicemail as directed and we will return your call as soon as possible. Messages left after 4 pm will be answered the following business day.   You may also send us a message via MyChart. We typically respond to MyChart messages within 1-2 business days.  For prescription refills, please ask your pharmacy to contact our office. Our fax  number is [phone].  If you have an urgent issue when the clinic is closed that cannot wait until the next business day, you can page your doctor at the number below.    Please note that while we do our best to be available for urgent issues outside of office hours, we are not available 24/7.   If you have an urgent issue and are unable to reach us, you may choose to seek medical care at your doctor's office, retail clinic, urgent care center, or emergency room.  If you have a medical emergency, please immediately call 911 or go to the emergency department.  Pager Numbers  - Dr. Kowalski: [phone]  - Dr. Moye: [phone]  - Dr. Stewart: [phone]  In the event of inclement weather, please call our main line at [phone] for an update on the status of any delays or closures.  Dermatology Medication Tips: Please keep the boxes that topical medications come in in order to help keep track of the instructions about where and how to use these. Pharmacies typically print the medication instructions only on the boxes and not   directly on the medication tubes.   If your medication is too expensive, please contact our office at [phone] option 4 or send us a message through MyChart.   We are unable to tell what your co-pay for medications will be in advance as this is different depending on your insurance coverage. However, we may be able to find a substitute medication at lower cost or fill out paperwork to get insurance to cover a needed medication.   If a prior authorization is required to get your medication covered by your insurance company, please allow us 1-2 business days to complete this process.  Drug prices often vary depending on where the prescription is filled and some pharmacies may offer cheaper prices.  The website www.goodrx.com contains coupons for medications through different pharmacies. The prices here do not account for what the cost may be with help  from insurance (it may be cheaper with your insurance), but the website can give you the price if you did not use any insurance.  - You can print the associated coupon and take it with your prescription to the pharmacy.  - You may also stop by our office during regular business hours and pick up a GoodRx coupon card.  - If you need your prescription sent electronically to a different pharmacy, notify our office through [facility] MyChart or by phone at [phone] option 4.     Si Usted Necesita Algo Despus de Su Visita  Tambin puede enviarnos un mensaje a travs de MyChart. Por lo general respondemos a los mensajes de MyChart en el transcurso de 1 a 2 das hbiles.  Para renovar recetas, por favor pida a su farmacia que se ponga en contacto con nuestra oficina. Nuestro nmero de fax es el [phone].  Si tiene un asunto urgente cuando la clnica est cerrada y que no puede esperar hasta el siguiente da hbil, puede llamar/localizar a su doctor(a) al nmero que aparece a continuacin.   Por favor, tenga en cuenta que aunque hacemos todo lo posible para estar disponibles para asuntos urgentes fuera del horario de oficina, no estamos disponibles las 24 horas del da, los 7 das de la semana.   Si tiene un problema urgente y no puede comunicarse con nosotros, puede optar por buscar atencin mdica  en el consultorio de su doctor(a), en una clnica privada, en un centro de atencin urgente o en una sala de emergencias.  Si tiene una emergencia mdica, por favor llame inmediatamente al 911 o vaya a la sala de emergencias.  Nmeros de bper  - Dr. Kowalski: [phone]  - Dra. Moye: [phone]  - Dra. Stewart: [phone]  En caso de inclemencias del tiempo, por favor llame a nuestra lnea principal al [phone] para una actualizacin sobre el estado de cualquier retraso o cierre.  Consejos para la medicacin en dermatologa: Por favor, guarde las cajas en las que vienen los  medicamentos de uso tpico para ayudarle a seguir las instrucciones sobre dnde y cmo usarlos. Las farmacias generalmente imprimen las instrucciones del medicamento slo en las cajas y no directamente en los tubos del medicamento.   Si su medicamento es muy caro, por favor, pngase en contacto con nuestra oficina llamando al [phone] y presione la opcin 4 o envenos un mensaje a travs de MyChart.   No podemos decirle cul ser su copago por los medicamentos por adelantado ya que esto es diferente dependiendo de la cobertura de su seguro. Sin embargo, es posible que podamos encontrar un medicamento   sustituto a menor costo o llenar un formulario para que el seguro cubra el medicamento que se considera necesario.   Si se requiere una autorizacin previa para que su compaa de seguros cubra su medicamento, por favor permtanos de 1 a 2 das hbiles para completar este proceso.  Los precios de los medicamentos varan con frecuencia dependiendo del lugar de dnde se surte la receta y alguna farmacias pueden ofrecer precios ms baratos.  El sitio web www.goodrx.com tiene cupones para medicamentos de diferentes farmacias. Los precios aqu no tienen en cuenta lo que podra costar con la ayuda del seguro (puede ser ms barato con su seguro), pero el sitio web puede darle el precio si no utiliz ningn seguro.  - Puede imprimir el cupn correspondiente y llevarlo con su receta a la farmacia.  - Tambin puede pasar por nuestra oficina durante el horario de atencin regular y recoger una tarjeta de cupones de GoodRx.  - Si necesita que su receta se enve electrnicamente a una farmacia diferente, informe a nuestra oficina a travs de MyChart de [place] o por telfono llamando al [phone] y presione la opcin 4.  

## 2023-05-24 ENCOUNTER — Encounter: Payer: Self-pay | Admitting: Dermatology

## 2023-05-25 ENCOUNTER — Telehealth: Payer: Self-pay

## 2023-05-25 NOTE — Telephone Encounter (Signed)
-----   Message from Deirdre Evener, MD sent at 05/25/2023  1:13 PM EDT ----- Diagnosis 1. Skin (M), R sup pectoral BASAL CELL CARCINOMA, NODULAR PATTERN, DEEP MARGIN INVOLVED 2. Skin (M), superior sternum BASAL CELL CARCINOMA, NODULAR PATTERN, LIMITED MARGINS FREE, SEE DESCRIPTION 3. Skin (M), L dorsum forearm BASAL CELL CARCINOMA, NODULAR PATTERN, MARGIN CLOSE, SEE DESCRIPTION  1,2,3 - all three Cancer = BCC All already treated Recheck next visit

## 2023-05-25 NOTE — Telephone Encounter (Signed)
Patient informed of pathology results 

## 2023-05-26 ENCOUNTER — Encounter: Payer: Self-pay | Admitting: Dermatology

## 2023-06-06 ENCOUNTER — Ambulatory Visit (INDEPENDENT_AMBULATORY_CARE_PROVIDER_SITE_OTHER): Payer: Commercial Managed Care - HMO | Admitting: Dermatology

## 2023-06-06 VITALS — BP 122/82

## 2023-06-06 DIAGNOSIS — C44619 Basal cell carcinoma of skin of left upper limb, including shoulder: Secondary | ICD-10-CM

## 2023-06-06 DIAGNOSIS — D485 Neoplasm of uncertain behavior of skin: Secondary | ICD-10-CM

## 2023-06-06 MED ORDER — DOXYCYCLINE MONOHYDRATE 100 MG PO CAPS: 100.0000 mg | ORAL_CAPSULE | Freq: Two times a day (BID) | ORAL | 0 refills | Status: AC

## 2023-06-06 NOTE — Patient Instructions (Signed)
Wound Care Instructions  On the day following your surgery, you should begin doing daily dressing changes: Remove the old dressing and discard it. Cleanse the wound gently with tap water. This may be done in the shower or by placing a wet gauze pad directly on the wound and letting it soak for several minutes. It is important to gently remove any dried blood from the wound in order to encourage healing. This may be done by gently rolling a moistened Q-tip on the dried blood. Do not pick at the wound. If the wound should start to bleed, continue cleaning the wound, then place a moist gauze pad on the wound and hold pressure for a few minutes.  Make sure you then dry the skin surrounding the wound completely or the tape will not stick to the skin. Do not use cotton balls on the wound. After the wound is clean and dry, apply the ointment gently with a Q-tip. Cut a non-stick pad to fit the size of the wound. Lay the pad flush to the wound. If the wound is draining, you may want to reinforce it with a small amount of gauze on top of the non-stick pad for a little added compression to the area. Use the tape to seal the area completely. Select from the following with respect to your individual situation: If your wound has been stitched closed: continue the above steps 1-8 at least daily until your sutures are removed. If your wound has been left open to heal: continue steps 1-8 at least daily for the first 3-4 weeks. We would like for you to take a few extra precautions for at least the next week. Sleep with your head elevated on pillows if our wound is on your head. Do not bend over or lift heavy items to reduce the chance of elevated blood pressure to the wound Do not participate in particularly strenuous activities.   Below is a list of dressing supplies you might need.  Cotton-tipped applicators - Q-tips Gauze pads (2x2 and/or 4x4) - All-Purpose Sponges Non-stick dressing material - Telfa Tape -  Paper or Hypafix New and clean tube of petroleum jelly - Vaseline    Comments on Post-Operative Period Slight swelling and redness often appear around the wound. This is normal and will disappear within several days following the surgery. The healing wound will drain a brownish-red-yellow discharge during healing. This is a normal phase of wound healing. As the wound begins to heal, the drainage may increase in amount. Again, this drainage is normal. Notify us if the drainage becomes persistently bloody, excessively swollen, or intensely painful or develops a foul odor or red streaks.  If you should experience mild discomfort during the healing phase, you may take an aspirin-free medication such as Tylenol (acetaminophen). Notify us if the discomfort is severe or persistent. Avoid alcoholic beverages when taking pain medicine.  In Case of Wound Hemorrhage A wound hemorrhage is when the bandage suddenly becomes soaked with bright red blood and flows profusely. If this happens, sit down or lie down with your head elevated. If the wound has a dressing on it, do not remove the dressing. Apply pressure to the existing gauze. If the wound is not covered, use a gauze pad to apply pressure and continue applying the pressure for 20 minutes without peeking. DO NOT COVER THE WOUND WITH A LARGE TOWEL OR WASH CLOTH. Release your hand from the wound site but do not remove the dressing. If the bleeding has stopped,   gently clean around the wound. Leave the dressing in place for 24 hours if possible. This wait time allows the blood vessels to close off so that you do not spark a new round of bleeding by disrupting the newly clotted blood vessels with an immediate dressing change. If the bleeding does not subside, continue to hold pressure. If matters are out of your control, contact an After Hours clinic or go to the Emergency Room.   

## 2023-06-06 NOTE — Progress Notes (Signed)
Follow-Up Visit   Subjective  Johnny Rios is a 57 y.o. male who presents for the following: BCC vs other (L top of shoulder, pt presents for excision).  The following portions of the chart were reviewed this encounter and updated as appropriate:   Tobacco  Allergies  Meds  Problems  Med Hx  Surg Hx  Fam Hx     Review of Systems:  No other skin or systemic complaints except as noted in HPI or Assessment and Plan.  Objective  Well appearing patient in no apparent distress; mood and affect are within normal limits.  A focused examination was performed including left shoulder. Relevant physical exam findings are noted in the Assessment and Plan.  Left top of shoulder Pearly red paps 4.2 x 1.5cm      Assessment & Plan  Neoplasm of uncertain behavior of skin Left top of shoulder  Skin excision  Lesion length (cm):  4.2 Lesion width (cm):  1.5 Margin per side (cm):  0.2 Total excision diameter (cm):  4.6 Informed consent: discussed and consent obtained   Timeout: patient name, date of birth, surgical site, and procedure verified   Procedure prep:  Patient was prepped and draped in usual sterile fashion Prep type:  Isopropyl alcohol and povidone-iodine Anesthesia: the lesion was anesthetized in a standard fashion   Anesthetic:  1% lidocaine w/ epinephrine 1-100,000 buffered w/ 8.4% NaHCO3 (9cc lido w/ epi, 6cc bupivicaine, Total of 15cc) Instrument used: #15 blade   Hemostasis achieved with: pressure   Hemostasis achieved with comment:  Electrocautery Outcome: patient tolerated procedure well with no complications   Post-procedure details: sterile dressing applied and wound care instructions given   Dressing type: bandage, pressure dressing and bacitracin (mupirocin)    Skin repair Complexity:  Complex Final length (cm):  7 Reason for type of repair: reduce tension to allow closure, reduce the risk of dehiscence, infection, and necrosis, reduce subcutaneous  dead space and avoid a hematoma, allow closure of the large defect, preserve normal anatomy, preserve normal anatomical and functional relationships and enhance both functionality and cosmetic results   Undermining: area extensively undermined   Undermining comment:  Undermining defect 1.9cm Subcutaneous layers (deep stitches):  Suture size:  2-0 Suture type: Vicryl (polyglactin 910)   Subcutaneous suture technique: inverted dermal. Fine/surface layer approximation (top stitches):  Suture size:  2-0 Suture type: nylon   Stitches: vertical mattress and simple interrupted   Suture removal (days):  7 Hemostasis achieved with: suture and pressure Outcome: patient tolerated procedure well with no complications   Post-procedure details: sterile dressing applied and wound care instructions given   Dressing type: bandage, pressure dressing and bacitracin (mupirocin)    Specimen 1 - Surgical pathology Differential Diagnosis: BCC vs other  Check Margins: Yes  BCC vs other, excised today Start Mupirocin oint qd to excision site Start Doxycycline 100mg  1 po bid with food and drink x 1 wk Sample of Targadox 50mg  2 po bid, lot [id number] 08/24  Doxycycline should be taken with food to prevent nausea. Do not lay down for 30 minutes after taking. Be cautious with sun exposure and use good sun protection while on this medication. Pregnant women should not take this medication.     Return in about 1 week (around 06/13/2023) for suture removal.  I, Ardis Rowan, RMA, am acting as scribe for Armida Sans, MD . Documentation: I have reviewed the above documentation for accuracy and completeness, and I agree with the above.  Onalee Hua  Nehemiah Massed, MD

## 2023-06-07 ENCOUNTER — Telehealth: Payer: Self-pay

## 2023-06-07 ENCOUNTER — Ambulatory Visit (INDEPENDENT_AMBULATORY_CARE_PROVIDER_SITE_OTHER): Payer: Commercial Managed Care - HMO | Admitting: Dermatology

## 2023-06-07 VITALS — BP 124/80

## 2023-06-07 DIAGNOSIS — Z85828 Personal history of other malignant neoplasm of skin: Secondary | ICD-10-CM

## 2023-06-07 NOTE — Telephone Encounter (Signed)
Patient doing well after yesterdays surgery./sh 

## 2023-06-07 NOTE — Patient Instructions (Signed)
Due to recent changes in healthcare laws, you may see results of your pathology and/or laboratory studies on MyChart before the doctors have had a chance to review them. We understand that in some cases there may be results that are confusing or concerning to you. Please understand that not all results are received at the same time and often the doctors may need to interpret multiple results in order to provide you with the best plan of care or course of treatment. Therefore, we ask that you please give us 2 business days to thoroughly review all your results before contacting the office for clarification. Should we see a critical lab result, you will be contacted sooner.   If You Need Anything After Your Visit  If you have any questions or concerns for your doctor, please call our main line at [phone] and press option 4 to reach your doctor's medical assistant. If no one answers, please leave a voicemail as directed and we will return your call as soon as possible. Messages left after 4 pm will be answered the following business day.   You may also send us a message via MyChart. We typically respond to MyChart messages within 1-2 business days.  For prescription refills, please ask your pharmacy to contact our office. Our fax number is [phone].  If you have an urgent issue when the clinic is closed that cannot wait until the next business day, you can page your doctor at the number below.    Please note that while we do our best to be available for urgent issues outside of office hours, we are not available 24/7.   If you have an urgent issue and are unable to reach us, you may choose to seek medical care at your doctor's office, retail clinic, urgent care center, or emergency room.  If you have a medical emergency, please immediately call 911 or go to the emergency department.  Pager Numbers  - Dr. Kowalski: [phone]  - Dr. Moye: [phone]  - Dr. Stewart:  [phone]  In the event of inclement weather, please call our main line at [phone] for an update on the status of any delays or closures.  Dermatology Medication Tips: Please keep the boxes that topical medications come in in order to help keep track of the instructions about where and how to use these. Pharmacies typically print the medication instructions only on the boxes and not directly on the medication tubes.   If your medication is too expensive, please contact our office at [phone] option 4 or send us a message through MyChart.   We are unable to tell what your co-pay for medications will be in advance as this is different depending on your insurance coverage. However, we may be able to find a substitute medication at lower cost or fill out paperwork to get insurance to cover a needed medication.   If a prior authorization is required to get your medication covered by your insurance company, please allow us 1-2 business days to complete this process.  Drug prices often vary depending on where the prescription is filled and some pharmacies may offer cheaper prices.  The website www.goodrx.com contains coupons for medications through different pharmacies. The prices here do not account for what the cost may be with help from insurance (it may be cheaper with your insurance), but the website can give you the price if you did not use any insurance.  - You can print the associated coupon and take it with   your prescription to the pharmacy.  - You may also stop by our office during regular business hours and pick up a GoodRx coupon card.  - If you need your prescription sent electronically to a different pharmacy, notify our office through [facility] MyChart or by phone at [phone] option 4.     Si Usted Necesita Algo Despus de Su Visita  Tambin puede enviarnos un mensaje a travs de MyChart. Por lo general respondemos a los mensajes de MyChart en el transcurso de 1 a 2  das hbiles.  Para renovar recetas, por favor pida a su farmacia que se ponga en contacto con nuestra oficina. Nuestro nmero de fax es el [phone].  Si tiene un asunto urgente cuando la clnica est cerrada y que no puede esperar hasta el siguiente da hbil, puede llamar/localizar a su doctor(a) al nmero que aparece a continuacin.   Por favor, tenga en cuenta que aunque hacemos todo lo posible para estar disponibles para asuntos urgentes fuera del horario de oficina, no estamos disponibles las 24 horas del da, los 7 das de la semana.   Si tiene un problema urgente y no puede comunicarse con nosotros, puede optar por buscar atencin mdica  en el consultorio de su doctor(a), en una clnica privada, en un centro de atencin urgente o en una sala de emergencias.  Si tiene una emergencia mdica, por favor llame inmediatamente al 911 o vaya a la sala de emergencias.  Nmeros de bper  - Dr. Kowalski: [phone]  - Dra. Moye: [phone]  - Dra. Stewart: [phone]  En caso de inclemencias del tiempo, por favor llame a nuestra lnea principal al [phone] para una actualizacin sobre el estado de cualquier retraso o cierre.  Consejos para la medicacin en dermatologa: Por favor, guarde las cajas en las que vienen los medicamentos de uso tpico para ayudarle a seguir las instrucciones sobre dnde y cmo usarlos. Las farmacias generalmente imprimen las instrucciones del medicamento slo en las cajas y no directamente en los tubos del medicamento.   Si su medicamento es muy caro, por favor, pngase en contacto con nuestra oficina llamando al [phone] y presione la opcin 4 o envenos un mensaje a travs de MyChart.   No podemos decirle cul ser su copago por los medicamentos por adelantado ya que esto es diferente dependiendo de la cobertura de su seguro. Sin embargo, es posible que podamos encontrar un medicamento sustituto a menor costo o llenar un formulario para que el  seguro cubra el medicamento que se considera necesario.   Si se requiere una autorizacin previa para que su compaa de seguros cubra su medicamento, por favor permtanos de 1 a 2 das hbiles para completar este proceso.  Los precios de los medicamentos varan con frecuencia dependiendo del lugar de dnde se surte la receta y alguna farmacias pueden ofrecer precios ms baratos.  El sitio web www.goodrx.com tiene cupones para medicamentos de diferentes farmacias. Los precios aqu no tienen en cuenta lo que podra costar con la ayuda del seguro (puede ser ms barato con su seguro), pero el sitio web puede darle el precio si no utiliz ningn seguro.  - Puede imprimir el cupn correspondiente y llevarlo con su receta a la farmacia.  - Tambin puede pasar por nuestra oficina durante el horario de atencin regular y recoger una tarjeta de cupones de GoodRx.  - Si necesita que su receta se enve electrnicamente a una farmacia diferente, informe a nuestra oficina a travs de MyChart de [facility]   o por telfono llamando al [phone] y presione la opcin 4.  

## 2023-06-07 NOTE — Progress Notes (Signed)
   Follow-Up Visit   Subjective  Johnny Rios is a 57 y.o. male who presents for the following: 1 day status post excisional bx L top of shoulder   The following portions of the chart were reviewed this encounter and updated as appropriate: medications, allergies, medical history  Review of Systems:  No other skin or systemic complaints except as noted in HPI or Assessment and Plan.  Objective  Well appearing patient in no apparent distress; mood and affect are within normal limits.   A focused examination was performed of the following areas: L top of shoulder  Relevant exam findings are noted in the Assessment and Plan.    Assessment & Plan   WOUND DRAINAGE Exam: excision site with normal drainage  Treatment Plan: Cont Mupirocin oint qd with dressing changes Cont Doxycycline 100mg  1 po bid with food and drink     Return for as scheduled for suture removal.  I, Ardis Rowan, RMA, am acting as scribe for Armida Sans, MD .   Documentation: I have reviewed the above documentation for accuracy and completeness, and I agree with the above.  Armida Sans, MD

## 2023-06-09 ENCOUNTER — Encounter: Payer: Self-pay | Admitting: Dermatology

## 2023-06-13 ENCOUNTER — Ambulatory Visit (INDEPENDENT_AMBULATORY_CARE_PROVIDER_SITE_OTHER): Payer: Commercial Managed Care - HMO | Admitting: Dermatology

## 2023-06-13 ENCOUNTER — Encounter: Payer: Self-pay | Admitting: Dermatology

## 2023-06-13 VITALS — BP 120/84 | HR 69

## 2023-06-13 DIAGNOSIS — Z85828 Personal history of other malignant neoplasm of skin: Secondary | ICD-10-CM

## 2023-06-13 NOTE — Patient Instructions (Signed)
Due to recent changes in healthcare laws, you may see results of your pathology and/or laboratory studies on MyChart before the doctors have had a chance to review them. We understand that in some cases there may be results that are confusing or concerning to you. Please understand that not all results are received at the same time and often the doctors may need to interpret multiple results in order to provide you with the best plan of care or course of treatment. Therefore, we ask that you please give us 2 business days to thoroughly review all your results before contacting the office for clarification. Should we see a critical lab result, you will be contacted sooner.   If You Need Anything After Your Visit  If you have any questions or concerns for your doctor, please call our main line at [phone] and press option 4 to reach your doctor's medical assistant. If no one answers, please leave a voicemail as directed and we will return your call as soon as possible. Messages left after 4 pm will be answered the following business day.   You may also send us a message via MyChart. We typically respond to MyChart messages within 1-2 business days.  For prescription refills, please ask your pharmacy to contact our office. Our fax number is [phone].  If you have an urgent issue when the clinic is closed that cannot wait until the next business day, you can page your doctor at the number below.    Please note that while we do our best to be available for urgent issues outside of office hours, we are not available 24/7.   If you have an urgent issue and are unable to reach us, you may choose to seek medical care at your doctor's office, retail clinic, urgent care center, or emergency room.  If you have a medical emergency, please immediately call 911 or go to the emergency department.  Pager Numbers  - Dr. Kowalski: [phone]  - Dr. Moye: [phone]  - Dr. Stewart:  [phone]  In the event of inclement weather, please call our main line at [phone] for an update on the status of any delays or closures.  Dermatology Medication Tips: Please keep the boxes that topical medications come in in order to help keep track of the instructions about where and how to use these. Pharmacies typically print the medication instructions only on the boxes and not directly on the medication tubes.   If your medication is too expensive, please contact our office at [phone] option 4 or send us a message through MyChart.   We are unable to tell what your co-pay for medications will be in advance as this is different depending on your insurance coverage. However, we may be able to find a substitute medication at lower cost or fill out paperwork to get insurance to cover a needed medication.   If a prior authorization is required to get your medication covered by your insurance company, please allow us 1-2 business days to complete this process.  Drug prices often vary depending on where the prescription is filled and some pharmacies may offer cheaper prices.  The website www.goodrx.com contains coupons for medications through different pharmacies. The prices here do not account for what the cost may be with help from insurance (it may be cheaper with your insurance), but the website can give you the price if you did not use any insurance.  - You can print the associated coupon and take it with   your prescription to the pharmacy.  - You may also stop by our office during regular business hours and pick up a GoodRx coupon card.  - If you need your prescription sent electronically to a different pharmacy, notify our office through [facility] MyChart or by phone at [phone] option 4.     Si Usted Necesita Algo Despus de Su Visita  Tambin puede enviarnos un mensaje a travs de MyChart. Por lo general respondemos a los mensajes de MyChart en el transcurso de 1 a 2  das hbiles.  Para renovar recetas, por favor pida a su farmacia que se ponga en contacto con nuestra oficina. Nuestro nmero de fax es el [phone].  Si tiene un asunto urgente cuando la clnica est cerrada y que no puede esperar hasta el siguiente da hbil, puede llamar/localizar a su doctor(a) al nmero que aparece a continuacin.   Por favor, tenga en cuenta que aunque hacemos todo lo posible para estar disponibles para asuntos urgentes fuera del horario de oficina, no estamos disponibles las 24 horas del da, los 7 das de la semana.   Si tiene un problema urgente y no puede comunicarse con nosotros, puede optar por buscar atencin mdica  en el consultorio de su doctor(a), en una clnica privada, en un centro de atencin urgente o en una sala de emergencias.  Si tiene una emergencia mdica, por favor llame inmediatamente al 911 o vaya a la sala de emergencias.  Nmeros de bper  - Dr. Kowalski: [phone]  - Dra. Moye: [phone]  - Dra. Stewart: [phone]  En caso de inclemencias del tiempo, por favor llame a nuestra lnea principal al [phone] para una actualizacin sobre el estado de cualquier retraso o cierre.  Consejos para la medicacin en dermatologa: Por favor, guarde las cajas en las que vienen los medicamentos de uso tpico para ayudarle a seguir las instrucciones sobre dnde y cmo usarlos. Las farmacias generalmente imprimen las instrucciones del medicamento slo en las cajas y no directamente en los tubos del medicamento.   Si su medicamento es muy caro, por favor, pngase en contacto con nuestra oficina llamando al [phone] y presione la opcin 4 o envenos un mensaje a travs de MyChart.   No podemos decirle cul ser su copago por los medicamentos por adelantado ya que esto es diferente dependiendo de la cobertura de su seguro. Sin embargo, es posible que podamos encontrar un medicamento sustituto a menor costo o llenar un formulario para que el  seguro cubra el medicamento que se considera necesario.   Si se requiere una autorizacin previa para que su compaa de seguros cubra su medicamento, por favor permtanos de 1 a 2 das hbiles para completar este proceso.  Los precios de los medicamentos varan con frecuencia dependiendo del lugar de dnde se surte la receta y alguna farmacias pueden ofrecer precios ms baratos.  El sitio web www.goodrx.com tiene cupones para medicamentos de diferentes farmacias. Los precios aqu no tienen en cuenta lo que podra costar con la ayuda del seguro (puede ser ms barato con su seguro), pero el sitio web puede darle el precio si no utiliz ningn seguro.  - Puede imprimir el cupn correspondiente y llevarlo con su receta a la farmacia.  - Tambin puede pasar por nuestra oficina durante el horario de atencin regular y recoger una tarjeta de cupones de GoodRx.  - Si necesita que su receta se enve electrnicamente a una farmacia diferente, informe a nuestra oficina a travs de MyChart de [facility]   o por telfono llamando al [phone] y presione la opcin 4.  

## 2023-06-13 NOTE — Progress Notes (Unsigned)
   Follow-Up Visit   Subjective  Johnny Rios is a 57 y.o. male who presents for the following: Suture removal at the left top of shoulder.  Pathology showed BASAL CELL CARCINOMA, MICRONODULAR PATTERN  The patient presents for Upper Body Skin Exam (UBSE) for skin cancer screening and mole check.    The following portions of the chart were reviewed this encounter and updated as appropriate: medications, allergies, medical history  Review of Systems:  No other skin or systemic complaints except as noted in HPI or Assessment and Plan.  Objective  Well appearing patient in no apparent distress; mood and affect are within normal limits.  Areas Examined: Shoulders  Relevant physical exam findings are noted in the Assessment and Plan.   Assessment & Plan   Encounter for Removal of Sutures - Incision site is clean, dry and intact. - Wound cleansed, sutures removed, wound cleansed and steri strips applied.  - Discussed pathology results showing BASAL CELL CARCINOMA, MICRONODULAR PATTERN  - Patient advised to keep steri-strips dry until they fall off. - Scars remodel for a full year. - Once steri-strips fall off, patient can apply over-the-counter silicone scar cream once to twice a day to help with scar remodeling if desired. - Patient advised to call with any concerns or if they notice any new or changing lesions.  LENTIGINES, SEBORRHEIC KERATOSES, HEMANGIOMAS - Benign normal skin lesions - Benign-appearing - Call for any changes  MELANOCYTIC NEVI - Tan-brown and/or pink-flesh-colored symmetric macules and papules - Benign appearing on exam today - Observation - Call clinic for new or changing moles - Recommend daily use of broad spectrum spf 30+ sunscreen to sun-exposed areas.   ACTINIC DAMAGE - Chronic condition, secondary to cumulative UV/sun exposure - diffuse scaly erythematous macules with underlying dyspigmentation - Recommend daily broad spectrum sunscreen SPF 30+ to  sun-exposed areas, reapply every 2 hours as needed.  - Staying in the shade or wearing long sleeves, sun glasses (UVA+UVB protection) and wide brim hats (4-inch brim around the entire circumference of the hat) are also recommended for sun protection.  - Call for new or changing lesions.  HISTORY OF BASAL CELL CARCINOMA OF THE SKIN - No evidence of recurrence today - Recommend regular full body skin exams - Recommend daily broad spectrum sunscreen SPF 30+ to sun-exposed areas, reapply every 2 hours as needed.  - Call if any new or changing lesions are noted between office visits   SKIN CANCER SCREENING PERFORMED TODAY   Return in about 5 months (around 11/13/2023) for TBSE, hx of BCC.  IAngelique Holm, CMA, am acting as scribe for Armida Sans, MD .   Documentation: I have reviewed the above documentation for accuracy and completeness, and I agree with the above.  Armida Sans, MD

## 2023-06-14 ENCOUNTER — Encounter: Payer: Self-pay | Admitting: Dermatology

## 2023-07-03 ENCOUNTER — Telehealth: Payer: Self-pay

## 2023-07-03 NOTE — Telephone Encounter (Signed)
Pt left vmm to schedule colonoscopy please return call 

## 2023-07-04 ENCOUNTER — Other Ambulatory Visit: Payer: Self-pay | Admitting: *Deleted

## 2023-07-04 ENCOUNTER — Telehealth: Payer: Self-pay | Admitting: *Deleted

## 2023-07-04 DIAGNOSIS — Z86010 Personal history of colonic polyps: Secondary | ICD-10-CM

## 2023-07-04 MED ORDER — PEG 3350-KCL-NABCB-NACL-NASULF 236 G PO SOLR: 4000.0000 mL | Freq: Once | ORAL | 0 refills | Status: AC

## 2023-07-04 NOTE — Telephone Encounter (Signed)
6 months repeat colonoscopy have been schedule for 08/18/2023

## 2023-07-04 NOTE — Telephone Encounter (Signed)
Tried to call patient but I could barely hear him so he hung up the phone.

## 2023-07-04 NOTE — Telephone Encounter (Signed)
Patient called to schedule a repeat colonoscopy. Because he was not clean out well at the last colonoscopy 02/15/2023. He did not do, the second part of the prepping.   Requesting to schedule on 08/18/2023. Request for a different prep solution. Will send [employer] to [employer].  New order and instructions will be sent.

## 2023-07-25 ENCOUNTER — Other Ambulatory Visit: Payer: Self-pay | Admitting: Nurse Practitioner

## 2023-07-25 ENCOUNTER — Telehealth: Payer: Self-pay | Admitting: Nurse Practitioner

## 2023-07-25 DIAGNOSIS — F419 Anxiety disorder, unspecified: Secondary | ICD-10-CM

## 2023-07-25 NOTE — Telephone Encounter (Unsigned)
Copied from CRM [id number]. Topic: General - Other >> Jul 25, 2023  9:56 AM Everette C wrote: Reason for CRM: Medication Refill - Medication: clonazePAM (KLONOPIN) 1 MG tablet [[id number]]  Has the patient contacted their pharmacy? Yes.   (Agent: If no, request that the patient contact the pharmacy for the refill. If patient does not wish to contact the pharmacy document the reason why and proceed with request.) (Agent: If yes, when and what did the pharmacy advise?)  Preferred Pharmacy (with phone number or street name): [facility] - [place], [place] - [address] [place] [address] Phone: [phone] Fax: [phone] Hours: Not open 24 hours    Has the patient been seen for an appointment in the last year OR does the patient have an upcoming appointment? Yes.    Agent: Please be advised that RX refills may take up to 3 business days. We ask that you follow-up with your pharmacy.

## 2023-07-25 NOTE — Telephone Encounter (Signed)
Patient called back to f/u on his refill for clonazePAM (KLONOPIN) 1 MG tablet. He says he has 2 left but will need at least 10 more to get his to his 8/8 appt. Advised to give 24-72 hrs for the refill. Patient ask that someone call him when it has been sent to the pharmacy

## 2023-07-26 ENCOUNTER — Other Ambulatory Visit: Payer: Self-pay | Admitting: Nurse Practitioner

## 2023-07-26 ENCOUNTER — Telehealth: Payer: Self-pay | Admitting: Nurse Practitioner

## 2023-07-26 DIAGNOSIS — F419 Anxiety disorder, unspecified: Secondary | ICD-10-CM

## 2023-07-26 NOTE — Telephone Encounter (Signed)
Requested medication (s) are due for refill today: Yes  Requested medication (s) are on the active medication list: Yes  Last refill:  05/02/23 #30, 2RF  Future visit scheduled: Yes  Notes to clinic:  Unable to refill per protocol, cannot delegate.      Requested Prescriptions  Pending Prescriptions Disp Refills   clonazePAM (KLONOPIN) 1 MG tablet 30 tablet 2    Sig: TAKE ONE TABLET DAILY AS NEEDED.     Not Delegated - Psychiatry: Anxiolytics/Hypnotics 2 Failed - 07/25/2023 10:30 AM      Failed - This refill cannot be delegated      Passed - Urine Drug Screen completed in last 360 days      Passed - Patient is not pregnant      Passed - Valid encounter within last 6 months    Recent Outpatient Visits           2 months ago Anxiety   [place] [facility] Larae Grooms, NP   5 months ago Annual physical exam   [place] [facility] Larae Grooms, NP   8 months ago Diabetes mellitus without complication [facility])   [place] [facility] Larae Grooms, NP   11 months ago Mixed hyperlipidemia   [place] [facility] Larae Grooms, NP   1 year ago Anxiety   [place] [facility] Vigg, Avanti, MD       Future Appointments             In 1 week Mecum, Oswaldo Conroy, PA-C [place] [facility]   In 3 months Deirdre Evener, MD [facility]

## 2023-07-26 NOTE — Telephone Encounter (Signed)
Patient called regarding the refill requested. He said he picked up the last refill on 07/02/23 and he only has 2 pills left. He is asking for a refill to last until he comes in on 07/31/23. Advised I will send the request to [facility].

## 2023-07-26 NOTE — Telephone Encounter (Signed)
Medication Refill - Medication: clonazePAM (KLONOPIN) 1 MG tablet  Denied said refill too soon, but pt Has 2 left  and appt isnt until 08/08  Has the patient contacted their pharmacy? yes (Agent: If no, request that the patient contact the pharmacy for the refill. If patient does not wish to contact the pharmacy document the reason why and proceed with request.) (Agent: If yes, when and what did the pharmacy advise?)contact pcp  Preferred Pharmacy (with phone number or street name):  [facility] [place], [place] - [address] Phone: [phone]  Fax: [phone]     Has the patient been seen for an appointment in the last year OR does the patient have an upcoming appointment? yes  Agent: Please be advised that RX refills may take up to 3 business days. We ask that you follow-up with your pharmacy.

## 2023-07-26 NOTE — Telephone Encounter (Signed)
Requested medication (s) are due for refill today: Yes  Requested medication (s) are on the active medication list: Yes  Last refill:  05/02/23  Future visit scheduled: Yes  Notes to clinic:  Unable to refill per protocol, cannot delegate. See notes from patient, only 2 pills left, no more refills at the pharmacy, forward to [facility] for approval     Requested Prescriptions  Pending Prescriptions Disp Refills   clonazePAM (KLONOPIN) 1 MG tablet [Pharmacy Med Name: CLONAZEPAM 1 MG TABLET] 30 tablet 0    Sig: TAKE ONE TABLET DAILY AS NEEDED.     Not Delegated - Psychiatry: Anxiolytics/Hypnotics 2 Failed - 07/25/2023  1:41 PM      Failed - This refill cannot be delegated      Passed - Urine Drug Screen completed in last 360 days      Passed - Patient is not pregnant      Passed - Valid encounter within last 6 months    Recent Outpatient Visits           2 months ago Anxiety   [place] [facility] Larae Grooms, NP   5 months ago Annual physical exam   [place] [facility] Larae Grooms, NP   8 months ago Diabetes mellitus without complication [facility])   [place] [facility] Larae Grooms, NP   11 months ago Mixed hyperlipidemia   [facility] Larae Grooms, NP   1 year ago Anxiety   [place] [facility] Vigg, Avanti, MD       Future Appointments             In 5 days Mecum, Oswaldo Conroy, PA-C [place] [facility]   In 3 months Deirdre Evener, MD [facility]

## 2023-07-26 NOTE — Telephone Encounter (Signed)
Patient called regarding the refill requested. He said he picked up the last refill on 07/02/23 and he only has 2 pills left. He is asking for a refill to last until he comes in on 07/31/23. Advised I will send the request to [facility]. Already requested by the pharmacy in a refill encounter today. Will send that request to [facility] with the conversation pasted into the notes.

## 2023-07-27 ENCOUNTER — Telehealth: Payer: Self-pay | Admitting: Nurse Practitioner

## 2023-07-27 ENCOUNTER — Other Ambulatory Visit: Payer: Self-pay | Admitting: Physician Assistant

## 2023-07-27 DIAGNOSIS — F419 Anxiety disorder, unspecified: Secondary | ICD-10-CM

## 2023-07-27 MED ORDER — CLONAZEPAM 1 MG PO TABS
ORAL_TABLET | ORAL | 0 refills | Status: DC
Start: 2023-07-27 — End: 2023-07-31

## 2023-07-27 NOTE — Telephone Encounter (Signed)
Patient is calling back again as he has called 3 days to get his clonazePAM (KLONOPIN) 1 MG tablet  sent to physician. He will be taking his last pill today and states he will need at least 6 pills to get him to his appt. Please f/u with patient. Please send to:  [facility] - [place], [place] - [address] Phone: [phone]  Fax: [phone]

## 2023-07-27 NOTE — Telephone Encounter (Signed)
Called [facility]. Last RX was picked up 06/26/23. Can we send in a couple of tablets for the patient to get to his appointment on 07/31/23?

## 2023-07-27 NOTE — Telephone Encounter (Signed)
Patient notified of Erin's message.

## 2023-07-31 ENCOUNTER — Ambulatory Visit (INDEPENDENT_AMBULATORY_CARE_PROVIDER_SITE_OTHER): Payer: Commercial Managed Care - HMO | Admitting: Physician Assistant

## 2023-07-31 VITALS — BP 128/82 | HR 90 | Ht 69.0 in | Wt 280.6 lb

## 2023-07-31 DIAGNOSIS — E785 Hyperlipidemia, unspecified: Secondary | ICD-10-CM

## 2023-07-31 DIAGNOSIS — Z79899 Other long term (current) drug therapy: Secondary | ICD-10-CM

## 2023-07-31 DIAGNOSIS — E118 Type 2 diabetes mellitus with unspecified complications: Secondary | ICD-10-CM | POA: Diagnosis not present

## 2023-07-31 DIAGNOSIS — F419 Anxiety disorder, unspecified: Secondary | ICD-10-CM | POA: Diagnosis not present

## 2023-07-31 MED ORDER — CLONAZEPAM 1 MG PO TABS
1.0000 mg | ORAL_TABLET | Freq: Every day | ORAL | 0 refills | Status: DC | PRN
Start: 2023-10-01 — End: 2023-09-18

## 2023-07-31 MED ORDER — ATORVASTATIN CALCIUM 20 MG PO TABS
20.0000 mg | ORAL_TABLET | Freq: Every day | ORAL | 1 refills | Status: DC
Start: 2023-07-31 — End: 2023-09-18

## 2023-07-31 MED ORDER — CLONAZEPAM 1 MG PO TABS
1.0000 mg | ORAL_TABLET | Freq: Every day | ORAL | 0 refills | Status: DC | PRN
Start: 2023-07-31 — End: 2023-10-31

## 2023-07-31 MED ORDER — CLONAZEPAM 1 MG PO TABS
1.0000 mg | ORAL_TABLET | Freq: Every day | ORAL | 0 refills | Status: DC | PRN
Start: 2023-08-31 — End: 2023-09-18

## 2023-07-31 MED ORDER — ONDANSETRON HCL 4 MG PO TABS: 4.0000 mg | ORAL_TABLET | Freq: Three times a day (TID) | ORAL | 0 refills | Status: DC | PRN

## 2023-07-31 NOTE — Progress Notes (Signed)
Established Patient Office Visit  Name: Johnny Rios   MRN: [id number]    DOB: 07-20-1966   Date:2023-08-07  Today's Provider: Jacquelin Hawking, MHS, PA-C Introduced myself to the patient as a PA-C and provided education on APPs in clinical practice.         Subjective  Chief Complaint  Chief Complaint  Patient presents with   Diabetes   Anxiety   Hyperlipidemia    HPI     HYPERLIPIDEMIA Hyperlipidemia status: good compliance Satisfied with current treatment?  yes Side effects:  no Medication compliance: good compliance Past cholesterol meds: atorvastain (lipitor) Supplements: none Aspirin:  no The ASCVD Risk score (Arnett DK, et al., 2019) failed to calculate for the following reasons:   The patient has a prior MI or stroke diagnosis Chest pain:  no   Diabetes, Type 2 - Last A1c 7.8 - Medications: none  - Compliance: NA - Checking BG at home: not checking at home  - Eye exam: UTD - Foot exam: UTD - Microalbumin: UTD - Statin: on statin therapy  - PNA vaccine: NA - Denies symptoms of hypoglycemia, polyuria, polydipsia, numbness extremities, foot ulcers/trauma  ANXIETY/STRESS He reports some irritability but thinks his mood is well managed with daily Clonazepam  Duration:stable Anxious mood: no  Excessive worrying: no Irritability: no  Sweating: no Nausea: no Palpitations:no Hyperventilation: no Panic attacks: no Agoraphobia: no  Obscessions/compulsions: no Depressed mood: no    08/07/2023    8:21 AM 05/02/2023    3:04 PM 02/02/2023    3:18 PM 11/02/2022    3:28 PM 08/01/2022    3:10 PM  Depression screen PHQ 2/9  Decreased Interest 0 0 0 0 0  Down, Depressed, Hopeless 0 0 0 0 0  PHQ - 2 Score 0 0 0 0 0  Altered sleeping 0 0 0 0 0  Tired, decreased energy 1 2 1  0 1  Change in appetite 0 0 0 0 0  Feeling bad or failure about yourself  0 0 0 0 0  Trouble concentrating 0 0 0 0 0  Moving slowly or fidgety/restless 0 0 0 0 0  Suicidal thoughts 0  0 0 0 0  PHQ-9 Score 1 2 1  0 1  Difficult doing work/chores   Not difficult at all Not difficult at all Not difficult at all   Anhedonia: no Weight changes: yes- reports he has gained weight  Insomnia: no   Hypersomnia: no Fatigue/loss of energy: no Feelings of worthlessness: no Feelings of guilt: no Impaired concentration/indecisiveness: no Suicidal ideations: no  Crying spells: no Recent Stressors/Life Changes: no   Relationship problems: no   Family stress: no     Financial stress: no    Job stress: no    Recent death/loss: no    08/07/2023    8:21 AM 05/02/2023    3:05 PM 02/02/2023    3:19 PM 11/02/2022    3:28 PM  GAD 7 : Generalized Anxiety Score  Nervous, Anxious, on Edge 0 0 0 0  Control/stop worrying 0 0 0 0  Worry too much - different things 0 0 0 0  Trouble relaxing 0 0 0 0  Restless 0 0 0 0  Easily annoyed or irritable 1 3 1 1   Afraid - awful might happen 0  0 0  Total GAD 7 Score 1  1 1   Anxiety Difficulty Not difficult at all  Not difficult at all Not difficult at  all        Patient Active Problem List   Diagnosis Date Noted   History of colonic polyps 02/15/2023   Adenomatous polyp of colon 02/15/2023   Controlled diabetes mellitus type 2 with complications (HCC) 08/01/2022   Hyperlipidemia 11/10/2021   Dizziness 10/20/2021   Erectile dysfunction 10/13/2021   Colon cancer screening    Lipoma of colon    Polyp of sigmoid colon    Polyp of descending colon    SBO (small bowel obstruction) (HCC) 06/14/2021   Long-term current use of benzodiazepine 02/17/2021   Recurrent ventral hernia 07/05/2018   Rectus diastasis 07/05/2018   DDD (degenerative disc disease), lumbar 04/16/2018   Low testosterone 10/09/2017   Elevated glucose 09/26/2017   Chronic fatigue 07/26/2017   PUD (peptic ulcer disease) 10/31/2016   Obesity 12/02/2015   OSA on CPAP 12/02/2015   Anxiety     Past Surgical History:  Procedure Laterality Date   APPENDECTOMY      CHOLECYSTECTOMY N/A 01/19/2017   Procedure: LAPAROSCOPIC CHOLECYSTECTOMY WITH INTRAOPERATIVE CHOLANGIOGRAM;  Surgeon: Earline Mayotte, MD;  Location: [facility] ORS;  Service: General;  Laterality: N/A;   COLONOSCOPY WITH PROPOFOL N/A 09/22/2021   Procedure: COLONOSCOPY WITH PROPOFOL;  Surgeon: Pasty Spillers, MD;  Location: [facility] ENDOSCOPY;  Service: Endoscopy;  Laterality: N/A;   COLONOSCOPY WITH PROPOFOL N/A 02/15/2023   Procedure: COLONOSCOPY WITH PROPOFOL;  Surgeon: Wyline Mood, MD;  Location: [facility] ENDOSCOPY;  Service: Gastroenterology;  Laterality: N/A;   LACERATION REPAIR Left 04/04/2019   Procedure: Repair Multiple Lacerations;  Surgeon: Betha Loa, MD;  Location[facility] OR;  Service: Orthopedics;  Laterality: Left;   MINOR NAILBED REPAIR Left 04/04/2019   Procedure: Minor Nailbed Repair;  Surgeon: Betha Loa, MD;  Location: [facility] OR;  Service: Orthopedics;  Laterality: Left;   NERVE AND TENDON REPAIR Left 04/04/2019   Procedure: IRRIGATION AND DEBRIDEMENT OF FIXATION OF OPEN FRACTURES LONG ,RING FINGER;  Surgeon: Betha Loa, MD;  Location: [facility] OR;  Service: Orthopedics;  Laterality: Left;   TONSILLECTOMY     UMBILICAL HERNIA REPAIR N/A 06/12/2017   Ventral hernia with 6 cm Ventralex ST mesh.   VENTRAL HERNIA REPAIR N/A 07/11/2018   15 x 20 cm intraperitoneal Ventralex mesh  ;  Surgeon: Earline Mayotte, MD;  Location: [facility] ORS;  Service: General;  Laterality: N/A;    Family History  Problem Relation Age of Onset   Cancer Father        lung   Alzheimer's disease Mother    Heart attack Brother    Heart attack Brother     Social History   Tobacco Use   Smoking status: Never   Smokeless tobacco: Former  Substance Use Topics   Alcohol use: Yes    Comment: occasional     Current Outpatient Medications:    esomeprazole (NEXIUM) 20 MG capsule, Take 1 capsule (20 mg total) by mouth every morning., Disp: 90 capsule, Rfl: 2   atorvastatin (LIPITOR) 20 MG tablet, Take 1 tablet (20 mg  total) by mouth daily., Disp: 90 tablet, Rfl: 1   clonazePAM (KLONOPIN) 1 MG tablet, Take 1 tablet (1 mg total) by mouth daily as needed for anxiety. TAKE ONE TABLET DAILY AS NEEDED., Disp: 30 tablet, Rfl: 0   [START ON 08/31/2023] clonazePAM (KLONOPIN) 1 MG tablet, Take 1 tablet (1 mg total) by mouth daily as needed for anxiety. TAKE ONE TABLET DAILY AS NEEDED., Disp: 30 tablet, Rfl: 0   [START ON 10/01/2023] clonazePAM (KLONOPIN)  1 MG tablet, Take 1 tablet (1 mg total) by mouth daily as needed for anxiety. TAKE ONE TABLET DAILY AS NEEDED., Disp: 30 tablet, Rfl: 0   fenofibrate 160 MG tablet, Take 1 tablet (160 mg total) by mouth daily. (Patient not taking: Reported on 05/02/2023), Disp: 90 tablet, Rfl: 1   meloxicam (MOBIC) 7.5 MG tablet, Take 1 tablet every day by oral route for 30 days. (Patient not taking: Reported on 05/03/2023), Disp: , Rfl:    mupirocin ointment (BACTROBAN) 2 %, Apply 1 Application topically daily. Apply to wound at chest cover with non stick gauze until healed. (Patient not taking: Reported on 07/31/2023), Disp: 22 g, Rfl: 0   ondansetron (ZOFRAN) 4 MG tablet, Take 1 tablet (4 mg total) by mouth every 8 (eight) hours as needed for nausea or vomiting., Disp: 20 tablet, Rfl: 0   triamcinolone cream (KENALOG) 0.1 %, Apply topically to affected red rash twice daily prn until clear. Avoid applying to face, groin, and axilla. Use as directed. Long-term use can cause thinning of the skin. (Patient not taking: Reported on 07/31/2023), Disp: 28.4 g, Rfl: 0  No Known Allergies  I personally reviewed active problem list, medication list, allergies, health maintenance, notes from last encounter, lab results with the patient/caregiver today.   Review of Systems  Eyes:  Negative for blurred vision and double vision.  Respiratory:  Negative for shortness of breath and wheezing.   Cardiovascular:  Negative for chest pain, palpitations and leg swelling.  Neurological:  Negative for dizziness,  tingling, tremors, weakness and headaches.  Psychiatric/Behavioral:  Negative for depression, memory loss and suicidal ideas. The patient is nervous/anxious. The patient does not have insomnia.       Objective  Vitals:   07/31/23 0818  BP: 128/82  Pulse: 90  SpO2: 97%  Weight: 280 lb 9.6 oz (127.3 kg)  Height: 5\' 9"  (1.753 m)    Body mass index is 41.44 kg/m.  Physical Exam Vitals reviewed.  Constitutional:      General: He is awake.     Appearance: Normal appearance. He is well-developed and well-groomed.  HENT:     Head: Normocephalic and atraumatic.  Cardiovascular:     Rate and Rhythm: Normal rate and regular rhythm.     Pulses: Normal pulses.          Radial pulses are 2+ on the right side and 2+ on the left side.     Heart sounds: Normal heart sounds. No murmur heard.    No friction rub. No gallop.  Pulmonary:     Effort: Pulmonary effort is normal.     Breath sounds: Normal breath sounds. No decreased air movement. No decreased breath sounds, wheezing, rhonchi or rales.  Musculoskeletal:     Right lower leg: No edema.     Left lower leg: No edema.  Neurological:     Mental Status: He is alert.  Psychiatric:        Attention and Perception: Attention and perception normal.        Mood and Affect: Mood and affect normal.        Speech: Speech normal.        Behavior: Behavior normal. Behavior is cooperative.      Recent Results (from the past 2160 hour(s))  Comp Met (CMET)     Status: Abnormal   Collection Time: 05/02/23  3:21 PM  Result Value Ref Range   Glucose 128 (H) 70 - 99 mg/dL   BUN 13  6 - 24 mg/dL   Creatinine, Ser 4.09 0.76 - 1.27 mg/dL   eGFR 85 >81 XB/JYN/8.29   BUN/Creatinine Ratio 13 9 - 20   Sodium 139 134 - 144 mmol/L   Potassium 4.3 3.5 - 5.2 mmol/L   Chloride 102 96 - 106 mmol/L   CO2 20 20 - 29 mmol/L   Calcium 10.0 8.7 - 10.2 mg/dL   Total Protein 7.1 6.0 - 8.5 g/dL   Albumin 4.4 3.8 - 4.9 g/dL   Globulin, Total 2.7 1.5 - 4.5  g/dL   Albumin/Globulin Ratio 1.6 1.2 - 2.2   Bilirubin Total 0.3 0.0 - 1.2 mg/dL   Alkaline Phosphatase 107 44 - 121 IU/L   AST 20 0 - 40 IU/L   ALT 24 0 - 44 IU/L  HgB A1c     Status: Abnormal   Collection Time: 05/02/23  3:21 PM  Result Value Ref Range   Hgb A1c MFr Bld 7.8 (H) 4.8 - 5.6 %    Comment:          Prediabetes: 5.7 - 6.4          Diabetes: >6.4          Glycemic control for adults with diabetes: <7.0    Est. average glucose Bld gHb Est-mCnc 177 mg/dL     FAO1/3:    0/07/6577    8:21 AM 05/02/2023    3:04 PM 02/02/2023    3:18 PM 11/02/2022    3:28 PM 08/01/2022    3:10 PM  Depression screen PHQ 2/9  Decreased Interest 0 0 0 0 0  Down, Depressed, Hopeless 0 0 0 0 0  PHQ - 2 Score 0 0 0 0 0  Altered sleeping 0 0 0 0 0  Tired, decreased energy 1 2 1  0 1  Change in appetite 0 0 0 0 0  Feeling bad or failure about yourself  0 0 0 0 0  Trouble concentrating 0 0 0 0 0  Moving slowly or fidgety/restless 0 0 0 0 0  Suicidal thoughts 0 0 0 0 0  PHQ-9 Score 1 2 1  0 1  Difficult doing work/chores   Not difficult at all Not difficult at all Not difficult at all      Fall Risk:    07/31/2023    8:21 AM 05/02/2023    3:04 PM 02/02/2023    3:18 PM 11/02/2022    3:28 PM 08/01/2022    3:10 PM  Fall Risk   Falls in the past year? 0 0 0 0 0  Number falls in past yr: 0 0 0 0 0  Injury with Fall? 0 0 0 0 0  Risk for fall due to : No Fall Risks No Fall Risks No Fall Risks No Fall Risks No Fall Risks  Follow up Falls evaluation completed Falls evaluation completed Falls evaluation completed Falls evaluation completed Falls evaluation completed      Functional Status Survey:      Assessment & Plan  Problem List Items Addressed This Visit       Endocrine   Controlled diabetes mellitus type 2 with complications (HCC)    Chronic, historic condition Previous A1c was 7.8 and he is not currently on medications Recheck today for monitoring- results to dictate further  management He is on statin therapy, foot exam and eye exam are up to date at this time Follow up in 3 months or sooner if concerns arise  Relevant Medications   atorvastatin (LIPITOR) 20 MG tablet   Other Relevant Orders   Comp Met (CMET)   CBC w/Diff   HgB A1c     Other   Anxiety - Primary    Chronic, historic condition Appears well controlled with Klonopin 1 mg PO every day  PDMP reviewed, no evidence of worrisome controlled substance use patterns at this time  Reviewed controlled substance agreement parameters  Refills provided today Follow up in 3 months or sooner if concerns arise        Relevant Medications   clonazePAM (KLONOPIN) 1 MG tablet   clonazePAM (KLONOPIN) 1 MG tablet (Start on 08/31/2023)   clonazePAM (KLONOPIN) 1 MG tablet (Start on 10/01/2023)   Long-term current use of benzodiazepine   Hyperlipidemia    Chronic, historic condition Currently taking Atorvastatin 20 mg PO every day and appears to be tolerating well Recheck cholesterol today- results to dictate further management Refills provided  Follow up in 6 months or sooner if concerns arise       Relevant Medications   atorvastatin (LIPITOR) 20 MG tablet   Other Relevant Orders   Lipid Profile     No follow-ups on file.   I, Alexus Galka E Cora Stetson, PA-C, have reviewed all documentation for this visit. The documentation on 07/31/23 for the exam, diagnosis, procedures, and orders are all accurate and complete.   Jacquelin Hawking, MHS, PA-C [facility]

## 2023-07-31 NOTE — Assessment & Plan Note (Signed)
Chronic, historic condition Previous A1c was 7.8 and he is not currently on medications Recheck today for monitoring- results to dictate further management He is on statin therapy, foot exam and eye exam are up to date at this time Follow up in 3 months or sooner if concerns arise

## 2023-07-31 NOTE — Assessment & Plan Note (Signed)
Chronic, historic condition Currently taking Atorvastatin 20 mg PO every day and appears to be tolerating well Recheck cholesterol today- results to dictate further management Refills provided  Follow up in 6 months or sooner if concerns arise

## 2023-07-31 NOTE — Assessment & Plan Note (Signed)
Chronic, historic condition Appears well controlled with Klonopin 1 mg PO every day  PDMP reviewed, no evidence of worrisome controlled substance use patterns at this time  Reviewed controlled substance agreement parameters  Refills provided today Follow up in 3 months or sooner if concerns arise

## 2023-08-03 ENCOUNTER — Ambulatory Visit: Payer: Commercial Managed Care - HMO | Admitting: Physician Assistant

## 2023-08-04 NOTE — Progress Notes (Signed)
Your labs have returned Your triglycerides are still elevated- if you are not already, I recommend reducing your consumption of saturated fats and exercising to improve your overall cholesterol. We can recheck in 6 months but if this does not improve we may need to increase your statin dose. Your electrolytes, liver and kidney function appear normal/stable at this time Your hemoglobin is a bit low, indicating mild anemia. If you are feeling weak, lightheaded, dizzy or short of breath we can run further labs to work this up but it is not super low so we can also monitor it for now.  Your A1c was stable at 7.8 but this is above goal. I recommend that you start a diabetes medication to get this under 7.0 and prevent complications or worsening disease. Let us know if you would like to start a medication and I will send it in.

## 2023-08-14 ENCOUNTER — Other Ambulatory Visit: Payer: Self-pay | Admitting: Physician Assistant

## 2023-08-14 DIAGNOSIS — E118 Type 2 diabetes mellitus with unspecified complications: Secondary | ICD-10-CM

## 2023-08-14 MED ORDER — METFORMIN HCL 500 MG PO TABS: 500.0000 mg | ORAL_TABLET | Freq: Two times a day (BID) | ORAL | 1 refills | Status: DC

## 2023-08-14 NOTE — Progress Notes (Signed)
I have sent in Metformin 500 mg to be taken by mouth twice per day with a meal. If he is still having some weakness and fatigue I recommend he schedules a follow up so we can get more labs and discuss further.

## 2023-08-18 ENCOUNTER — Ambulatory Visit
Admission: RE | Admit: 2023-08-18 | Discharge: 2023-08-18 | Disposition: A | Discharge status: Home / Self Care | Payer: Commercial Managed Care - HMO | Attending: Gastroenterology | Admitting: Gastroenterology

## 2023-08-18 ENCOUNTER — Encounter
Admission: RE | Disposition: A | Discharge status: Home / Self Care | Payer: Self-pay | Source: Home / Self Care | Attending: Gastroenterology

## 2023-08-18 ENCOUNTER — Encounter: Payer: Self-pay | Admitting: Gastroenterology

## 2023-08-18 ENCOUNTER — Ambulatory Visit: Payer: Commercial Managed Care - HMO | Admitting: Certified Registered"

## 2023-08-18 DIAGNOSIS — D123 Benign neoplasm of transverse colon: Secondary | ICD-10-CM | POA: Diagnosis not present

## 2023-08-18 DIAGNOSIS — Z87891 Personal history of nicotine dependence: Secondary | ICD-10-CM | POA: Diagnosis not present

## 2023-08-18 DIAGNOSIS — Z1211 Encounter for screening for malignant neoplasm of colon: Secondary | ICD-10-CM | POA: Diagnosis present

## 2023-08-18 DIAGNOSIS — Z86010 Personal history of colonic polyps: Secondary | ICD-10-CM

## 2023-08-18 HISTORY — PX: COLONOSCOPY WITH PROPOFOL: SHX5780

## 2023-08-18 HISTORY — PX: POLYPECTOMY: SHX5525

## 2023-08-18 HISTORY — DX: Type 2 diabetes mellitus without complications: E11.9

## 2023-08-18 SURGERY — COLONOSCOPY WITH PROPOFOL
Anesthesia: General

## 2023-08-18 MED ORDER — PROPOFOL 500 MG/50ML IV EMUL
INTRAVENOUS | Status: DC | PRN
  Administered 2023-08-18: 135 ug/kg/min via INTRAVENOUS

## 2023-08-18 MED ORDER — PROPOFOL 10 MG/ML IV BOLUS
INTRAVENOUS | Status: DC | PRN
  Administered 2023-08-18: 20 mg via INTRAVENOUS
  Administered 2023-08-18 (×2): 30 mg via INTRAVENOUS

## 2023-08-18 MED ORDER — PROPOFOL 1000 MG/100ML IV EMUL
INTRAVENOUS | Status: AC
  Filled 2023-08-18: qty 100

## 2023-08-18 MED ORDER — SODIUM CHLORIDE 0.9 % IV SOLN: INTRAVENOUS | Status: DC

## 2023-08-18 NOTE — H&P (Signed)
Wyline Mood, MD [address], [place], [place], [address] [address], [address], [place], [place], [address] Phone: [phone]  Fax: [phone]  Primary Care Physician:  Larae Grooms, NP   Pre-Procedure History & Physical: HPI:  Johnny Rios is a 57 y.o. male is here for an colonoscopy.   Past Medical History:  Diagnosis Date   Anxiety    Arthritis    Basal cell carcinoma 05/16/2023   R sup pectoral tx with ED&C   Basal cell carcinoma 05/16/2023   L dorsum forearm tx with ED&C   Basal cell carcinoma 06/07/2023   left top of shoulder, excision   Basal cell carcinoma (BCC) 05/16/2023   sup sternum tx with ED&C   BCC (basal cell carcinoma of skin) 05/03/2023   left chest  treated with [facility] 05/03/23   Diabetes mellitus without complication (HCC)    GERD (gastroesophageal reflux disease)    Sleep apnea    Uses CPAP   Tachycardia     Past Surgical History:  Procedure Laterality Date   APPENDECTOMY     CHOLECYSTECTOMY N/A 01/19/2017   Procedure: LAPAROSCOPIC CHOLECYSTECTOMY WITH INTRAOPERATIVE CHOLANGIOGRAM;  Surgeon: Earline Mayotte, MD;  Location: [facility] ORS;  Service: General;  Laterality: N/A;   COLONOSCOPY WITH PROPOFOL N/A 09/22/2021   Procedure: COLONOSCOPY WITH PROPOFOL;  Surgeon: Pasty Spillers, MD;  Location: [facility] ENDOSCOPY;  Service: Endoscopy;  Laterality: N/A;   COLONOSCOPY WITH PROPOFOL N/A 02/15/2023   Procedure: COLONOSCOPY WITH PROPOFOL;  Surgeon: Wyline Mood, MD;  Location: [facility] ENDOSCOPY;  Service: Gastroenterology;  Laterality: N/A;   HERNIA REPAIR     LACERATION REPAIR Left 04/04/2019   Procedure: Repair Multiple Lacerations;  Surgeon: Betha Loa, MD;  Location: [facility] OR;  Service: Orthopedics;  Laterality: Left;   MINOR NAILBED REPAIR Left 04/04/2019   Procedure: Minor Nailbed Repair;  Surgeon: Betha Loa, MD;  Location: [facility] OR;  Service: Orthopedics;  Laterality: Left;   NERVE AND TENDON REPAIR Left 04/04/2019    Procedure: IRRIGATION AND DEBRIDEMENT OF FIXATION OF OPEN FRACTURES LONG ,RING FINGER;  Surgeon: Betha Loa, MD;  Location: [facility] OR;  Service: Orthopedics;  Laterality: Left;   TONSILLECTOMY     UMBILICAL HERNIA REPAIR N/A 06/12/2017   Ventral hernia with 6 cm Ventralex ST mesh.   VENTRAL HERNIA REPAIR N/A 07/11/2018   15 x 20 cm intraperitoneal Ventralex mesh  ;  Surgeon: Earline Mayotte, MD;  Location: [facility] ORS;  Service: General;  Laterality: N/A;    Prior to Admission medications   Medication Sig Start Date End Date Taking? Authorizing Provider  atorvastatin (LIPITOR) 20 MG tablet Take 1 tablet (20 mg total) by mouth daily. 07/31/23  Yes Mecum, Erin E, PA-C  clonazePAM (KLONOPIN) 1 MG tablet Take 1 tablet (1 mg total) by mouth daily as needed for anxiety. TAKE ONE TABLET DAILY AS NEEDED. 10/01/23 10/31/23 Yes Mecum, Erin E, PA-C  esomeprazole (NEXIUM) 20 MG capsule Take 1 capsule (20 mg total) by mouth every morning. 02/02/23  Yes Larae Grooms, NP  metFORMIN (GLUCOPHAGE) 500 MG tablet Take 1 tablet (500 mg total) by mouth 2 (two) times daily with a meal. 08/14/23  Yes Mecum, Erin E, PA-C  clonazePAM (KLONOPIN) 1 MG tablet Take 1 tablet (1 mg total) by mouth daily as needed for anxiety. TAKE ONE TABLET DAILY AS NEEDED. 07/31/23 08/30/23  Mecum, Erin E, PA-C  clonazePAM (KLONOPIN) 1 MG tablet Take 1 tablet (1 mg total) by mouth daily as  needed for anxiety. TAKE ONE TABLET DAILY AS NEEDED. 08/31/23 09/30/23  Mecum, Erin E, PA-C  fenofibrate 160 MG tablet Take 1 tablet (160 mg total) by mouth daily. Patient not taking: Reported on 05/02/2023 02/02/23   Larae Grooms, NP  meloxicam (MOBIC) 7.5 MG tablet Take 1 tablet every day by oral route for 30 days. Patient not taking: Reported on 05/03/2023    [provider]  mupirocin ointment (BACTROBAN) 2 % Apply 1 Application topically daily. Apply to wound at chest cover with non stick gauze until healed. Patient not taking: Reported on 07/31/2023  05/10/23   Deirdre Evener, MD  ondansetron [facility]) 4 MG tablet Take 1 tablet (4 mg total) by mouth every 8 (eight) hours as needed for nausea or vomiting. 07/31/23   Mecum, Erin E, PA-C  triamcinolone cream (KENALOG) 0.1 % Apply topically to affected red rash twice daily prn until clear. Avoid applying to face, groin, and axilla. Use as directed. Long-term use can cause thinning of the skin. Patient not taking: Reported on 07/31/2023 05/10/23   Deirdre Evener, MD    Allergies as of 07/04/2023   (No Known Allergies)    Family History  Problem Relation Age of Onset   Cancer Father        lung   Alzheimer's disease Mother    Heart attack Brother    Heart attack Brother     Social History   Socioeconomic History   Marital status: Single    Spouse name: Not on file   Number of children: 2   Years of education: HS   Highest education level: 12th grade  Occupational History   Not on file  Tobacco Use   Smoking status: Never   Smokeless tobacco: Former  Building services engineer status: Never Used  Substance and Sexual Activity   Alcohol use: Yes    Comment: occasional   Drug use: No   Sexual activity: Not Currently  Other Topics Concern   Not on file  Social History Narrative   Drinks 3-4 caffeine drinks a day    Social Determinants of Health   Financial Resource Strain: Low Risk  (04/30/2023)   Overall Financial Resource Strain (CARDIA)    Difficulty of Paying Living Expenses: Not very hard  Food Insecurity: No Food Insecurity (04/30/2023)   Hunger Vital Sign    Worried About Running Out of Food in the Last Year: Never true    Ran Out of Food in the Last Year: Never true  Transportation Needs: No Transportation Needs (04/30/2023)   PRAPARE - Administrator, Civil Service (Medical): No    Lack of Transportation (Non-Medical): No  Physical Activity: Unknown (04/30/2023)   Exercise Vital Sign    Days of Exercise per Week: 0 days    Minutes of Exercise per Session: Not  on file  Stress: No Stress Concern Present (04/30/2023)   [employer] of Occupational Health - Occupational Stress Questionnaire    Feeling of Stress : Not at all  Social Connections: Socially Isolated (04/30/2023)   Social Connection and Isolation Panel [NHANES]    Frequency of Communication with Friends and Family: More than three times a week    Frequency of Social Gatherings with Friends and Family: Patient declined    Attends Religious Services: Never    Database administrator or Organizations: No    Attends Engineer, structural: Not on file    Marital Status: Divorced  Catering manager  Violence: Not on file    Review of Systems: See HPI, otherwise negative ROS  Physical Exam: BP (!) 156/102   Pulse 78   Temp (!) 96.9 F (36.1 C) (Temporal)   Resp 20   Ht 5\' 9"  (1.753 m)   Wt 126.1 kg   SpO2 98%   BMI 41.05 kg/m  General:   Alert,  pleasant and cooperative in NAD Head:  Normocephalic and atraumatic. Neck:  Supple; no masses or thyromegaly. Lungs:  Clear throughout to auscultation, normal respiratory effort.    Heart:  +S1, +S2, Regular rate and rhythm, No edema. Abdomen:  Soft, nontender and nondistended. Normal bowel sounds, without guarding, and without rebound.   Neurologic:  Alert and  oriented x4;  grossly normal neurologically.  Impression/Plan: Johnny Rios is here for an colonoscopy to be performed for Screening colonoscopy average risk   Risks, benefits, limitations, and alternatives regarding  colonoscopy have been reviewed with the patient.  Questions have been answered.  All parties agreeable.   Wyline Mood, MD  08/18/2023, 8:14 AM

## 2023-08-18 NOTE — Op Note (Signed)
[facility] Gastroenterology Patient Name: Johnny Rios Procedure Date: 08/18/2023 8:14 AM MRN: [id number] Account #: [id number] Date of Birth: Jun 07, 1966 Admit Type: Outpatient Age: 57 Room: [facility] ENDO ROOM 4 Gender: Male Note Status: Finalized Instrument Name: Prentice Docker [id number] Procedure:             Colonoscopy Indications:           Screening for colorectal malignant neoplasm Providers:             Wyline Mood MD, MD Referring MD:          Larae Grooms (Referring MD) Medicines:             Monitored Anesthesia Care Complications:         No immediate complications. Procedure:             Pre-Anesthesia Assessment:                        - Prior to the procedure, a History and Physical was                         performed, and patient medications, allergies and                         sensitivities were reviewed. The patient's tolerance                         of previous anesthesia was reviewed.                        - The risks and benefits of the procedure and the                         sedation options and risks were discussed with the                         patient. All questions were answered and informed                         consent was obtained.                        - ASA Grade Assessment: II - A patient with mild                         systemic disease.                        After obtaining informed consent, the colonoscope was                         passed under direct vision. Throughout the procedure,                         the patient's blood pressure, pulse, and oxygen                         saturations were monitored continuously. The                         Colonoscope was introduced through  the anus and                         advanced to the the cecum, identified by the                         appendiceal orifice. The colonoscopy was performed                         with ease. The patient tolerated the procedure well.                          The quality of the bowel preparation was excellent.                         The ileocecal valve, appendiceal orifice, and rectum                         were photographed. Findings:      The perianal and digital rectal examinations were normal.      Two sessile polyps were found in the transverse colon. The polyps were 3       to 4 mm in size. These polyps were removed with a cold biopsy forceps.       Resection and retrieval were complete.      The exam was otherwise without abnormality on direct and retroflexion       views. Impression:            - Two 3 to 4 mm polyps in the transverse colon,                         removed with a cold biopsy forceps. Resected and                         retrieved.                        - The examination was otherwise normal on direct and                         retroflexion views. Recommendation:        - Discharge patient to home (with escort).                        - Resume previous diet.                        - Continue present medications.                        - Await pathology results.                        - Repeat colonoscopy for surveillance based on                         pathology results. Procedure Code(s):     --- Professional ---                        [id number], Colonoscopy, flexible; with biopsy, single  or                         multiple CPT copyright 2022 American Medical Association. All rights reserved. The codes documented in this report are preliminary and upon coder review may  be revised to meet current compliance requirements. Wyline Mood, MD Wyline Mood MD, MD 08/18/2023 8:41:22 AM This report has been signed electronically. Number of Addenda: 0 Note Initiated On: 08/18/2023 8:14 AM Scope Withdrawal Time: 0 hours 12 minutes 30 seconds  Total Procedure Duration: 0 hours 14 minutes 28 seconds  Estimated Blood Loss:  Estimated blood loss: none.      [facility]

## 2023-08-18 NOTE — Anesthesia Postprocedure Evaluation (Signed)
Anesthesia Post Note  Patient: Johnny Rios  Procedure(s) Performed: COLONOSCOPY WITH PROPOFOL POLYPECTOMY  Patient location during evaluation: Endoscopy Anesthesia Type: General Level of consciousness: awake and alert Pain management: pain level controlled Vital Signs Assessment: post-procedure vital signs reviewed and stable Respiratory status: spontaneous breathing, nonlabored ventilation, respiratory function stable and patient connected to nasal cannula oxygen Cardiovascular status: blood pressure returned to baseline and stable Postop Assessment: no apparent nausea or vomiting Anesthetic complications: no   No notable events documented.   Last Vitals:  Vitals:   08/18/23 0724 08/18/23 0841  BP: (!) 156/102 109/71  Pulse: 78 73  Resp: 20 15  Temp: (!) 36.1 C (!) 35.8 C  SpO2: 98% 100%    Last Pain:  Vitals:   08/18/23 0851  TempSrc:   PainSc: 0-No pain                 Louie Boston

## 2023-08-18 NOTE — Anesthesia Preprocedure Evaluation (Signed)
Anesthesia Evaluation  Patient identified by MRN, date of birth, ID band Patient awake    Reviewed: Allergy & Precautions, NPO status , Patient's Chart, lab work & pertinent test results  History of Anesthesia Complications Negative for: history of anesthetic complications  Airway Mallampati: III  TM Distance: >3 FB Neck ROM: full    Dental no notable dental hx.    Pulmonary sleep apnea    Pulmonary exam normal        Cardiovascular negative cardio ROS Normal cardiovascular exam     Neuro/Psych  PSYCHIATRIC DISORDERS Anxiety     negative neurological ROS     GI/Hepatic Neg liver ROS, PUD,GERD  Controlled,,  Endo/Other  diabetes, Well Controlled, Type 2, Oral Hypoglycemic Agents  Morbid obesity (BMI 41)  Renal/GU negative Renal ROS  negative genitourinary   Musculoskeletal  (+) Arthritis ,    Abdominal   Peds  Hematology negative hematology ROS (+)   Anesthesia Other Findings Past Medical History: No date: Anxiety No date: Arthritis 05/16/2023: Basal cell carcinoma     Comment:  R sup pectoral tx with ED&C 05/16/2023: Basal cell carcinoma     Comment:  L dorsum forearm tx with ED&C 06/07/2023: Basal cell carcinoma     Comment:  left top of shoulder, excision 05/16/2023: Basal cell carcinoma (BCC)     Comment:  sup sternum tx with ED&C 05/03/2023: BCC (basal cell carcinoma of skin)     Comment:  left chest  treated with [facility] 05/03/23 No date: GERD (gastroesophageal reflux disease) No date: Sleep apnea     Comment:  Uses CPAP No date: Tachycardia  Past Surgical History: No date: APPENDECTOMY 01/19/2017: CHOLECYSTECTOMY; N/A     Comment:  Procedure: LAPAROSCOPIC CHOLECYSTECTOMY WITH               INTRAOPERATIVE CHOLANGIOGRAM;  Surgeon: Earline Mayotte, MD;  Location: [facility] ORS;  Service: General;                Laterality: N/A; 09/22/2021: COLONOSCOPY WITH PROPOFOL; N/A     Comment:   Procedure: COLONOSCOPY WITH PROPOFOL;  Surgeon:               Pasty Spillers, MD;  Location: [facility] ENDOSCOPY;                Service: Endoscopy;  Laterality: N/A; 02/15/2023: COLONOSCOPY WITH PROPOFOL; N/A     Comment:  Procedure: COLONOSCOPY WITH PROPOFOL;  Surgeon: Wyline Mood, MD;  Location: [facility] ENDOSCOPY;  Service:               Gastroenterology;  Laterality: N/A; 04/04/2019: LACERATION REPAIR; Left     Comment:  Procedure: Repair Multiple Lacerations;  Surgeon: Betha Loa, MD;  Location: [facility] OR;  Service: Orthopedics;                Laterality: Left; 04/04/2019: MINOR NAILBED REPAIR; Left     Comment:  Procedure: Minor Nailbed Repair;  Surgeon: Betha Loa,              MD;  Location: [facility] OR;  Service: Orthopedics;  Laterality:              Left; 04/04/2019: NERVE AND TENDON REPAIR; Left     Comment:  Procedure: IRRIGATION AND DEBRIDEMENT OF FIXATION OF               OPEN FRACTURES LONG ,RING FINGER;  Surgeon: Betha Loa,              MD;  Location: [facility] OR;  Service: Orthopedics;  Laterality:              Left; No date: TONSILLECTOMY 06/12/2017: UMBILICAL HERNIA REPAIR; N/A     Comment:  Ventral hernia with 6 cm Ventralex ST mesh. 07/11/2018: VENTRAL HERNIA REPAIR; N/A     Comment:  15 x 20 cm intraperitoneal Ventralex mesh  ;  Surgeon:               Earline Mayotte, MD;  Location: [facility] ORS;  Service:               General;  Laterality: N/A;     Reproductive/Obstetrics negative OB ROS                              Anesthesia Physical Anesthesia Plan  ASA: 3  Anesthesia Plan: General   Post-op Pain Management: Minimal or no pain anticipated   Induction: Intravenous  PONV Risk Score and Plan: 1 and Propofol infusion and TIVA  Airway Management Planned: Natural Airway and Nasal Cannula  Additional Equipment:   Intra-op Plan:   Post-operative Plan:   Informed Consent: I have reviewed the patients History and  Physical, chart, labs and discussed the procedure including the risks, benefits and alternatives for the proposed anesthesia with the patient or authorized representative who has indicated his/her understanding and acceptance.     Dental Advisory Given  Plan Discussed with: Anesthesiologist, CRNA and Surgeon  Anesthesia Plan Comments: (Patient consented for risks of anesthesia including but not limited to:  - adverse reactions to medications - risk of airway placement if required - damage to eyes, teeth, lips or other oral mucosa - nerve damage due to positioning  - sore throat or hoarseness - Damage to heart, brain, nerves, lungs, other parts of body or loss of life  Patient voiced understanding.)         Anesthesia Quick Evaluation

## 2023-08-18 NOTE — Transfer of Care (Signed)
Immediate Anesthesia Transfer of Care Note  Patient: Johnny Rios  Procedure(s) Performed: COLONOSCOPY WITH PROPOFOL POLYPECTOMY  Patient Location: PACU  Anesthesia Type:General  Level of Consciousness: drowsy  Airway & Oxygen Therapy: Patient Spontanous Breathing and Patient connected to face mask oxygen  Post-op Assessment: Report given to RN, Post -op Vital signs reviewed and stable, and Patient moving all extremities X 4  Post vital signs: Reviewed and stable  Last Vitals:  Vitals Value Taken Time  BP 109/71 08/18/23 0842  Temp 35.8 C 08/18/23 0841  Pulse 72 08/18/23 0842  Resp 17 08/18/23 0842  SpO2 100 % 08/18/23 0842  Vitals shown include unfiled device data.  Last Pain:  Vitals:   08/18/23 0841  TempSrc: Temporal  PainSc: Asleep         Complications: No notable events documented.

## 2023-08-18 NOTE — Anesthesia Procedure Notes (Signed)
Procedure Name: MAC Date/Time: 08/18/2023 8:22 AM  Performed by: Nelle Don, CRNAPre-anesthesia Checklist: Patient identified, Emergency Drugs available, Suction available and Patient being monitored Oxygen Delivery Method: Simple face mask

## 2023-08-21 ENCOUNTER — Encounter: Payer: Self-pay | Admitting: Gastroenterology

## 2023-08-21 ENCOUNTER — Ambulatory Visit: Payer: Commercial Managed Care - HMO | Admitting: Physician Assistant

## 2023-08-21 VITALS — BP 115/81 | HR 108 | Temp 97.3°F | Wt 277.6 lb

## 2023-08-21 DIAGNOSIS — R5383 Other fatigue: Secondary | ICD-10-CM | POA: Diagnosis not present

## 2023-08-21 DIAGNOSIS — E611 Iron deficiency: Secondary | ICD-10-CM

## 2023-08-21 DIAGNOSIS — E559 Vitamin D deficiency, unspecified: Secondary | ICD-10-CM

## 2023-08-21 NOTE — Progress Notes (Unsigned)
Acute Office Visit   Patient: Johnny Rios   DOB: May 01, 1966   57 y.o. Male  MRN: [id number] Visit Date: 08/21/2023  Today's healthcare provider: Oswaldo Conroy Lanette Ell, PA-C  Introduced myself to the patient as a Secondary school teacher and provided education on APPs in clinical practice.    Chief Complaint  Patient presents with   Fatigue   Subjective    HPI     FATIGUE Duration:  chronic Severity: 10/10  Onset: gradual Context when symptoms started:  unknown Symptoms improve with rest: yes - uses his CPAP as directed  Depressive symptoms: no Stress/anxiety: no Insomnia: no    Snoring: no Observed apnea by bed partner:  has CPAP and is using consistently   Daytime hypersomnolence:yes Wakes feeling refreshed: yes History of sleep study: yes Dysnea on exertion:  no Orthopnea/PND: no Chest pain: no Chronic cough: no Lower extremity edema: no Arthralgias:yes Myalgias: yes Weakness: no Rash: no  Most recent CBC demonstrated mild anemia  He just had a colonoscopy - no unusual findings    Medications: Outpatient Medications Prior to Visit  Medication Sig   atorvastatin (LIPITOR) 20 MG tablet Take 1 tablet (20 mg total) by mouth daily.   clonazePAM (KLONOPIN) 1 MG tablet Take 1 tablet (1 mg total) by mouth daily as needed for anxiety. TAKE ONE TABLET DAILY AS NEEDED.   [START ON 08/31/2023] clonazePAM (KLONOPIN) 1 MG tablet Take 1 tablet (1 mg total) by mouth daily as needed for anxiety. TAKE ONE TABLET DAILY AS NEEDED.   [START ON 10/01/2023] clonazePAM (KLONOPIN) 1 MG tablet Take 1 tablet (1 mg total) by mouth daily as needed for anxiety. TAKE ONE TABLET DAILY AS NEEDED.   esomeprazole (NEXIUM) 20 MG capsule Take 1 capsule (20 mg total) by mouth every morning.   fenofibrate 160 MG tablet Take 1 tablet (160 mg total) by mouth daily. (Patient not taking: Reported on 05/02/2023)   meloxicam (MOBIC) 7.5 MG tablet Take 1 tablet every day by oral route for 30 days. (Patient not taking:  Reported on 05/03/2023)   metFORMIN (GLUCOPHAGE) 500 MG tablet Take 1 tablet (500 mg total) by mouth 2 (two) times daily with a meal.   mupirocin ointment (BACTROBAN) 2 % Apply 1 Application topically daily. Apply to wound at chest cover with non stick gauze until healed. (Patient not taking: Reported on 07/31/2023)   ondansetron (ZOFRAN) 4 MG tablet Take 1 tablet (4 mg total) by mouth every 8 (eight) hours as needed for nausea or vomiting.   triamcinolone cream (KENALOG) 0.1 % Apply topically to affected red rash twice daily prn until clear. Avoid applying to face, groin, and axilla. Use as directed. Long-term use can cause thinning of the skin. (Patient not taking: Reported on 07/31/2023)   No facility-administered medications prior to visit.    Review of Systems  Eyes:  Negative for visual disturbance.  Respiratory:  Negative for shortness of breath and wheezing.   Cardiovascular:  Negative for chest pain, palpitations and leg swelling.  Gastrointestinal:  Positive for abdominal pain (he has current hernia and previous hx of others). Negative for blood in stool, constipation, diarrhea, nausea and vomiting.  Musculoskeletal:  Positive for arthralgias and myalgias.  Skin:  Negative for color change and rash.  Neurological:  Positive for light-headedness (intermittent- short lived). Negative for dizziness, seizures, weakness, numbness and headaches.  Hematological:  Negative for adenopathy. Does not bruise/bleed easily.  Psychiatric/Behavioral:  Negative for dysphoric mood and  suicidal ideas. The patient is not nervous/anxious.     {Insert previous labs (optional):23779} {See past labs  Heme  Chem  Endocrine  Serology  Results Review (optional):1}   Objective    BP 115/81   Pulse (!) 108   Temp (!) 97.3 F (36.3 C)   Wt 277 lb 9.6 oz (125.9 kg)   SpO2 96%   BMI 40.99 kg/m  {Insert last BP/Wt (optional):23777}{See vitals history (optional):1}   Physical Exam Vitals reviewed.   Constitutional:      General: He is awake.     Appearance: Normal appearance. He is well-developed and well-groomed.  HENT:     Head: Normocephalic and atraumatic.  Cardiovascular:     Rate and Rhythm: Normal rate and regular rhythm.     Pulses: Normal pulses.          Radial pulses are 2+ on the right side and 2+ on the left side.     Heart sounds: Normal heart sounds.  Pulmonary:     Effort: Pulmonary effort is normal.     Breath sounds: Normal breath sounds. No decreased air movement. No decreased breath sounds, wheezing, rhonchi or rales.  Musculoskeletal:     Right lower leg: No edema.     Left lower leg: No edema.  Neurological:     General: No focal deficit present.     Mental Status: He is alert and oriented to person, place, and time.     GCS: GCS eye subscore is 4. GCS verbal subscore is 5. GCS motor subscore is 6.     Cranial Nerves: No dysarthria or facial asymmetry.  Psychiatric:        Attention and Perception: Attention and perception normal.        Mood and Affect: Mood and affect normal.        Speech: Speech normal.        Behavior: Behavior normal. Behavior is cooperative.       No results found for any visits on 08/21/23.  Assessment & Plan      No follow-ups on file.      Problem List Items Addressed This Visit   None Visit Diagnoses     Fatigue, unspecified type    -  Primary   Relevant Orders   CBC w/Diff   Iron, TIBC and Ferritin Panel   Comp Met (CMET)   B12   Vitamin B1   Vitamin D (25 hydroxy)        No follow-ups on file.   I, Johnny Moskowitz E Aryn Safran, PA-C, have reviewed all documentation for this visit. The documentation on 08/21/23 for the exam, diagnosis, procedures, and orders are all accurate and complete.   Johnny Rios, MHS, PA-C [facility]

## 2023-08-21 NOTE — Patient Instructions (Signed)
At this time I recommend

## 2023-08-23 MED ORDER — IRON (FERROUS SULFATE) 325 (65 FE) MG PO TABS
325.0000 mg | ORAL_TABLET | Freq: Every day | ORAL | 1 refills | Status: DC
Start: 2023-08-23 — End: 2023-09-18

## 2023-08-23 MED ORDER — VITAMIN D (ERGOCALCIFEROL) 1.25 MG (50000 UNIT) PO CAPS
50000.0000 [IU] | ORAL_CAPSULE | ORAL | 0 refills | Status: DC
Start: 2023-08-23 — End: 2023-09-18

## 2023-08-23 NOTE — Progress Notes (Signed)
Your CBC has returned to normal but it looks like you may have an iron deficiency - I recommend that you start an iron supplement at this time. You can take an over -the-counter supplement and we will recheck in about a month Your electrolytes, liver and kidney function were in normal ranges Your vitamin D was very low- I have sent in a supplement for you to take once per week to correct this. We can recheck this in about 3 months Your B12 was in normal range We are still waiting on the thiamine test results and will keep you updated once they are available

## 2023-08-28 LAB — CBC WITH DIFFERENTIAL/PLATELET
Basophils Absolute: 0.1 10*3/uL (ref 0.0–0.2)
Basos: 1 %
EOS (ABSOLUTE): 0.3 10*3/uL (ref 0.0–0.4)
Eos: 3 %
Hematocrit: 39.7 % (ref 37.5–51.0)
Hemoglobin: 13 g/dL (ref 13.0–17.7)
Immature Grans (Abs): 0 10*3/uL (ref 0.0–0.1)
Immature Granulocytes: 0 %
Lymphocytes Absolute: 3 10*3/uL (ref 0.7–3.1)
Lymphs: 35 %
MCH: 25.5 pg — ABNORMAL LOW (ref 26.6–33.0)
MCHC: 32.7 g/dL (ref 31.5–35.7)
MCV: 78 fL — ABNORMAL LOW (ref 79–97)
Monocytes Absolute: 0.6 10*3/uL (ref 0.1–0.9)
Monocytes: 6 %
Neutrophils Absolute: 4.8 10*3/uL (ref 1.4–7.0)
Neutrophils: 55 %
Platelets: 340 10*3/uL (ref 150–450)
RBC: 5.09 x10E6/uL (ref 4.14–5.80)
RDW: 13.1 % (ref 11.6–15.4)
WBC: 8.7 10*3/uL (ref 3.4–10.8)

## 2023-08-28 LAB — VITAMIN B12: Vitamin B-12: 436 pg/mL (ref 232–1245)

## 2023-08-28 LAB — COMPREHENSIVE METABOLIC PANEL
ALT: 32 IU/L (ref 0–44)
AST: 21 IU/L (ref 0–40)
Albumin: 4.4 g/dL (ref 3.8–4.9)
Alkaline Phosphatase: 114 IU/L (ref 44–121)
BUN/Creatinine Ratio: 12 (ref 9–20)
BUN: 12 mg/dL (ref 6–24)
Bilirubin Total: 0.3 mg/dL (ref 0.0–1.2)
CO2: 23 mmol/L (ref 20–29)
Calcium: 10 mg/dL (ref 8.7–10.2)
Chloride: 105 mmol/L (ref 96–106)
Creatinine, Ser: 1.03 mg/dL (ref 0.76–1.27)
Globulin, Total: 2.6 g/dL (ref 1.5–4.5)
Glucose: 128 mg/dL — ABNORMAL HIGH (ref 70–99)
Potassium: 4.3 mmol/L (ref 3.5–5.2)
Sodium: 141 mmol/L (ref 134–144)
Total Protein: 7 g/dL (ref 6.0–8.5)
eGFR: 86 mL/min/{1.73_m2} (ref >59)

## 2023-08-28 LAB — IRON,TIBC AND FERRITIN PANEL
Ferritin: 63 ng/mL (ref 30–400)
Iron Saturation: 8 % — CL (ref 15–55)
Iron: 30 ug/dL — ABNORMAL LOW (ref 38–169)
Total Iron Binding Capacity: 391 ug/dL (ref 250–450)
UIBC: 361 ug/dL — ABNORMAL HIGH (ref 111–343)

## 2023-08-28 LAB — VITAMIN D 25 HYDROXY (VIT D DEFICIENCY, FRACTURES): Vit D, 25-Hydroxy: 20.7 ng/mL — ABNORMAL LOW (ref 30.0–100.0)

## 2023-08-28 LAB — VITAMIN B1: Thiamine: 113.1 nmol/L (ref 66.5–200.0)

## 2023-08-29 ENCOUNTER — Other Ambulatory Visit: Payer: Self-pay | Admitting: Nurse Practitioner

## 2023-08-29 DIAGNOSIS — F419 Anxiety disorder, unspecified: Secondary | ICD-10-CM

## 2023-08-29 NOTE — Telephone Encounter (Signed)
Medication Refill - Medication: clonazePAM (KLONOPIN) 1 MG tablet   Has the patient contacted their pharmacy? No. Pt is wanting to make sure all of his medications was transferred to [facility]. Pt states that Denny Peon Mecum was suppose to transfer all of his medications two weeks ago.   Preferred Pharmacy (with phone number or street name): [facility] - [place], [place] - [address] Phone: [phone] Fax: [phone]  Has the patient been seen for an appointment in the last year OR does the patient have an upcoming appointment? Yes.    Agent: Please be advised that RX refills may take up to 3 business days. We ask that you follow-up with your pharmacy.

## 2023-08-30 ENCOUNTER — Telehealth: Payer: Self-pay | Admitting: Nurse Practitioner

## 2023-08-30 NOTE — Telephone Encounter (Signed)
Requested medication (s) are due for refill today: routing for review  Requested medication (s) are on the active medication list: yes  Last refill:  07/31/23  Future visit scheduled: yes  Notes to clinic:  Unable to refill per protocol, cannot delegate. Resend to [facility] pharmacy      Requested Prescriptions  Pending Prescriptions Disp Refills   clonazePAM (KLONOPIN) 1 MG tablet 30 tablet 0    Sig: Take 1 tablet (1 mg total) by mouth daily as needed for anxiety. TAKE ONE TABLET DAILY AS NEEDED.     Not Delegated - Psychiatry: Anxiolytics/Hypnotics 2 Failed - 08/29/2023  8:35 AM      Failed - This refill cannot be delegated      Failed - Urine Drug Screen completed in last 360 days      Passed - Patient is not pregnant      Passed - Valid encounter within last 6 months    Recent Outpatient Visits           1 week ago Fatigue, unspecified type   [place] [facility] Mecum, Oswaldo Conroy, PA-C   1 month ago Anxiety   [place] [facility] Mecum, Oswaldo Conroy, PA-C   4 months ago Anxiety   [place] [facility] Larae Grooms, NP   6 months ago Annual physical exam   [place] [facility] Larae Grooms, NP   10 months ago Diabetes mellitus without complication [facility])   [place] [facility] Larae Grooms, NP       Future Appointments             In 2 months Larae Grooms, NP [place] [facility]   In 2 months Deirdre Evener, MD [facility]

## 2023-08-30 NOTE — Telephone Encounter (Signed)
Pt is calling in requesting Erin Mecum give him a call back as soon as possible regarding his prescription for clonazePAM (KLONOPIN) 1 MG tablet [[id number]] being sent to [facility]. Pt says he has been trying to get this handled since last week and he is frustrated because he is almost out of medication and cannot be sure if the medication is going to be sent to the right.

## 2023-08-31 NOTE — Telephone Encounter (Signed)
Left a message for patient to inform him that his Rx will be corrected and transferred to [facility] per his request. Informed patient to give it to the end of the day. Advised patient if he has any questions or concerns, please do not hesitate to give our office a call back.

## 2023-08-31 NOTE — Telephone Encounter (Signed)
PLease refuse. RX is already at the pharmacy with a fill date of today.

## 2023-09-15 ENCOUNTER — Other Ambulatory Visit: Payer: Self-pay | Admitting: Nurse Practitioner

## 2023-09-15 DIAGNOSIS — E782 Mixed hyperlipidemia: Secondary | ICD-10-CM

## 2023-09-15 DIAGNOSIS — E785 Hyperlipidemia, unspecified: Secondary | ICD-10-CM

## 2023-09-15 DIAGNOSIS — K279 Peptic ulcer, site unspecified, unspecified as acute or chronic, without hemorrhage or perforation: Secondary | ICD-10-CM

## 2023-09-15 NOTE — Telephone Encounter (Signed)
Medication Refill - Medication: fenofibrate 160 MG tablet atorvastatin (LIPITOR) 20 MG tablet  esomeprazole (NEXIUM) 20 MG capsule   Has the patient contacted their pharmacy? No.  Preferred Pharmacy (with phone number or street name): [facility] - [place], [place] - [address], [place] [address] Phone: [phone]  Fax: [phone]  Has the patient been seen for an appointment in the last year OR does the patient have an upcoming appointment? Yes.    Agent: Please be advised that RX refills may take up to 3 business days. We ask that you follow-up with your pharmacy.

## 2023-09-18 ENCOUNTER — Other Ambulatory Visit: Payer: Self-pay | Admitting: Nurse Practitioner

## 2023-09-18 DIAGNOSIS — E118 Type 2 diabetes mellitus with unspecified complications: Secondary | ICD-10-CM

## 2023-09-18 DIAGNOSIS — E785 Hyperlipidemia, unspecified: Secondary | ICD-10-CM

## 2023-09-18 DIAGNOSIS — E611 Iron deficiency: Secondary | ICD-10-CM

## 2023-09-18 DIAGNOSIS — E559 Vitamin D deficiency, unspecified: Secondary | ICD-10-CM

## 2023-09-18 DIAGNOSIS — F419 Anxiety disorder, unspecified: Secondary | ICD-10-CM

## 2023-09-18 DIAGNOSIS — K279 Peptic ulcer, site unspecified, unspecified as acute or chronic, without hemorrhage or perforation: Secondary | ICD-10-CM

## 2023-09-18 MED ORDER — CLONAZEPAM 1 MG PO TABS
1.0000 mg | ORAL_TABLET | Freq: Every day | ORAL | 0 refills | Status: DC | PRN
Start: 2023-10-19 — End: 2023-10-31

## 2023-09-18 MED ORDER — METFORMIN HCL 500 MG PO TABS
500.0000 mg | ORAL_TABLET | Freq: Two times a day (BID) | ORAL | 1 refills | Status: DC
Start: 2023-09-18 — End: 2023-11-01

## 2023-09-18 MED ORDER — CLONAZEPAM 1 MG PO TABS
1.0000 mg | ORAL_TABLET | Freq: Every day | ORAL | 0 refills | Status: DC | PRN
Start: 2023-09-18 — End: 2023-10-31

## 2023-09-18 MED ORDER — IRON (FERROUS SULFATE) 325 (65 FE) MG PO TABS
325.0000 mg | ORAL_TABLET | Freq: Every day | ORAL | 1 refills | Status: DC
Start: 2023-09-18 — End: 2024-05-01

## 2023-09-18 MED ORDER — VITAMIN D (ERGOCALCIFEROL) 1.25 MG (50000 UNIT) PO CAPS
50000.0000 [IU] | ORAL_CAPSULE | ORAL | 0 refills | Status: DC
Start: 2023-09-18 — End: 2023-11-01

## 2023-09-18 MED ORDER — ATORVASTATIN CALCIUM 20 MG PO TABS
20.0000 mg | ORAL_TABLET | Freq: Every day | ORAL | 1 refills | Status: DC
Start: 2023-09-18 — End: 2024-05-01

## 2023-09-18 MED ORDER — ESOMEPRAZOLE MAGNESIUM 20 MG PO CPDR
20.0000 mg | DELAYED_RELEASE_CAPSULE | Freq: Every morning | ORAL | 2 refills | Status: AC
Start: 2023-09-18 — End: ?

## 2023-09-18 MED ORDER — FENOFIBRATE 160 MG PO TABS
160.0000 mg | ORAL_TABLET | Freq: Every day | ORAL | 0 refills | Status: DC
Start: 2023-09-18 — End: 2023-12-13

## 2023-09-18 MED ORDER — ONDANSETRON HCL 4 MG PO TABS: 4.0000 mg | ORAL_TABLET | Freq: Three times a day (TID) | ORAL | 0 refills | Status: DC | PRN

## 2023-09-18 NOTE — Telephone Encounter (Signed)
Pt called this morning upset saying he has been waiting on his prescriptions at [facility].  He said this has been going on for a month.  CB@  [phone]

## 2023-09-18 NOTE — Telephone Encounter (Signed)
Requested Prescriptions  Pending Prescriptions Disp Refills   fenofibrate 160 MG tablet 90 tablet 0    Sig: Take 1 tablet (160 mg total) by mouth daily.     Cardiovascular:  Antilipid - Fibric Acid Derivatives Failed - 09/15/2023 12:19 PM      Failed - Lipid Panel in normal range within the last 12 months    Cholesterol, Total  Date Value Ref Range Status  07/31/2023 155 100 - 199 mg/dL Final   LDL Chol Calc (NIH)  Date Value Ref Range Status  07/31/2023 85 0 - 99 mg/dL Final   HDL  Date Value Ref Range Status  07/31/2023 29 (L) >39 mg/dL Final   Triglycerides  Date Value Ref Range Status  07/31/2023 245 (H) 0 - 149 mg/dL Final         Passed - ALT in normal range and within 360 days    ALT  Date Value Ref Range Status  08/21/2023 32 0 - 44 IU/L Final         Passed - AST in normal range and within 360 days    AST  Date Value Ref Range Status  08/21/2023 21 0 - 40 IU/L Final         Passed - Cr in normal range and within 360 days    Creatinine  Date Value Ref Range Status  08/01/2022 178.7 20.0 - 300.0 mg/dL Final   Creatinine, Ser  Date Value Ref Range Status  08/21/2023 1.03 0.76 - 1.27 mg/dL Final         Passed - HGB in normal range and within 360 days    Hemoglobin  Date Value Ref Range Status  08/21/2023 13.0 13.0 - 17.7 g/dL Final         Passed - HCT in normal range and within 360 days    Hematocrit  Date Value Ref Range Status  08/21/2023 39.7 37.5 - 51.0 % Final         Passed - PLT in normal range and within 360 days    Platelets  Date Value Ref Range Status  08/21/2023 340 150 - 450 x10E3/uL Final         Passed - WBC in normal range and within 360 days    WBC  Date Value Ref Range Status  08/21/2023 8.7 3.4 - 10.8 x10E3/uL Final  06/16/2021 7.1 4.0 - 10.5 K/uL Final         Passed - eGFR is 30 or above and within 360 days    GFR calc Af Amer  Date Value Ref Range Status  10/01/2020 106 >59 mL/min/1.73 Final    Comment:     **[facility] currently reports eGFR in compliance with the current**   recommendations of the [employer]. [facility] will   update reporting as new guidelines are published from the NKF-ASN   Task force.    GFR, Estimated  Date Value Ref Range Status  06/16/2021 >60 >60 mL/min Final    Comment:    (NOTE) Calculated using the CKD-EPI Creatinine Equation (2021)    eGFR  Date Value Ref Range Status  08/21/2023 86 >59 mL/min/1.73 Final         Passed - Valid encounter within last 12 months    Recent Outpatient Visits           4 weeks ago Fatigue, unspecified type   [place] [facility] Mecum, Erin E, PA-C   1 month ago Anxiety   [facility] Mecum, Oswaldo Conroy, PA-C   4 months ago Anxiety   [place] [facility] Larae Grooms, NP   7 months ago Annual physical exam   [place] [facility] Larae Grooms, NP   10 months ago Diabetes mellitus without complication [facility])   [place] [facility] Larae Grooms, NP       Future Appointments             In 1 month Larae Grooms, NP [place] [facility]   In 1 month Deirdre Evener, MD [facility]

## 2023-09-18 NOTE — Telephone Encounter (Signed)
Routing to provider. Last telephone documentation states that medications were transferred. Can we send in new prescriptions to [facility] for the patient?

## 2023-10-30 NOTE — Progress Notes (Unsigned)
There were no vitals taken for this visit.   Subjective:    [name], male    DOB: 02/01/1966, 57 y.o.   MRN: [id number]  HPI: Johnny Rios is a 57 y.o. male  No chief complaint on file.  HYPERTENSION / HYPERLIPIDEMIA Satisfied with current treatment? yes Duration of hypertension: years BP monitoring frequency:not checking BP range:  BP medication side effects: no Past BP meds:  Duration of hyperlipidemia: years Cholesterol medication side effects: no Cholesterol supplements: none Past cholesterol medications:atorvastatin and fenofibrate Medication compliance: excellent  Aspirin: no Recent stressors: no Recurrent headaches: no Visual changes: no Palpitations: no Dyspnea: no Chest pain: no Lower extremity edema: no Dizzy/lightheaded: no  DIABETES Eye exam and foot exam are up to date.  Well controlled without medication.  Patient states he gets nausea once in awhile. Used to take Zofran and would like an updated prescription.  Hypoglycemic episodes:no Polydipsia/polyuria: no Visual disturbance: no Chest pain: no Paresthesias: no Glucose Monitoring: no  Accucheck frequency: not checking.   Fasting glucose:  Post prandial:  Evening:  Before meals: Taking Insulin?: no  Long acting insulin:  Short acting insulin: Blood Pressure Monitoring: not checking Retinal Examination: Not up to Date Foot Exam: up to date Diabetic Education: Not Completed Pneumovax: up to date Influenza: up to date Aspirin: no  ANXIETY Doing well with Klonopin.  Takes it daily.  Denies concerns at visit today.    Relevant past medical, surgical, family and social history reviewed and updated as indicated. Interim medical history since our last visit reviewed. Allergies and medications reviewed and updated.  Review of Systems  Eyes:  Negative for visual disturbance.  Respiratory:  Negative for chest tightness and shortness of breath.   Cardiovascular:  Negative  for chest pain, palpitations and leg swelling.  Gastrointestinal:  Positive for nausea.  Endocrine: Negative for polydipsia and polyuria.  Neurological:  Negative for dizziness, light-headedness, numbness and headaches.  Psychiatric/Behavioral:  The patient is nervous/anxious.     Per HPI unless specifically indicated above     Objective:    There were no vitals taken for this visit.  Wt Readings from Last 3 Encounters:  08/21/23 277 lb 9.6 oz (125.9 kg)  08/18/23 277 lb 15.7 oz (126.1 kg)  07/31/23 280 lb 9.6 oz (127.3 kg)    Physical Exam Vitals and nursing note reviewed.  Constitutional:      General: He is not in acute distress.    Appearance: Normal appearance. He is obese. He is not ill-appearing, toxic-appearing or diaphoretic.  HENT:     Head: Normocephalic.     Right Ear: External ear normal.     Left Ear: External ear normal.     Nose: Nose normal. No congestion or rhinorrhea.     Mouth/Throat:     Mouth: Mucous membranes are moist.  Eyes:     General:        Right eye: No discharge.        Left eye: No discharge.     Extraocular Movements: Extraocular movements intact.     Conjunctiva/sclera: Conjunctivae normal.     Pupils: Pupils are equal, round, and reactive to light.  Cardiovascular:     Rate and Rhythm: Normal rate and regular rhythm.     Heart sounds: No murmur heard. Pulmonary:     Effort: Pulmonary effort is normal. No respiratory distress.     Breath sounds: Normal breath sounds. No wheezing, rhonchi or rales.  Abdominal:  General: Abdomen is flat. Bowel sounds are normal.  Musculoskeletal:     Cervical back: Normal range of motion and neck supple.  Skin:    General: Skin is warm and dry.     Capillary Refill: Capillary refill takes less than 2 seconds.  Neurological:     General: No focal deficit present.     Mental Status: He is alert and oriented to person, place, and time.  Psychiatric:        Mood and Affect: Mood normal.         Behavior: Behavior normal.        Thought Content: Thought content normal.        Judgment: Judgment normal.    Results for orders placed or performed in visit on 08/21/23  CBC w/Diff  Result Value Ref Range   WBC 8.7 3.4 - 10.8 x10E3/uL   RBC 5.09 4.14 - 5.80 x10E6/uL   Hemoglobin 13.0 13.0 - 17.7 g/dL   Hematocrit 65.7 84.6 - 51.0 %   MCV 78 (L) 79 - 97 fL   MCH 25.5 (L) 26.6 - 33.0 pg   MCHC 32.7 31.5 - 35.7 g/dL   RDW 96.2 95.2 - 84.1 %   Platelets 340 150 - 450 x10E3/uL   Neutrophils 55 Not Estab. %   Lymphs 35 Not Estab. %   Monocytes 6 Not Estab. %   Eos 3 Not Estab. %   Basos 1 Not Estab. %   Neutrophils Absolute 4.8 1.4 - 7.0 x10E3/uL   Lymphocytes Absolute 3.0 0.7 - 3.1 x10E3/uL   Monocytes Absolute 0.6 0.1 - 0.9 x10E3/uL   EOS (ABSOLUTE) 0.3 0.0 - 0.4 x10E3/uL   Basophils Absolute 0.1 0.0 - 0.2 x10E3/uL   Immature Granulocytes 0 Not Estab. %   Immature Grans (Abs) 0.0 0.0 - 0.1 x10E3/uL  Iron, TIBC and Ferritin Panel  Result Value Ref Range   Total Iron Binding Capacity 391 250 - 450 ug/dL   UIBC 324 (H) 401 - 027 ug/dL   Iron 30 (L) 38 - 253 ug/dL   Iron Saturation 8 (LL) 15 - 55 %   Ferritin 63 30 - 400 ng/mL  Comp Met (CMET)  Result Value Ref Range   Glucose 128 (H) 70 - 99 mg/dL   BUN 12 6 - 24 mg/dL   Creatinine, Ser 6.64 0.76 - 1.27 mg/dL   eGFR 86 >40 HK/VQQ/5.95   BUN/Creatinine Ratio 12 9 - 20   Sodium 141 134 - 144 mmol/L   Potassium 4.3 3.5 - 5.2 mmol/L   Chloride 105 96 - 106 mmol/L   CO2 23 20 - 29 mmol/L   Calcium 10.0 8.7 - 10.2 mg/dL   Total Protein 7.0 6.0 - 8.5 g/dL   Albumin 4.4 3.8 - 4.9 g/dL   Globulin, Total 2.6 1.5 - 4.5 g/dL   Bilirubin Total 0.3 0.0 - 1.2 mg/dL   Alkaline Phosphatase 114 44 - 121 IU/L   AST 21 0 - 40 IU/L   ALT 32 0 - 44 IU/L  B12  Result Value Ref Range   Vitamin B-12 436 232 - 1,245 pg/mL  Vitamin B1  Result Value Ref Range   Thiamine 113.1 66.5 - 200.0 nmol/L  Vitamin D (25 hydroxy)  Result Value Ref  Range   Vit D, 25-Hydroxy 20.7 (L) 30.0 - 100.0 ng/mL      Assessment & Plan:   Problem List Items Addressed This Visit       Endocrine  Controlled diabetes mellitus type 2 with complications (HCC)     Other   Anxiety - Primary   Chronic fatigue   Hyperlipidemia     Follow up plan: No follow-ups on file.

## 2023-10-31 ENCOUNTER — Encounter: Payer: Self-pay | Admitting: Nurse Practitioner

## 2023-10-31 ENCOUNTER — Ambulatory Visit (INDEPENDENT_AMBULATORY_CARE_PROVIDER_SITE_OTHER): Payer: Commercial Managed Care - HMO | Admitting: Nurse Practitioner

## 2023-10-31 VITALS — BP 130/80 | HR 101 | Temp 98.5°F | Ht 69.5 in | Wt 278.6 lb

## 2023-10-31 DIAGNOSIS — E785 Hyperlipidemia, unspecified: Secondary | ICD-10-CM | POA: Diagnosis not present

## 2023-10-31 DIAGNOSIS — R5382 Chronic fatigue, unspecified: Secondary | ICD-10-CM | POA: Diagnosis not present

## 2023-10-31 DIAGNOSIS — Z7984 Long term (current) use of oral hypoglycemic drugs: Secondary | ICD-10-CM

## 2023-10-31 DIAGNOSIS — E559 Vitamin D deficiency, unspecified: Secondary | ICD-10-CM

## 2023-10-31 DIAGNOSIS — E118 Type 2 diabetes mellitus with unspecified complications: Secondary | ICD-10-CM

## 2023-10-31 DIAGNOSIS — F419 Anxiety disorder, unspecified: Secondary | ICD-10-CM | POA: Diagnosis not present

## 2023-10-31 MED ORDER — CLONAZEPAM 1 MG PO TABS: 1.0000 mg | ORAL_TABLET | Freq: Every day | ORAL | 2 refills | Status: DC | PRN

## 2023-11-01 LAB — COMPREHENSIVE METABOLIC PANEL
ALT: 33 [IU]/L (ref 0–44)
AST: 28 [IU]/L (ref 0–40)
Albumin: 4.5 g/dL (ref 3.8–4.9)
Alkaline Phosphatase: 101 [IU]/L (ref 44–121)
BUN/Creatinine Ratio: 15 (ref 9–20)
BUN: 14 mg/dL (ref 6–24)
Bilirubin Total: 0.3 mg/dL (ref 0.0–1.2)
CO2: 21 mmol/L (ref 20–29)
Calcium: 9.9 mg/dL (ref 8.7–10.2)
Chloride: 102 mmol/L (ref 96–106)
Creatinine, Ser: 0.91 mg/dL (ref 0.76–1.27)
Globulin, Total: 2.4 g/dL (ref 1.5–4.5)
Glucose: 111 mg/dL — ABNORMAL HIGH (ref 70–99)
Potassium: 4.1 mmol/L (ref 3.5–5.2)
Sodium: 140 mmol/L (ref 134–144)
Total Protein: 6.9 g/dL (ref 6.0–8.5)
eGFR: 100 mL/min/{1.73_m2} (ref >59)

## 2023-11-01 LAB — DRUG SCREEN 764883 11+OXYCO+ALC+CRT-BUND
Amphetamines, Urine: NEGATIVE ng/mL
BENZODIAZ UR QL: NEGATIVE ng/mL
Barbiturate: NEGATIVE ng/mL
Cannabinoid Quant, Ur: NEGATIVE ng/mL
Cocaine (Metabolite): NEGATIVE ng/mL
Creatinine: 87.4 mg/dL (ref 20.0–300.0)
Ethanol: NEGATIVE %
Meperidine: NEGATIVE ng/mL
Methadone Screen, Urine: NEGATIVE ng/mL
OPIATE SCREEN URINE: NEGATIVE ng/mL
Oxycodone/Oxymorphone, Urine: NEGATIVE ng/mL
Phencyclidine: NEGATIVE ng/mL
Propoxyphene: NEGATIVE ng/mL
Tramadol: NEGATIVE ng/mL
pH, Urine: 6 (ref 4.5–8.9)

## 2023-11-01 LAB — LIPID PANEL
Chol/HDL Ratio: 5.4 ratio — ABNORMAL HIGH (ref 0.0–5.0)
Cholesterol, Total: 147 mg/dL (ref 100–199)
HDL: 27 mg/dL — ABNORMAL LOW (ref >39)
LDL Chol Calc (NIH): 91 mg/dL (ref 0–99)
Triglycerides: 162 mg/dL — ABNORMAL HIGH (ref 0–149)
VLDL Cholesterol Cal: 29 mg/dL (ref 5–40)

## 2023-11-01 LAB — VITAMIN D 25 HYDROXY (VIT D DEFICIENCY, FRACTURES): Vit D, 25-Hydroxy: 23.6 ng/mL — ABNORMAL LOW (ref 30.0–100.0)

## 2023-11-01 LAB — HEMOGLOBIN A1C
Est. average glucose Bld gHb Est-mCnc: 206 mg/dL
Hgb A1c MFr Bld: 8.8 % — ABNORMAL HIGH (ref 4.8–5.6)

## 2023-11-01 MED ORDER — VITAMIN D (ERGOCALCIFEROL) 1.25 MG (50000 UNIT) PO CAPS: 50000.0000 [IU] | ORAL_CAPSULE | ORAL | 0 refills | Status: DC

## 2023-11-01 MED ORDER — METFORMIN HCL 500 MG PO TABS: 500.0000 mg | ORAL_TABLET | Freq: Two times a day (BID) | ORAL | 1 refills | Status: DC

## 2023-11-01 NOTE — Assessment & Plan Note (Signed)
Repeat Vitamin D level today.  Will treat based on results.

## 2023-11-01 NOTE — Assessment & Plan Note (Signed)
Chronic.  Controlled.  Continue with current medication regimen of Atorvastatin.  Labs ordered today.  Return to clinic in 6 months for reevaluation.  Call sooner if concerns arise.   

## 2023-11-01 NOTE — Assessment & Plan Note (Signed)
Chronic.  Controlled.  Continue with current medication regimen of Clonazepam daily.  Controlled substance agreement and UDS updated today.  Understands risks of taking Benzodiazepine and okay with continuing. Labs ordered today.  Return to clinic in 3 months for reevaluation.  Call sooner if concerns arise.

## 2023-11-01 NOTE — Assessment & Plan Note (Signed)
Chronic.  Patient has stopped taking Metformin.  Recommend restarting Metformin. Will increase to 1000mg  BID if A1c is >8%.  Patient does not want to do an injection.  Can add Rybelsus.  Eye exam and foot exam up to date.  Follow up in 3 months.  Call sooner if concerns arise.

## 2023-11-08 ENCOUNTER — Ambulatory Visit: Payer: Managed Care, Other (non HMO) | Admitting: Dermatology

## 2023-11-08 DIAGNOSIS — C44612 Basal cell carcinoma of skin of right upper limb, including shoulder: Secondary | ICD-10-CM

## 2023-11-08 DIAGNOSIS — D492 Neoplasm of unspecified behavior of bone, soft tissue, and skin: Secondary | ICD-10-CM

## 2023-11-08 DIAGNOSIS — L821 Other seborrheic keratosis: Secondary | ICD-10-CM

## 2023-11-08 DIAGNOSIS — L578 Other skin changes due to chronic exposure to nonionizing radiation: Secondary | ICD-10-CM | POA: Diagnosis not present

## 2023-11-08 DIAGNOSIS — L570 Actinic keratosis: Secondary | ICD-10-CM | POA: Diagnosis not present

## 2023-11-08 DIAGNOSIS — C4441 Basal cell carcinoma of skin of scalp and neck: Secondary | ICD-10-CM

## 2023-11-08 DIAGNOSIS — W908XXA Exposure to other nonionizing radiation, initial encounter: Secondary | ICD-10-CM | POA: Diagnosis not present

## 2023-11-08 DIAGNOSIS — Z1283 Encounter for screening for malignant neoplasm of skin: Secondary | ICD-10-CM | POA: Diagnosis not present

## 2023-11-08 DIAGNOSIS — D1801 Hemangioma of skin and subcutaneous tissue: Secondary | ICD-10-CM

## 2023-11-08 DIAGNOSIS — Z85828 Personal history of other malignant neoplasm of skin: Secondary | ICD-10-CM

## 2023-11-08 DIAGNOSIS — D229 Melanocytic nevi, unspecified: Secondary | ICD-10-CM

## 2023-11-08 DIAGNOSIS — L820 Inflamed seborrheic keratosis: Secondary | ICD-10-CM | POA: Diagnosis not present

## 2023-11-08 DIAGNOSIS — L814 Other melanin hyperpigmentation: Secondary | ICD-10-CM

## 2023-11-08 DIAGNOSIS — D485 Neoplasm of uncertain behavior of skin: Secondary | ICD-10-CM

## 2023-11-08 NOTE — Patient Instructions (Addendum)

## 2023-11-08 NOTE — Progress Notes (Signed)
Follow-Up Visit   Subjective  Johnny Rios is a 57 y.o. male who presents for the following: Skin Cancer Screening and Full Body Skin Exam  The patient presents for Total-Body Skin Exam (TBSE) for skin cancer screening and mole check. The patient has spots, moles and lesions to be evaluated, some may be new or changing and the patient may have concern these could be cancer.    The following portions of the chart were reviewed this encounter and updated as appropriate: medications, allergies, medical history  Review of Systems:  No other skin or systemic complaints except as noted in HPI or Assessment and Plan.  Objective  Well appearing patient in no apparent distress; mood and affect are within normal limits.  A full examination was performed including scalp, head, eyes, ears, nose, lips, neck, chest, axillae, abdomen, back, buttocks, bilateral upper extremities, bilateral lower extremities, hands, feet, fingers, toes, fingernails, and toenails. All findings within normal limits unless otherwise noted below.   Relevant physical exam findings are noted in the Assessment and Plan.  L temple x 1, L scalp x 1 (2) Erythematous thin papules/macules with gritty scale.   R crown scalp 1.2 cm pink patch     R post shoulder 1.2 cm pink patch     R ant shoulder Pink patch.   L ant top of shoulder Pink patch.   Trunk and arms x 5, L leg x 5 (10) Erythematous stuck-on, waxy papule or plaque    Assessment & Plan   SKIN CANCER SCREENING PERFORMED TODAY.  ACTINIC DAMAGE - Chronic condition, secondary to cumulative UV/sun exposure - diffuse scaly erythematous macules with underlying dyspigmentation - Recommend daily broad spectrum sunscreen SPF 30+ to sun-exposed areas, reapply every 2 hours as needed.  - Staying in the shade or wearing long sleeves, sun glasses (UVA+UVB protection) and wide brim hats (4-inch brim around the entire circumference of the hat) are also  recommended for sun protection.  - Call for new or changing lesions.  LENTIGINES, SEBORRHEIC KERATOSES, HEMANGIOMAS - Benign normal skin lesions - Benign-appearing - Call for any changes  MELANOCYTIC NEVI - Tan-brown and/or pink-flesh-colored symmetric macules and papules - Benign appearing on exam today - Observation - Call clinic for new or changing moles - Recommend daily use of broad spectrum spf 30+ sunscreen to sun-exposed areas.   HISTORY OF BASAL CELL CARCINOMA OF THE SKIN - No evidence of recurrence today - Recommend regular full body skin exams - Recommend daily broad spectrum sunscreen SPF 30+ to sun-exposed areas, reapply every 2 hours as needed.  - Call if any new or changing lesions are noted between office visits  AK (actinic keratosis) (2) L temple x 1, L scalp x 1  Actinic keratoses are precancerous spots that appear secondary to cumulative UV radiation exposure/sun exposure over time. They are chronic with expected duration over 1 year. A portion of actinic keratoses will progress to squamous cell carcinoma of the skin. It is not possible to reliably predict which spots will progress to skin cancer and so treatment is recommended to prevent development of skin cancer.  Recommend daily broad spectrum sunscreen SPF 30+ to sun-exposed areas, reapply every 2 hours as needed.  Recommend staying in the shade or wearing long sleeves, sun glasses (UVA+UVB protection) and wide brim hats (4-inch brim around the entire circumference of the hat). Call for new or changing lesions.   Destruction of lesion - L temple x 1, L scalp x 1 (2) Complexity:  simple   Destruction method: cryotherapy   Informed consent: discussed and consent obtained   Timeout:  patient name, date of birth, surgical site, and procedure verified Lesion destroyed using liquid nitrogen: Yes   Region frozen until ice ball extended beyond lesion: Yes   Outcome: patient tolerated procedure well with no  complications   Post-procedure details: wound care instructions given    Neoplasm of uncertain behavior of skin (4) R crown scalp  Epidermal / dermal shaving  Lesion diameter (cm):  1.2 Informed consent: discussed and consent obtained   Timeout: patient name, date of birth, surgical site, and procedure verified   Procedure prep:  Patient was prepped and draped in usual sterile fashion Prep type:  Isopropyl alcohol Anesthesia: the lesion was anesthetized in a standard fashion   Anesthetic:  1% lidocaine w/ epinephrine 1-100,000 buffered w/ 8.4% NaHCO3 Instrument used: flexible razor blade   Hemostasis achieved with: pressure, aluminum chloride and electrodesiccation   Outcome: patient tolerated procedure well   Post-procedure details: sterile dressing applied and wound care instructions given   Dressing type: bandage and petrolatum    Destruction of lesion Complexity: extensive   Destruction method: electrodesiccation and curettage   Informed consent: discussed and consent obtained   Timeout:  patient name, date of birth, surgical site, and procedure verified Procedure prep:  Patient was prepped and draped in usual sterile fashion Prep type:  Isopropyl alcohol Anesthesia: the lesion was anesthetized in a standard fashion   Anesthetic:  1% lidocaine w/ epinephrine 1-100,000 buffered w/ 8.4% NaHCO3 Curettage performed in three different directions: Yes   Electrodesiccation performed over the curetted area: Yes   Lesion length (cm):  1.2 Lesion width (cm):  1.2 Final wound size (cm):  1.2 Hemostasis achieved with:  pressure, aluminum chloride and electrodesiccation Outcome: patient tolerated procedure well with no complications   Post-procedure details: sterile dressing applied and wound care instructions given   Dressing type: bandage and petrolatum    Specimen 1 - Surgical pathology Differential Diagnosis: D48.5 r/o BCC ED&C today Check Margins: No  R post  shoulder  Epidermal / dermal shaving  Lesion diameter (cm):  1.2 Informed consent: discussed and consent obtained   Timeout: patient name, date of birth, surgical site, and procedure verified   Procedure prep:  Patient was prepped and draped in usual sterile fashion Prep type:  Isopropyl alcohol Anesthesia: the lesion was anesthetized in a standard fashion   Anesthetic:  1% lidocaine w/ epinephrine 1-100,000 buffered w/ 8.4% NaHCO3 Instrument used: flexible razor blade   Hemostasis achieved with: pressure, aluminum chloride and electrodesiccation   Outcome: patient tolerated procedure well   Post-procedure details: sterile dressing applied and wound care instructions given   Dressing type: bandage and petrolatum    Destruction of lesion Complexity: extensive   Destruction method: electrodesiccation and curettage   Informed consent: discussed and consent obtained   Timeout:  patient name, date of birth, surgical site, and procedure verified Procedure prep:  Patient was prepped and draped in usual sterile fashion Prep type:  Isopropyl alcohol Anesthesia: the lesion was anesthetized in a standard fashion   Anesthetic:  1% lidocaine w/ epinephrine 1-100,000 buffered w/ 8.4% NaHCO3 Curettage performed in three different directions: Yes   Electrodesiccation performed over the curetted area: Yes   Lesion length (cm):  1.2 Lesion width (cm):  1.2 Final wound size (cm):  1.2 Hemostasis achieved with:  pressure, aluminum chloride and electrodesiccation Outcome: patient tolerated procedure well with no complications   Post-procedure  details: sterile dressing applied and wound care instructions given   Dressing type: bandage and petrolatum    Specimen 2 - Surgical pathology Differential Diagnosis: D48.5 r/o BCC ED&C today Check Margins:No  R ant shoulder  L ant top of shoulder  Plan bx and ED&C of R ant deltoid, L ant top of shoulder at follow up appointment.   Inflamed seborrheic  keratosis (10) Trunk and arms x 5, L leg x 5  Symptomatic, irritating, patient would like treated.   Destruction of lesion - Trunk and arms x 5, L leg x 5 (10) Complexity: simple   Destruction method: cryotherapy   Informed consent: discussed and consent obtained   Timeout:  patient name, date of birth, surgical site, and procedure verified Lesion destroyed using liquid nitrogen: Yes   Region frozen until ice ball extended beyond lesion: Yes   Outcome: patient tolerated procedure well with no complications   Post-procedure details: wound care instructions given     Return in about 1 year (around 11/07/2024) for TBSE; .  I, Cari Caraway, CMA, am acting as scribe for Armida Sans, MD .   Documentation: I have reviewed the above documentation for accuracy and completeness, and I agree with the above.  Armida Sans, MD

## 2023-11-13 LAB — SURGICAL PATHOLOGY

## 2023-11-14 ENCOUNTER — Telehealth: Payer: Self-pay

## 2023-11-14 NOTE — Telephone Encounter (Addendum)
Tried calling patient regarding results. No answer. LM for patient to return call.     ----- Message from Armida Sans sent at 11/14/2023  8:59 AM EST ----- FINAL DIAGNOSIS        1. Skin, R crown scalp :       BASAL CELL CARCINOMA, NODULAR PATTERN        2. Skin, R post shoulder :       SUPERFICIAL BASAL CELL CARCINOMA, PINKUS TYPE (FIBROEPITHELIAL TUMOR OF PINKUS)   1&2 - both cancer = BCC Both already treated Recheck next visit

## 2023-11-14 NOTE — Telephone Encounter (Signed)
Patient advised of BX results .aw 

## 2023-11-15 ENCOUNTER — Encounter: Payer: Self-pay | Admitting: Dermatology

## 2023-11-28 ENCOUNTER — Encounter: Payer: Self-pay | Admitting: Dermatology

## 2023-11-28 ENCOUNTER — Ambulatory Visit: Payer: Commercial Managed Care - HMO | Admitting: Dermatology

## 2023-11-28 DIAGNOSIS — C44619 Basal cell carcinoma of skin of left upper limb, including shoulder: Secondary | ICD-10-CM | POA: Diagnosis not present

## 2023-11-28 DIAGNOSIS — Z7189 Other specified counseling: Secondary | ICD-10-CM

## 2023-11-28 DIAGNOSIS — W908XXA Exposure to other nonionizing radiation, initial encounter: Secondary | ICD-10-CM

## 2023-11-28 DIAGNOSIS — W891XXA Exposure to tanning bed, initial encounter: Secondary | ICD-10-CM

## 2023-11-28 DIAGNOSIS — D492 Neoplasm of unspecified behavior of bone, soft tissue, and skin: Secondary | ICD-10-CM | POA: Diagnosis not present

## 2023-11-28 DIAGNOSIS — C44612 Basal cell carcinoma of skin of right upper limb, including shoulder: Secondary | ICD-10-CM | POA: Diagnosis not present

## 2023-11-28 DIAGNOSIS — L578 Other skin changes due to chronic exposure to nonionizing radiation: Secondary | ICD-10-CM

## 2023-11-28 DIAGNOSIS — D485 Neoplasm of uncertain behavior of skin: Secondary | ICD-10-CM

## 2023-11-28 DIAGNOSIS — L918 Other hypertrophic disorders of the skin: Secondary | ICD-10-CM

## 2023-11-28 NOTE — Progress Notes (Unsigned)
Follow-Up Visit   Subjective  Johnny Rios is a 57 y.o. male who presents for the following: Irregular skin lesions on the R ant deltoid and L ant top of shoulder. Pt here today for bx and ED&C.  The patient has spots, moles and lesions to be evaluated, some may be new or changing and the patient may have concern these could be cancer.   The following portions of the chart were reviewed this encounter and updated as appropriate: medications, allergies, medical history  Review of Systems:  No other skin or systemic complaints except as noted in HPI or Assessment and Plan.  Objective  Well appearing patient in no apparent distress; mood and affect are within normal limits.   A focused examination was performed of the following areas: the face, scalp, trunk, and extremities   Relevant exam findings are noted in the Assessment and Plan.  R ant shoulder lat 1.5 cm crusted pink patch.       R ant shoulder med 1.1 cm crusted pink patch.  R ant shoulder inf 1.1 cm crusted pink patch.  L ant top of shoulder 1.2 cm crusted pink patch.     Assessment & Plan   Neoplasm of uncertain behavior of skin (4) R ant shoulder lat  Epidermal / dermal shaving  Lesion diameter (cm):  1.5 Informed consent: discussed and consent obtained   Timeout: patient name, date of birth, surgical site, and procedure verified   Procedure prep:  Patient was prepped and draped in usual sterile fashion Prep type:  Isopropyl alcohol Anesthesia: the lesion was anesthetized in a standard fashion   Anesthetic:  1% lidocaine w/ epinephrine 1-100,000 buffered w/ 8.4% NaHCO3 Instrument used: flexible razor blade   Hemostasis achieved with: pressure, aluminum chloride and electrodesiccation   Outcome: patient tolerated procedure well   Post-procedure details: sterile dressing applied and wound care instructions given   Dressing type: bandage and petrolatum    Destruction of lesion Complexity:  extensive   Destruction method: electrodesiccation and curettage   Informed consent: discussed and consent obtained   Timeout:  patient name, date of birth, surgical site, and procedure verified Procedure prep:  Patient was prepped and draped in usual sterile fashion Prep type:  Isopropyl alcohol Anesthesia: the lesion was anesthetized in a standard fashion   Anesthetic:  1% lidocaine w/ epinephrine 1-100,000 buffered w/ 8.4% NaHCO3 Curettage performed in three different directions: Yes   Electrodesiccation performed over the curetted area: Yes   Lesion length (cm):  1.5 Lesion width (cm):  1.5 Margin per side (cm):  0.2 Final wound size (cm):  1.9 Hemostasis achieved with:  pressure, aluminum chloride and electrodesiccation Outcome: patient tolerated procedure well with no complications   Post-procedure details: sterile dressing applied and wound care instructions given   Dressing type: bandage and petrolatum    Specimen 1 - Surgical pathology Differential Diagnosis: D48.5 r/o BCC ED&C today Check Margins: No  R ant shoulder med  Epidermal / dermal shaving  Lesion diameter (cm):  1.1 Informed consent: discussed and consent obtained   Timeout: patient name, date of birth, surgical site, and procedure verified   Procedure prep:  Patient was prepped and draped in usual sterile fashion Prep type:  Isopropyl alcohol Anesthesia: the lesion was anesthetized in a standard fashion   Anesthetic:  1% lidocaine w/ epinephrine 1-100,000 buffered w/ 8.4% NaHCO3 Instrument used: flexible razor blade   Hemostasis achieved with: pressure, aluminum chloride and electrodesiccation   Outcome: patient tolerated procedure  well   Post-procedure details: sterile dressing applied and wound care instructions given   Dressing type: bandage and petrolatum    Destruction of lesion Complexity: extensive   Destruction method: electrodesiccation and curettage   Informed consent: discussed and consent  obtained   Timeout:  patient name, date of birth, surgical site, and procedure verified Procedure prep:  Patient was prepped and draped in usual sterile fashion Prep type:  Isopropyl alcohol Anesthesia: the lesion was anesthetized in a standard fashion   Anesthetic:  1% lidocaine w/ epinephrine 1-100,000 buffered w/ 8.4% NaHCO3 Curettage performed in three different directions: Yes   Electrodesiccation performed over the curetted area: Yes   Lesion length (cm):  1.1 Lesion width (cm):  1.1 Margin per side (cm):  0.2 Final wound size (cm):  1.5 Hemostasis achieved with:  pressure, aluminum chloride and electrodesiccation Outcome: patient tolerated procedure well with no complications   Post-procedure details: sterile dressing applied and wound care instructions given   Dressing type: bandage and petrolatum    Specimen 2 - Surgical pathology Differential Diagnosis: D48.5 r/o BCC  ED&C today Check Margins: No  R ant shoulder inf  Epidermal / dermal shaving  Lesion diameter (cm):  1.1 Informed consent: discussed and consent obtained   Timeout: patient name, date of birth, surgical site, and procedure verified   Procedure prep:  Patient was prepped and draped in usual sterile fashion Prep type:  Isopropyl alcohol Anesthesia: the lesion was anesthetized in a standard fashion   Anesthetic:  1% lidocaine w/ epinephrine 1-100,000 buffered w/ 8.4% NaHCO3 Instrument used: flexible razor blade   Hemostasis achieved with: pressure, aluminum chloride and electrodesiccation   Outcome: patient tolerated procedure well   Post-procedure details: sterile dressing applied and wound care instructions given   Dressing type: bandage and petrolatum    Destruction of lesion Complexity: extensive   Destruction method: electrodesiccation and curettage   Informed consent: discussed and consent obtained   Timeout:  patient name, date of birth, surgical site, and procedure verified Procedure prep:   Patient was prepped and draped in usual sterile fashion Prep type:  Isopropyl alcohol Anesthesia: the lesion was anesthetized in a standard fashion   Anesthetic:  1% lidocaine w/ epinephrine 1-100,000 buffered w/ 8.4% NaHCO3 Curettage performed in three different directions: Yes   Electrodesiccation performed over the curetted area: Yes   Lesion length (cm):  1.1 Lesion width (cm):  1.1 Margin per side (cm):  0.2 Final wound size (cm):  1.5 Hemostasis achieved with:  pressure, aluminum chloride and electrodesiccation Outcome: patient tolerated procedure well with no complications   Post-procedure details: sterile dressing applied and wound care instructions given   Dressing type: bandage and petrolatum    Specimen 3 - Surgical pathology Differential Diagnosis: D48.5 r/o BCC ED&C today Check Margins: No  L ant top of shoulder  Epidermal / dermal shaving  Lesion diameter (cm):  1.2 Informed consent: discussed and consent obtained   Timeout: patient name, date of birth, surgical site, and procedure verified   Procedure prep:  Patient was prepped and draped in usual sterile fashion Prep type:  Isopropyl alcohol Anesthesia: the lesion was anesthetized in a standard fashion   Anesthetic:  1% lidocaine w/ epinephrine 1-100,000 buffered w/ 8.4% NaHCO3 Instrument used: flexible razor blade   Hemostasis achieved with: pressure, aluminum chloride and electrodesiccation   Outcome: patient tolerated procedure well   Post-procedure details: sterile dressing applied and wound care instructions given   Dressing type: bandage and petrolatum  Destruction of lesion Complexity: extensive   Destruction method: electrodesiccation and curettage   Informed consent: discussed and consent obtained   Timeout:  patient name, date of birth, surgical site, and procedure verified Procedure prep:  Patient was prepped and draped in usual sterile fashion Prep type:  Isopropyl alcohol Anesthesia: the  lesion was anesthetized in a standard fashion   Anesthetic:  1% lidocaine w/ epinephrine 1-100,000 buffered w/ 8.4% NaHCO3 Curettage performed in three different directions: Yes   Electrodesiccation performed over the curetted area: Yes   Lesion length (cm):  1.2 Lesion width (cm):  1.2 Margin per side (cm):  0.2 Final wound size (cm):  1.6 Hemostasis achieved with:  pressure, aluminum chloride and electrodesiccation Outcome: patient tolerated procedure well with no complications   Post-procedure details: sterile dressing applied and wound care instructions given   Dressing type: bandage and petrolatum    Specimen 4 - Surgical pathology Differential Diagnosis: D48.5 r/o BCC ED&C today Check Margins: No  Actinic skin damage  Skin tags, multiple acquired  Counseling and coordination of care  Basal cell carcinoma (BCC) of skin of right upper extremity including shoulder  BCC (basal cell carcinoma), shoulder, left  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  - Recommend staying in the shade or wearing long sleeves, sun glasses (UVA+UVB protection) and wide brim hats (4-inch brim around the entire circumference of the hat). - Call for new or changing lesions.  Skin tags Axillae Discussed removal $115 for 15  Return for skin tag removal - schedule in surgery slot for more time per Dr. Gwen Pounds.  Maylene Roes, CMA, am acting as scribe for Armida Sans, MD .   Documentation: I have reviewed the above documentation for accuracy and completeness, and I agree with the above.  Armida Sans, MD

## 2023-11-28 NOTE — Patient Instructions (Signed)
Electrodesiccation and Curettage (“Scrape and Burn”) Wound Care Instructions ° °Leave the original bandage on for 24 hours if possible.  If the bandage becomes soaked or soiled before that time, it is OK to remove it and examine the wound.  A small amount of post-operative bleeding is normal.  If excessive bleeding occurs, remove the bandage, place gauze over the site and apply continuous pressure (no peeking) over the area for 30 minutes. If this does not work, please call our clinic as soon as possible or page your doctor if it is after hours.  ° °Once a day, cleanse the wound with soap and water. It is fine to shower. If a thick crust develops you may use a Q-tip dipped into dilute hydrogen peroxide (mix 1:1 with water) to dissolve it.  Hydrogen peroxide can slow the healing process, so use it only as needed.   ° °After washing, apply petroleum jelly (Vaseline) or an antibiotic ointment if your doctor prescribed one for you, followed by a bandage.   ° °For best healing, the wound should be covered with a layer of ointment at all times. If you are not able to keep the area covered with a bandage to hold the ointment in place, this may mean re-applying the ointment several times a day.  Continue this wound care until the wound has healed and is no longer open. It may take several weeks for the wound to heal and close. ° °Itching and mild discomfort is normal during the healing process. ° °If you have any discomfort, you can take Tylenol (acetaminophen) or ibuprofen as directed on the bottle. (Please do not take these if you have an allergy to them or cannot take them for another reason). ° °Some redness, tenderness and white or yellow material in the wound is normal healing.  If the area becomes very sore and red, or develops a thick yellow-green material (pus), it may be infected; please notify us.   ° °Wound healing continues for up to one year following surgery. It is not unusual to experience pain in the scar  from time to time during the interval.  If the pain becomes severe or the scar thickens, you should notify the office.   ° °A slight amount of redness in a scar is expected for the first six months.  After six months, the redness will fade and the scar will soften and fade.  The color difference becomes less noticeable with time.  If there are any problems, return for a post-op surgery check at your earliest convenience. ° °To improve the appearance of the scar, you can use silicone scar gel, cream, or sheets (such as Mederma or Serica) every night for up to one year. These are available over the counter (without a prescription). ° °Please call our office at [phone] for any questions or concerns. °

## 2023-11-29 LAB — SURGICAL PATHOLOGY

## 2023-12-12 ENCOUNTER — Ambulatory Visit (INDEPENDENT_AMBULATORY_CARE_PROVIDER_SITE_OTHER): Payer: Commercial Managed Care - HMO | Admitting: Dermatology

## 2023-12-12 DIAGNOSIS — L578 Other skin changes due to chronic exposure to nonionizing radiation: Secondary | ICD-10-CM

## 2023-12-12 DIAGNOSIS — L918 Other hypertrophic disorders of the skin: Secondary | ICD-10-CM

## 2023-12-12 DIAGNOSIS — W908XXA Exposure to other nonionizing radiation, initial encounter: Secondary | ICD-10-CM

## 2023-12-12 NOTE — Progress Notes (Signed)
   Follow-Up Visit   Subjective  Johnny Rios is a 57 y.o. male who presents for the following: Skin tags, B/L axilla, pt here today for removal.   The following portions of the chart were reviewed this encounter and updated as appropriate: medications, allergies, medical history  Review of Systems:  No other skin or systemic complaints except as noted in HPI or Assessment and Plan.  Objective  Well appearing patient in no apparent distress; mood and affect are within normal limits.   A focused examination was performed of the following areas:   Relevant exam findings are noted in the Assessment and Plan.  B/L axilla x 7 Fleshy, skin-colored pedunculated papules.    Assessment & Plan   ACTINIC DAMAGE - chronic, secondary to cumulative UV radiation exposure/sun exposure over time - diffuse scaly erythematous macules with underlying dyspigmentation - Recommend daily broad spectrum sunscreen SPF 30+ to sun-exposed areas, reapply every 2 hours as needed.  - Recommend staying in the shade or wearing long sleeves, sun glasses (UVA+UVB protection) and wide brim hats (4-inch brim around the entire circumference of the hat). - Call for new or changing lesions. SKIN TAG B/L axilla x 7 ACROCHORDONS (Skin Tags) - Removal desired by patient - Fleshy, skin-colored pedunculated papules - Benign appearing.  - Patient desires removal. Reviewed that this is not covered by insurance and they will be charged a cosmetic fee for removal. Patient signed non-covered consent.  Procedure Note: - Prior to the procedure, reviewed the expected small wound. Also reviewed the risk of leaving a small scar and the small risk of infection.  PROCEDURE - The areas were prepped with isopropyl alcohol. A small amount of lidocaine 1% with epinephrine was injected at the base of each lesion to achieve good local anesthesia. The skin tags were removed using a snip technique. Aluminum chloride was used for  hemostasis. Petrolatum and a bandage were applied. The procedure was tolerated well. - Wound care was reviewed with the patient. They were advised to call with any concerns.  - Locations: B/L axilla  - Total number of treated acrochordons L axilla x 4, R axilla x 4   Destruction of lesion - B/L axilla x 7 Complexity: simple   Destruction method: cryotherapy   Informed consent: discussed and consent obtained   Timeout:  patient name, date of birth, surgical site, and procedure verified Lesion destroyed using liquid nitrogen: Yes   Region frozen until ice ball extended beyond lesion: Yes   Outcome: patient tolerated procedure well with no complications   Post-procedure details: wound care instructions given    Return for appointment as scheduled.  Johnny Rios, CMA, am acting as scribe for Armida Sans, MD .   Documentation: I have reviewed the above documentation for accuracy and completeness, and I agree with the above.  Armida Sans, MD

## 2023-12-12 NOTE — Patient Instructions (Signed)

## 2023-12-13 ENCOUNTER — Other Ambulatory Visit: Payer: Self-pay | Admitting: Nurse Practitioner

## 2023-12-13 DIAGNOSIS — E782 Mixed hyperlipidemia: Secondary | ICD-10-CM

## 2023-12-13 NOTE — Telephone Encounter (Signed)
Requested Prescriptions  Pending Prescriptions Disp Refills   fenofibrate 160 MG tablet [Pharmacy Med Name: FENOFIBRATE 160MG  TABLET] 90 tablet 0    Sig: TAKE ONE TABLET (160 MG TOTAL) BY MOUTH DAILY.     Cardiovascular:  Antilipid - Fibric Acid Derivatives Failed - 12/13/2023  3:34 PM      Failed - Lipid Panel in normal range within the last 12 months    Cholesterol, Total  Date Value Ref Range Status  10/31/2023 147 100 - 199 mg/dL Final   LDL Chol Calc (NIH)  Date Value Ref Range Status  10/31/2023 91 0 - 99 mg/dL Final   HDL  Date Value Ref Range Status  10/31/2023 27 (L) >39 mg/dL Final   Triglycerides  Date Value Ref Range Status  10/31/2023 162 (H) 0 - 149 mg/dL Final         Passed - ALT in normal range and within 360 days    ALT  Date Value Ref Range Status  10/31/2023 33 0 - 44 IU/L Final         Passed - AST in normal range and within 360 days    AST  Date Value Ref Range Status  10/31/2023 28 0 - 40 IU/L Final         Passed - Cr in normal range and within 360 days    Creatinine  Date Value Ref Range Status  10/31/2023 87.4 20.0 - 300.0 mg/dL Final   Creatinine, Ser  Date Value Ref Range Status  10/31/2023 0.91 0.76 - 1.27 mg/dL Final         Passed - HGB in normal range and within 360 days    Hemoglobin  Date Value Ref Range Status  08/21/2023 13.0 13.0 - 17.7 g/dL Final         Passed - HCT in normal range and within 360 days    Hematocrit  Date Value Ref Range Status  08/21/2023 39.7 37.5 - 51.0 % Final         Passed - PLT in normal range and within 360 days    Platelets  Date Value Ref Range Status  08/21/2023 340 150 - 450 x10E3/uL Final         Passed - WBC in normal range and within 360 days    WBC  Date Value Ref Range Status  08/21/2023 8.7 3.4 - 10.8 x10E3/uL Final  06/16/2021 7.1 4.0 - 10.5 K/uL Final         Passed - eGFR is 30 or above and within 360 days    GFR calc Af Amer  Date Value Ref Range Status  10/01/2020  106 >59 mL/min/1.73 Final    Comment:    **[facility] currently reports eGFR in compliance with the current**   recommendations of the [employer]. [facility] will   update reporting as new guidelines are published from the NKF-ASN   Task force.    GFR, Estimated  Date Value Ref Range Status  06/16/2021 >60 >60 mL/min Final    Comment:    (NOTE) Calculated using the CKD-EPI Creatinine Equation (2021)    eGFR  Date Value Ref Range Status  10/31/2023 100 >59 mL/min/1.73 Final         Passed - Valid encounter within last 12 months    Recent Outpatient Visits           1 month ago Anxiety   [place] [facility] Larae Grooms, NP   3 months ago  Fatigue, unspecified type   [place] [facility] Mecum, Oswaldo Conroy, PA-C   4 months ago Anxiety   [place] [facility] Mecum, Oswaldo Conroy, PA-C   7 months ago Anxiety   [place] [facility] Larae Grooms, NP   10 months ago Annual physical exam   [place] [facility] Larae Grooms, NP       Future Appointments             In 1 month Larae Grooms, NP [facility] [facility]   In 11 months Deirdre Evener, MD [facility]

## 2023-12-18 ENCOUNTER — Encounter: Payer: Self-pay | Admitting: Dermatology

## 2024-01-31 ENCOUNTER — Ambulatory Visit: Payer: No Typology Code available for payment source | Admitting: Nurse Practitioner

## 2024-01-31 VITALS — BP 150/96 | HR 80 | Ht 69.5 in | Wt 276.0 lb

## 2024-01-31 DIAGNOSIS — R030 Elevated blood-pressure reading, without diagnosis of hypertension: Secondary | ICD-10-CM

## 2024-01-31 DIAGNOSIS — E118 Type 2 diabetes mellitus with unspecified complications: Secondary | ICD-10-CM | POA: Diagnosis not present

## 2024-01-31 DIAGNOSIS — Z79899 Other long term (current) drug therapy: Secondary | ICD-10-CM | POA: Diagnosis not present

## 2024-01-31 DIAGNOSIS — Z7984 Long term (current) use of oral hypoglycemic drugs: Secondary | ICD-10-CM

## 2024-01-31 DIAGNOSIS — F419 Anxiety disorder, unspecified: Secondary | ICD-10-CM | POA: Diagnosis not present

## 2024-01-31 DIAGNOSIS — E559 Vitamin D deficiency, unspecified: Secondary | ICD-10-CM

## 2024-01-31 MED ORDER — CLONAZEPAM 1 MG PO TABS: 1.0000 mg | ORAL_TABLET | Freq: Every day | ORAL | 2 refills | Status: DC | PRN

## 2024-01-31 NOTE — Assessment & Plan Note (Signed)
Chronic.  Controlled.  Continue with current medication regimen of Clonazepam daily.  Controlled substance agreement and UDS up to date.  Understands risks of taking Benzodiazepine and okay with continuing. Labs ordered today.  Return to clinic in 3 months for reevaluation.  Call sooner if concerns arise.   

## 2024-01-31 NOTE — Assessment & Plan Note (Signed)
Chronic.  Patient has stopped taking Metformin.  Recommend restarting Metformin. Labs ordered today.  Follow up in 3 months.  Call sooner if concerns arise.

## 2024-01-31 NOTE — Progress Notes (Signed)
 BP (!) 150/96 (BP Location: Right Arm, Patient Position: Sitting, Cuff Size: Large)   Pulse 80   Ht 5' 9.5 (1.765 m)   Wt 276 lb (125.2 kg)   SpO2 97%   BMI 40.17 kg/m    Subjective:    [name], male    DOB: 05-May-1966, 58 y.o.   MRN: [id number]  HPI: Johnny Rios is a 58 y.o. male  Chief Complaint  Patient presents with   Follow-up   Hyperlipidemia   Diabetes   Anxiety   vitamin D    Fatigue   ANXIETY Doing well with Klonopin .  Takes it daily.  Denies concerns at visit today.    DIABETES Hypoglycemic episodes:no Polydipsia/polyuria: no Visual disturbance: no Chest pain: no Paresthesias: no Glucose Monitoring: no  Accucheck frequency: Not Checking  Fasting glucose:  Post prandial:  Evening:  Before meals: Taking Insulin ?: no  Long acting insulin :  Short acting insulin : Blood Pressure Monitoring: weekly Retinal Examination: Not up to Date Foot Exam: Up to Date Diabetic Education: Not Completed Pneumovax: Not up to Date Influenza: Not up to Date Aspirin : no  HYPERTENSION / HYPERLIPIDEMIA Satisfied with current treatment? yes Duration of hypertension: years BP monitoring frequency: daily BP range:  BP medication side effects: no Past BP meds: none Duration of hyperlipidemia: years Cholesterol medication side effects: no Cholesterol supplements: none, fish oil, niacin, and red yeast rice Past cholesterol medications: atorvastatin  Medication compliance: excellent compliance Aspirin : no Recent stressors: no Recurrent headaches: no Visual changes: no Palpitations: no Dyspnea: no Chest pain: no Lower extremity edema: no Dizzy/lightheaded: no   Relevant past medical, surgical, family and social history reviewed and updated as indicated. Interim medical history since our last visit reviewed. Allergies and medications reviewed and updated.  Review of Systems  Eyes:  Negative for visual disturbance.  Respiratory:  Negative  for chest tightness and shortness of breath.   Cardiovascular:  Negative for chest pain, palpitations and leg swelling.  Endocrine: Negative for polydipsia and polyuria.  Neurological:  Negative for dizziness, light-headedness, numbness and headaches.  Psychiatric/Behavioral:  The patient is nervous/anxious.     Per HPI unless specifically indicated above     Objective:    BP (!) 150/96 (BP Location: Right Arm, Patient Position: Sitting, Cuff Size: Large)   Pulse 80   Ht 5' 9.5 (1.765 m)   Wt 276 lb (125.2 kg)   SpO2 97%   BMI 40.17 kg/m   Wt Readings from Last 3 Encounters:  01/31/24 276 lb (125.2 kg)  10/31/23 278 lb 9.6 oz (126.4 kg)  08/21/23 277 lb 9.6 oz (125.9 kg)    Physical Exam Vitals and nursing note reviewed.  Constitutional:      General: He is not in acute distress.    Appearance: Normal appearance. He is not ill-appearing, toxic-appearing or diaphoretic.  HENT:     Head: Normocephalic.     Right Ear: External ear normal.     Left Ear: External ear normal.     Nose: Nose normal. No congestion or rhinorrhea.     Mouth/Throat:     Mouth: Mucous membranes are moist.  Eyes:     General:        Right eye: No discharge.        Left eye: No discharge.     Extraocular Movements: Extraocular movements intact.     Conjunctiva/sclera: Conjunctivae normal.     Pupils: Pupils are equal, round, and reactive to light.  Cardiovascular:  Rate and Rhythm: Normal rate and regular rhythm.     Heart sounds: No murmur heard. Pulmonary:     Effort: Pulmonary effort is normal. No respiratory distress.     Breath sounds: Normal breath sounds. No wheezing, rhonchi or rales.  Abdominal:     General: Abdomen is flat. Bowel sounds are normal.  Musculoskeletal:     Cervical back: Normal range of motion and neck supple.  Skin:    General: Skin is warm and dry.     Capillary Refill: Capillary refill takes less than 2 seconds.  Neurological:     General: No focal deficit  present.     Mental Status: He is alert and oriented to person, place, and time.  Psychiatric:        Mood and Affect: Mood normal.        Behavior: Behavior normal.        Thought Content: Thought content normal.        Judgment: Judgment normal.     Results for orders placed or performed in visit on 11/28/23  Surgical pathology   Collection Time: 11/28/23 12:00 AM  Result Value Ref Range   SURGICAL PATHOLOGY      SURGICAL PATHOLOGY [facility] [address], [address] [place], [place] [address] Telephone [phone] or [phone] Fax [phone]  REPORT OF DERMATOPATHOLOGY   Accession #: [id number] Patient Name: Johnny Rios, Johnny Rios Visit # : [id number]  MRN: [id number] Cytotechnologist: Ephriam Rolla Edelman, Dermatopathologist, Electronic Signature DOB/Age [birth year] (Age: 28) Gender: M Collected Date: 11/28/2023 Received Date: 11/28/2023  FINAL DIAGNOSIS       1. Skin, R ant shoulder lat :       SUPERFICIAL AND NODULAR BASAL CELL CARCINOMA, CRUSTED       2. Skin, R ant shoulder med :       SUPERFICIAL BASAL CELL CARCINOMA       3. Skin, R ant shoulder inf :       SUPERFICIAL BASAL CELL CARCINOMA       4. Skin, L ant top of shoulder :       BASAL CELL CARCINOMA, NODULAR PATTERN       ELECTRONIC SIGNATURE : Depcik-Smith Md, Natalie, Dermatopathologist, Electronic Signature  MICROSCOPIC DESCRIPTION 1. There are aggregates of atypica l basaloid cells arising from the epidermis and maintaining continually with epidermis with dermal nodular aggregates.There is palisading of nuclei at the periphery of these aggregates.The lesion is covered by a prominent scale crust. 2. There are aggregates of atypical basaloid cells arising from the epidermis.There is palisading of nuclei at the periphery of these aggregates.This is a superficial basal cell carcinoma. 3. There are aggregates of atypical basaloid cells arising from  the epidermis.There is palisading of nuclei at the periphery of these aggregates.This is a superficial basal cell carcinoma. 4. There are dermal aggregates of atypical basaloid cells which are predominately in large nests.  CASE COMMENTS STAINS USED IN DIAGNOSIS: H&E H&E H&E H&E    CLINICAL HISTORY  SPECIMEN(S) OBTAINED 1. Skin, R Ant Shoulder Lat 2. Skin, R Ant Shoulder Med 3. Skin, R Ant Shoulder Inf 4. Skin, L Ant Top Of Shoulder  SPECIMEN COMMENTS: 1. 1.5 cm crusted pink p atch, ED&C today 2. 1.1 cm crusted pink patch, ED&C today 3. 1.1 cm crusted pink patch, ED&C today 4. 1.2 cm crusted pink patch, ED&C today SPECIMEN CLINICAL INFORMATION: 1. Neoplasm of uncertain behavior of skin, R/O BCC 2. Neoplasm of uncertain behavior of skin,  R/O BCC 3. Neoplasm of uncertain behavior of skin, R/O BCC 4. Neoplasm of uncertain behavior of skin, R/O BCC    Gross Description 1. Formalin fixed specimen received:  15 X 12 X 1 MM, TOTO (5 P) (1 B) ( hwc ) 2. Formalin fixed specimen received:  11 X 10 X 1 MM, TOTO (4 P) (1 B) ( hwc ) 3. Formalin fixed specimen received:  15 X 9 X 1 MM, TOTO (5 P) (1 B) ( hwc ) 4. Formalin fixed specimen received:  12 X 10 X 1 MM, TOTO (4 P) (1 B) ( hwc )        Report signed out from the following location(s) [place] [address] [place], [place] [address] CLIA #: [id number]  [facility] [address] [place], [place] [address] CLIA #: [id number]       Assessment & Plan:   Problem List Items Addressed This Visit       Endocrine   Controlled diabetes mellitus type 2 with complications (HCC)   Chronic.  Patient has stopped taking Metformin .  Recommend restarting Metformin . Labs ordered today.  Follow up in 3 months.  Call sooner if concerns arise.       Relevant Orders   Comp Met (CMET)   HgB A1c     Other   Anxiety - Primary   Chronic.  Controlled.  Continue with current medication  regimen of Clonazepam  daily.  Controlled substance agreement and UDS up to date.  Understands risks of taking Benzodiazepine and okay with continuing. Labs ordered today.  Return to clinic in 3 months for reevaluation.  Call sooner if concerns arise.       Relevant Medications   clonazePAM  (KLONOPIN ) 1 MG tablet   Long-term current use of benzodiazepine   Other Visit Diagnoses       Vitamin D  deficiency       Labs ordered at visit today. Will make recommendations based on lab results.   Relevant Orders   Vitamin D  (25 hydroxy)     Elevated blood pressure reading       Discussed elevated BP at visit today. If elevated at next visit will discuss medication.        Follow up plan: No follow-ups on file.

## 2024-02-01 ENCOUNTER — Encounter: Payer: Self-pay | Admitting: Nurse Practitioner

## 2024-02-01 LAB — COMPREHENSIVE METABOLIC PANEL
ALT: 21 [IU]/L (ref 0–44)
AST: 17 [IU]/L (ref 0–40)
Albumin: 4.2 g/dL (ref 3.8–4.9)
Alkaline Phosphatase: 111 [IU]/L (ref 44–121)
BUN/Creatinine Ratio: 13 (ref 9–20)
BUN: 13 mg/dL (ref 6–24)
Bilirubin Total: 0.3 mg/dL (ref 0.0–1.2)
CO2: 20 mmol/L (ref 20–29)
Calcium: 9.5 mg/dL (ref 8.7–10.2)
Chloride: 101 mmol/L (ref 96–106)
Creatinine, Ser: 0.99 mg/dL (ref 0.76–1.27)
Globulin, Total: 2.4 g/dL (ref 1.5–4.5)
Glucose: 200 mg/dL — ABNORMAL HIGH (ref 70–99)
Potassium: 4.3 mmol/L (ref 3.5–5.2)
Sodium: 137 mmol/L (ref 134–144)
Total Protein: 6.6 g/dL (ref 6.0–8.5)
eGFR: 90 mL/min/{1.73_m2} (ref >59)

## 2024-02-01 LAB — VITAMIN D 25 HYDROXY (VIT D DEFICIENCY, FRACTURES): Vit D, 25-Hydroxy: 31 ng/mL (ref 30.0–100.0)

## 2024-02-01 LAB — HEMOGLOBIN A1C
Est. average glucose Bld gHb Est-mCnc: 197 mg/dL
Hgb A1c MFr Bld: 8.5 % — ABNORMAL HIGH (ref 4.8–5.6)

## 2024-03-05 ENCOUNTER — Encounter: Payer: Self-pay | Admitting: Nurse Practitioner

## 2024-03-07 NOTE — Telephone Encounter (Signed)
 Appt scheduled

## 2024-03-08 ENCOUNTER — Other Ambulatory Visit: Payer: Self-pay | Admitting: Nurse Practitioner

## 2024-03-08 DIAGNOSIS — E782 Mixed hyperlipidemia: Secondary | ICD-10-CM

## 2024-03-08 NOTE — Telephone Encounter (Signed)
 Requested Prescriptions  Pending Prescriptions Disp Refills   fenofibrate 160 MG tablet [Pharmacy Med Name: FENOFIBRATE 160MG  TABLET] 90 tablet 0    Sig: TAKE ONE TABLET (160 MG TOTAL) BY MOUTH DAILY.     Cardiovascular:  Antilipid - Fibric Acid Derivatives Failed - 03/08/2024  4:52 PM      Failed - Lipid Panel in normal range within the last 12 months    Cholesterol, Total  Date Value Ref Range Status  10/31/2023 147 100 - 199 mg/dL Final   LDL Chol Calc (NIH)  Date Value Ref Range Status  10/31/2023 91 0 - 99 mg/dL Final   HDL  Date Value Ref Range Status  10/31/2023 27 (L) >39 mg/dL Final   Triglycerides  Date Value Ref Range Status  10/31/2023 162 (H) 0 - 149 mg/dL Final         Passed - ALT in normal range and within 360 days    ALT  Date Value Ref Range Status  01/31/2024 21 0 - 44 IU/L Final         Passed - AST in normal range and within 360 days    AST  Date Value Ref Range Status  01/31/2024 17 0 - 40 IU/L Final         Passed - Cr in normal range and within 360 days    Creatinine  Date Value Ref Range Status  10/31/2023 87.4 20.0 - 300.0 mg/dL Final   Creatinine, Ser  Date Value Ref Range Status  01/31/2024 0.99 0.76 - 1.27 mg/dL Final         Passed - HGB in normal range and within 360 days    Hemoglobin  Date Value Ref Range Status  08/21/2023 13.0 13.0 - 17.7 g/dL Final         Passed - HCT in normal range and within 360 days    Hematocrit  Date Value Ref Range Status  08/21/2023 39.7 37.5 - 51.0 % Final         Passed - PLT in normal range and within 360 days    Platelets  Date Value Ref Range Status  08/21/2023 340 150 - 450 x10E3/uL Final         Passed - WBC in normal range and within 360 days    WBC  Date Value Ref Range Status  08/21/2023 8.7 3.4 - 10.8 x10E3/uL Final  06/16/2021 7.1 4.0 - 10.5 K/uL Final         Passed - eGFR is 30 or above and within 360 days    GFR calc Af Amer  Date Value Ref Range Status  10/01/2020  106 >59 mL/min/1.73 Final    Comment:    **[facility] currently reports eGFR in compliance with the current**   recommendations of the [employer]. [facility] will   update reporting as new guidelines are published from the NKF-ASN   Task force.    GFR, Estimated  Date Value Ref Range Status  06/16/2021 >60 >60 mL/min Final    Comment:    (NOTE) Calculated using the CKD-EPI Creatinine Equation (2021)    eGFR  Date Value Ref Range Status  01/31/2024 90 >59 mL/min/1.73 Final         Passed - Valid encounter within last 12 months    Recent Outpatient Visits           4 months ago Anxiety    [facility] Larae Grooms, NP   6 months ago  Fatigue, unspecified type   [place] [facility] Mecum, Oswaldo Conroy, PA-C   7 months ago Anxiety   [place] [facility] Mecum, Oswaldo Conroy, PA-C   10 months ago Anxiety   [place] [facility] Larae Grooms, NP   1 year ago Annual physical exam   [facility] [facility] Larae Grooms, NP       Future Appointments             In 1 month Larae Grooms, NP [facility]   In 8 months Deirdre Evener, MD [facility]

## 2024-03-14 ENCOUNTER — Encounter: Payer: Self-pay | Admitting: Nurse Practitioner

## 2024-03-14 ENCOUNTER — Ambulatory Visit: Admitting: Nurse Practitioner

## 2024-03-14 VITALS — BP 129/92 | HR 102 | Ht 69.5 in | Wt 273.6 lb

## 2024-03-14 DIAGNOSIS — D509 Iron deficiency anemia, unspecified: Secondary | ICD-10-CM

## 2024-03-14 DIAGNOSIS — R5383 Other fatigue: Secondary | ICD-10-CM

## 2024-03-14 DIAGNOSIS — G4733 Obstructive sleep apnea (adult) (pediatric): Secondary | ICD-10-CM

## 2024-03-14 NOTE — Assessment & Plan Note (Signed)
 Will order updated sleep study.  Last sleep study was 7-8 years ago.  Needs updated sleep study and PAP settings.

## 2024-03-14 NOTE — Progress Notes (Signed)
 BP (!) 129/92 (BP Location: Left Arm, Patient Position: Sitting, Cuff Size: Large)   Pulse (!) 102   Ht 5' 9.5" (1.765 m)   Wt 273 lb 9.6 oz (124.1 kg)   SpO2 95%   BMI 39.82 kg/m    Subjective:    [name], male    DOB: [birth year], 58 y.o.   MRN: [id number]  HPI: Johnny Rios is a 58 y.o. male  Chief Complaint  Patient presents with   Fatigue   narcolepsy    Or microsleep. Thinks may be due to low iron or testosterone    Patient states he has ben falling asleep while driving.  Has had an ongoing issue with fatigue.  Does have a CPAP but his sleep study was done 7-8 years ago and has never been adjusted since.  Patient has had IDA but has not been taking the iron supplement due to it saying not to take it without an anti acid.     Relevant past medical, surgical, family and social history reviewed and updated as indicated. Interim medical history since our last visit reviewed. Allergies and medications reviewed and updated.  Review of Systems  Constitutional:  Positive for fatigue.  Psychiatric/Behavioral:  Positive for sleep disturbance.     Per HPI unless specifically indicated above     Objective:    BP (!) 129/92 (BP Location: Left Arm, Patient Position: Sitting, Cuff Size: Large)   Pulse (!) 102   Ht 5' 9.5" (1.765 m)   Wt 273 lb 9.6 oz (124.1 kg)   SpO2 95%   BMI 39.82 kg/m   Wt Readings from Last 3 Encounters:  03/14/24 273 lb 9.6 oz (124.1 kg)  01/31/24 276 lb (125.2 kg)  10/31/23 278 lb 9.6 oz (126.4 kg)    Physical Exam Vitals and nursing note reviewed.  Constitutional:      General: He is not in acute distress.    Appearance: Normal appearance. He is not ill-appearing, toxic-appearing or diaphoretic.  HENT:     Head: Normocephalic.     Right Ear: External ear normal.     Left Ear: External ear normal.     Nose: Nose normal. No congestion or rhinorrhea.     Mouth/Throat:     Mouth: Mucous membranes are moist.  Eyes:      General:        Right eye: No discharge.        Left eye: No discharge.     Extraocular Movements: Extraocular movements intact.     Conjunctiva/sclera: Conjunctivae normal.     Pupils: Pupils are equal, round, and reactive to light.  Cardiovascular:     Rate and Rhythm: Normal rate and regular rhythm.     Heart sounds: No murmur heard. Pulmonary:     Effort: Pulmonary effort is normal. No respiratory distress.     Breath sounds: Normal breath sounds. No wheezing, rhonchi or rales.  Abdominal:     General: Abdomen is flat. Bowel sounds are normal.  Musculoskeletal:     Cervical back: Normal range of motion and neck supple.  Skin:    General: Skin is warm and dry.     Capillary Refill: Capillary refill takes less than 2 seconds.  Neurological:     General: No focal deficit present.     Mental Status: He is alert and oriented to person, place, and time.  Psychiatric:        Mood and Affect: Mood normal.  Behavior: Behavior normal.        Thought Content: Thought content normal.        Judgment: Judgment normal.     Results for orders placed or performed in visit on 01/31/24  Comp Met (CMET)   Collection Time: 01/31/24  2:06 PM  Result Value Ref Range   Glucose 200 (H) 70 - 99 mg/dL   BUN 13 6 - 24 mg/dL   Creatinine, Ser 1.61 0.76 - 1.27 mg/dL   eGFR 90 >09 UE/AVW/0.98   BUN/Creatinine Ratio 13 9 - 20   Sodium 137 134 - 144 mmol/L   Potassium 4.3 3.5 - 5.2 mmol/L   Chloride 101 96 - 106 mmol/L   CO2 20 20 - 29 mmol/L   Calcium 9.5 8.7 - 10.2 mg/dL   Total Protein 6.6 6.0 - 8.5 g/dL   Albumin 4.2 3.8 - 4.9 g/dL   Globulin, Total 2.4 1.5 - 4.5 g/dL   Bilirubin Total 0.3 0.0 - 1.2 mg/dL   Alkaline Phosphatase 111 44 - 121 IU/L   AST 17 0 - 40 IU/L   ALT 21 0 - 44 IU/L  Vitamin D (25 hydroxy)   Collection Time: 01/31/24  2:06 PM  Result Value Ref Range   Vit D, 25-Hydroxy 31.0 30.0 - 100.0 ng/mL  HgB A1c   Collection Time: 01/31/24  2:06 PM  Result Value Ref  Range   Hgb A1c MFr Bld 8.5 (H) 4.8 - 5.6 %   Est. average glucose Bld gHb Est-mCnc 197 mg/dL      Assessment & Plan:   Problem List Items Addressed This Visit       Respiratory   OSA on CPAP - Primary   Will order updated sleep study.  Last sleep study was 7-8 years ago.  Needs updated sleep study and PAP settings.       Relevant Orders   Ambulatory referral to Sleep Studies   Other Visit Diagnoses       Iron deficiency anemia, unspecified iron deficiency anemia type       Discussed how to properly take medication.  Repeat anemia panel ordered today.  Will make recommendations based on results.   Relevant Orders   Anemia Profile B     Other fatigue       testosterone levels ordered during visit. Will make recommendations based on results.   Relevant Orders   Testosterone, free, total([facility])        Follow up plan: Return if symptoms worsen or fail to improve.

## 2024-03-19 ENCOUNTER — Other Ambulatory Visit

## 2024-03-19 DIAGNOSIS — D509 Iron deficiency anemia, unspecified: Secondary | ICD-10-CM

## 2024-03-19 DIAGNOSIS — R7989 Other specified abnormal findings of blood chemistry: Secondary | ICD-10-CM

## 2024-03-19 DIAGNOSIS — R5383 Other fatigue: Secondary | ICD-10-CM

## 2024-03-20 ENCOUNTER — Other Ambulatory Visit

## 2024-03-24 LAB — ANEMIA PROFILE B
Basophils Absolute: 0.1 10*3/uL (ref 0.0–0.2)
Basos: 1 %
EOS (ABSOLUTE): 0.7 10*3/uL — ABNORMAL HIGH (ref 0.0–0.4)
Eos: 8 %
Ferritin: 63 ng/mL (ref 30–400)
Folate: 8 ng/mL (ref >3.0)
Hematocrit: 41.5 % (ref 37.5–51.0)
Hemoglobin: 13.2 g/dL (ref 13.0–17.7)
Immature Grans (Abs): 0 10*3/uL (ref 0.0–0.1)
Immature Granulocytes: 0 %
Iron Saturation: 13 % — ABNORMAL LOW (ref 15–55)
Iron: 51 ug/dL (ref 38–169)
Lymphocytes Absolute: 2.9 10*3/uL (ref 0.7–3.1)
Lymphs: 35 %
MCH: 26.3 pg — ABNORMAL LOW (ref 26.6–33.0)
MCHC: 31.8 g/dL (ref 31.5–35.7)
MCV: 83 fL (ref 79–97)
Monocytes Absolute: 0.5 10*3/uL (ref 0.1–0.9)
Monocytes: 6 %
Neutrophils Absolute: 3.9 10*3/uL (ref 1.4–7.0)
Neutrophils: 50 %
Platelets: 312 10*3/uL (ref 150–450)
RBC: 5.01 x10E6/uL (ref 4.14–5.80)
RDW: 12.3 % (ref 11.6–15.4)
Retic Ct Pct: 1.8 % (ref 0.6–2.6)
Total Iron Binding Capacity: 403 ug/dL (ref 250–450)
UIBC: 352 ug/dL — ABNORMAL HIGH (ref 111–343)
Vitamin B-12: 366 pg/mL (ref 232–1245)
WBC: 8.1 10*3/uL (ref 3.4–10.8)

## 2024-03-24 LAB — TESTOSTERONE, FREE, TOTAL, SHBG
Sex Hormone Binding: 21.9 nmol/L (ref 19.3–76.4)
Testosterone, Free: 9.3 pg/mL (ref 7.2–24.0)
Testosterone: 196 ng/dL — ABNORMAL LOW (ref 264–916)

## 2024-03-25 ENCOUNTER — Encounter: Payer: Self-pay | Admitting: Nurse Practitioner

## 2024-04-09 ENCOUNTER — Telehealth: Payer: Self-pay

## 2024-04-09 NOTE — Telephone Encounter (Signed)
 Called pt and asked him to bring his CPAP Machine with him to his appointment on tomorrow 04/10/24

## 2024-04-10 ENCOUNTER — Ambulatory Visit (INDEPENDENT_AMBULATORY_CARE_PROVIDER_SITE_OTHER): Admitting: Neurology

## 2024-04-10 ENCOUNTER — Encounter: Payer: Self-pay | Admitting: Neurology

## 2024-04-10 DIAGNOSIS — G4719 Other hypersomnia: Secondary | ICD-10-CM | POA: Diagnosis not present

## 2024-04-10 DIAGNOSIS — G4733 Obstructive sleep apnea (adult) (pediatric): Secondary | ICD-10-CM | POA: Diagnosis not present

## 2024-04-10 DIAGNOSIS — Z789 Other specified health status: Secondary | ICD-10-CM | POA: Diagnosis not present

## 2024-04-10 NOTE — Patient Instructions (Signed)
 Please continue using your autoPAP regularly. While your insurance requires that you use PAP at least 4 hours each night on 70% of the nights, I recommend, that you not skip any nights and use it throughout the night if you can. Getting used to PAP and staying with the treatment long term does take time and patience and discipline. Untreated obstructive sleep apnea when it is moderate to severe can have an adverse impact on cardiovascular health and raise her risk for heart disease, arrhythmias, hypertension, congestive heart failure, stroke and diabetes. Untreated obstructive sleep apnea causes sleep disruption, nonrestorative sleep, and sleep deprivation. This can have an impact on your day to day functioning and cause daytime sleepiness and impairment of cognitive function, memory loss, mood disturbance, and problems focussing. Using PAP regularly can improve these symptoms.  We will proceed with home sleep testing for reevaluation of your obstructive sleep apnea.  During the night of home sleep testing, you will skip using your AutoPap machine but other than that, please try to be consistent with treatment and try to make enough time for sleep, allowing for 7 to 8 hours of sleep.  Please try to work on reducing your caffeine intake especially sweet tea intake.

## 2024-04-10 NOTE — Progress Notes (Signed)
 Subjective:    [name] is a 58 y.o. male.  HPI    Huston Foley, MD, PhD [facility] [address], [address] [address] [place], [place] [address]  Dear Clydie Braun,  I saw your patient, Johnny Rios, upon your kind request in my sleep clinic today for evaluation of his obstructive sleep apnea.  The patient is unaccompanied today.  As you know, [name] is a 58 year old male with an underlying medical history of anemia, anxiety, arthritis, basal cell cancer, diabetes, reflux disease, tachycardia and obesity, who was previously diagnosed with obstructive sleep apnea and placed on PAP therapy.  He had sleep testing through our office in 2018.  His home sleep test from 03/22/2017 showed severe obstructive sleep apnea with an AHI of 52.9/h, O2 nadir 65% with significant evidence of nocturnal hypoxemia.  He has been on AutoPap therapy for years, he has not been seen in our clinic in nearly 7 years.  He reports not being fully compliant with his AutoPap.  He admits that he does not always keep a set schedule and does not make enough time for sleep, often falls asleep without putting his CPAP on, he does find it cumbersome to use.  He takes high risk medication in the form of clonazepam 1 mg strength once daily in the morning.  He does not wish to consider coming off of this medication.  He reports that he tried other medications for anxiety.  He recently stopped using his metformin due to concern for side effects. His Epworth sleepiness score is 19 out of 24, fatigue severity score is 27 out of 63.  He endorses feeling sleepy at the wheel.  I reviewed his PAP compliance data from the past 30 days, he used his machine 18 days with percent use days greater than 4 hours at 17%, indicating significantly suboptimal compliance with an average usage for days on treatment of only 3 hours and 33 minutes, residual AHI mildly elevated at 8.4/h, average pressure for the 95th  percentile at 11.3 cm with a range of 5 to 13 cm with EPR of 3.  Leak on the higher side with the 95th percentile at 41.3 L/min.  He uses a fullface mask and has not replaced his supplies on a regular basis but has ordered supplies online, not through his DME provider.  He would be willing to consider treatment with a new machine, does have mouth dryness from treatment but also admits that he does not hydrate well with water.  In fact, he drinks regular tea "all day", more than 4 servings per day on average.  He is a non-smoker and drinks alcohol rarely.  Bedtime is between midnight and 1 AM typically and rise time around 6 AM.  He works in Investment banker, corporate work.  I reviewed your office note from 03/14/2024. He had blood work through your office on 03/19/2024 and I reviewed the results in his electronic chart.  His testosterone was below normal at 196, iron studies showed decreased iron saturation at 13% and ferritin level on the low end of normal at 63, vitamin B12 on the low end of normal at 366.    Of note, he has some weight fluctuation, compared to March 2018 he is about 6 pounds higher.    Previously (copied from previous notes for reference):   07/13/17: 58 year old right-handed gentleman with an underlying medical history of kidney stones, reflux disease, peptic ulcer disease, arthritis, anxiety, status post recent laparoscopic cholecystectomy  on 01/19/2017, and obesity, who presents for follow-up consultation of his obstructive sleep apnea, after recent home sleep testing and starting AutoPap therapy at home. The patient is accompanied by his wife today. I first met him on 03/09/2017 at the request of his primary care physician, at which time the patient reported snoring and severe daytime somnolence as well as apneic pauses while asleep. I invited him for a sleep study. His insurance denied and attended sleep study. He had a home sleep test on 03/22/2017 indicating severe obstructive sleep apnea with an AHI  of 52.9 per hour, O2 nadir of 65%, time below 88% saturation of 209 minutes. His insurance, despite severe day of his sleep disordered breathing denied an attended CPAP titration study. I placed him on AutoPap therapy at home. I reviewed his AutoPap compliance data from 06/12/2017 through 07/11/2017, which is a total of 30 days, during which time he used his machine 25 days with percent used days greater than 4 hours at 70%, indicating adequate compliance with an average usage of 5 hours and 27 minutes, residual AHI 3.4 per hour, 95th percentile of pressure at 12.9 cm, leak, high side with the 95th percentile at 23.8 L/m, pressure setting of 5-13 cm. In the past 90 days his compliance percentage was 79%. He reports doing a little better. He feels less tired during the day, his wife reports that he tends to take less naps during the day. He does have the occasional skipped night because of recent issues, one time he had nausea, one time he had nasal congestion and one time he went on an overnight trip did not take his machine with him. Generally speaking, he feels improved and is motivated to continue with treatment. He is trying to watch what he eats and trying to lose weight but weight has plateaued. He drinks sweet tea mostly during the day, some water.   The patient's allergies, current medications, family history, past medical history, past social history, past surgical history and problem list were reviewed and updated as appropriate.    Previously (copied from previous notes for reference):    03/09/2017: (He) reports snoring and severe/excessive daytime somnolence. I reviewed your office note from 10/31/2016. His Epworth sleepiness score is 22 out of 24 today, fatigue score is 58 out of 63. His girlfriend reports apneic pauses while he is asleep. He is the youngest of 9 total siblings and one brother has OSA, on CPAP. He has 2 children, 95 year old daughter, who has a nearly 18-year-old child, and he  has a 12-year-old son whom he sees on weekends. He feels that he does not have the energy to spend time with him playing or being active as much  as he would like. He denies morning headaches, he has nocturia about once per night on average, denies restless leg symptoms or leg twitching at night. He is a nonsmoker. He drinks alcohol rarely. He drinks caffeine, usually several cups of tea during the day and a cup of coffee at night. He does not have trouble going to sleep or staying asleep. Bedtime and wake time very. His mother lived to be 45 years old. He believes she was tested with a sleep study and had OSA. His father died at 82 with cancer. His mother had dementia. He has had sleep-related symptoms for years. He wakes himself up choking and gasping for air. His girlfriend reports loud snoring and frequent witnessed apneic pauses while he is asleep. 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    His Past Medical History Is Significant For: Past Medical History:  Diagnosis Date   Anxiety    Arthritis    Basal cell carcinoma 05/16/2023   R sup pectoral tx with ED&C   Basal cell carcinoma 05/16/2023   L dorsum forearm tx with ED&C   Basal cell carcinoma 06/07/2023   left top of shoulder, excision   Basal cell carcinoma (BCC) 05/16/2023   sup sternum tx with ED&C   BCC (basal cell carcinoma of skin) 05/03/2023   left chest  treated with [facility] 05/03/23   BCC (basal cell carcinoma of skin) 11/09/2023   right crown scalp trx ED&C   BCC (basal cell carcinoma of skin) 11/09/2023   Right posterior shoulder superficial trx ED&C   Diabetes mellitus without complication (HCC)    GERD (gastroesophageal reflux disease)    Sleep apnea    Uses CPAP   Tachycardia     His Past Surgical History Is Significant For: Past Surgical History:  Procedure Laterality Date   APPENDECTOMY     CHOLECYSTECTOMY N/A 01/19/2017   Procedure: LAPAROSCOPIC CHOLECYSTECTOMY WITH INTRAOPERATIVE CHOLANGIOGRAM;   Surgeon: Earline Mayotte, MD;  Location: [facility] ORS;  Service: General;  Laterality: N/A;   COLONOSCOPY WITH PROPOFOL N/A 09/22/2021   Procedure: COLONOSCOPY WITH PROPOFOL;  Surgeon: Pasty Spillers, MD;  Location: [facility] ENDOSCOPY;  Service: Endoscopy;  Laterality: N/A;   COLONOSCOPY WITH PROPOFOL N/A 02/15/2023   Procedure: COLONOSCOPY WITH PROPOFOL;  Surgeon: Wyline Mood, MD;  Location: [facility] ENDOSCOPY;  Service: Gastroenterology;  Laterality: N/A;   COLONOSCOPY WITH PROPOFOL N/A 08/18/2023   Procedure: COLONOSCOPY WITH PROPOFOL;  Surgeon: Wyline Mood, MD;  Location: [facility] ENDOSCOPY;  Service: Gastroenterology;  Laterality: N/A;   HERNIA REPAIR     LACERATION REPAIR Left 04/04/2019   Procedure: Repair Multiple Lacerations;  Surgeon: Betha Loa, MD;  Location: [facility] OR;  Service: Orthopedics;  Laterality: Left;   MINOR NAILBED REPAIR Left 04/04/2019   Procedure: Minor Nailbed Repair;  Surgeon: Betha Loa, MD;  Location: [facility] OR;  Service: Orthopedics;  Laterality: Left;   NERVE AND TENDON REPAIR Left 04/04/2019   Procedure: IRRIGATION AND DEBRIDEMENT OF FIXATION OF OPEN FRACTURES LONG ,RING FINGER;  Surgeon: Betha Loa, MD;  Location: [facility] OR;  Service: Orthopedics;  Laterality: Left;   POLYPECTOMY  08/18/2023   Procedure: POLYPECTOMY;  Surgeon: Wyline Mood, MD;  Location: [facility] ENDOSCOPY;  Service: Gastroenterology;;   TONSILLECTOMY     UMBILICAL HERNIA REPAIR N/A 06/12/2017   Ventral hernia with 6 cm Ventralex ST mesh.   VENTRAL HERNIA REPAIR N/A 07/11/2018   15 x 20 cm intraperitoneal Ventralex mesh  ;  Surgeon: Earline Mayotte, MD;  Location: [facility] ORS;  Service: General;  Laterality: N/A;    His Family History Is Significant For: Family History  Problem Relation Age of Onset   Cancer Father        lung   Alzheimer's disease Mother    Heart attack Brother    Heart attack Brother     His Social History Is Significant For: Social History   Socioeconomic History   Marital  status: Single    Spouse name: Not on file   Number of children: 2   Years of education: HS   Highest education level: 12th grade  Occupational History   Not on file  Tobacco Use   Smoking status: Never   Smokeless tobacco: Former  Building services engineer status: Never Used  Substance and Sexual Activity   Alcohol use: Yes    Comment: occasional   Drug use: No   Sexual activity: Not Currently  Other Topics Concern   Not on file  Social History Narrative   Drinks 3-4 caffeine drinks a day    Social Drivers of Health   Financial Resource Strain: Low Risk  (01/31/2024)   Overall Financial Resource Strain (CARDIA)    Difficulty of Paying Living Expenses: Not very hard  Food Insecurity: No Food Insecurity (01/31/2024)   Hunger Vital Sign    Worried About Running Out of Food in the Last Year: Never true    Ran Out of Food in the Last Year: Never true  Transportation Needs: No Transportation Needs (01/31/2024)   PRAPARE - Administrator, Civil Service (Medical): No    Lack of Transportation (Non-Medical): No  Physical Activity: Insufficiently Active (01/31/2024)   Exercise Vital Sign    Days of Exercise per Week: 1 day    Minutes of Exercise per Session: 30 min  Stress: No Stress Concern Present (01/31/2024)   [employer] of Occupational Health - Occupational Stress Questionnaire    Feeling of Stress : Not at all  Social Connections: Unknown (01/31/2024)   Social Connection and Isolation Panel [NHANES]    Frequency of Communication with Friends and Family: Once a week    Frequency of Social Gatherings with Friends and Family: Patient declined    Attends Religious Services: Never    Database administrator or Organizations: No    Attends Engineer, structural: Not on file    Marital Status: Divorced    His Allergies Are:  No Known Allergies:   His Current Medications Are:  Outpatient Encounter Medications as of 04/10/2024  Medication Sig   atorvastatin  (LIPITOR) 20 MG tablet Take 1 tablet (20 mg total) by mouth daily.   clonazePAM (KLONOPIN) 1 MG tablet Take 1 tablet (1 mg total) by mouth daily as needed for anxiety. TAKE ONE TABLET DAILY AS NEEDED.   esomeprazole (NEXIUM) 20 MG capsule Take 1 capsule (20 mg total) by mouth every morning.   fenofibrate 160 MG tablet TAKE ONE TABLET (160 MG TOTAL) BY MOUTH DAILY. (Patient not taking: Reported on 04/10/2024)   Iron, Ferrous Sulfate, 325 (65 Fe) MG TABS Take 325 mg by mouth daily. (Patient not taking: Reported on 04/10/2024)   metFORMIN (GLUCOPHAGE) 500 MG tablet Take 1 tablet (500 mg total) by mouth 2 (two) times daily with a meal. (Patient not taking: Reported on 04/10/2024)   ondansetron (ZOFRAN) 4 MG tablet Take 1 tablet (4 mg total) by mouth every 8 (eight) hours as needed for nausea or vomiting. (Patient not taking: Reported on 04/10/2024)   Vitamin D, Ergocalciferol, (DRISDOL) 1.25 MG (50000 UNIT) CAPS capsule Take 1 capsule (50,000 Units total) by mouth once a week. For 12 weeks. Then start OTC Vitamin D3 2,000 unit daily. (Patient not taking: Reported on 04/10/2024)   No facility-administered encounter medications on file as of 04/10/2024.  :   Review of Systems:  Out of a complete 14 point review of systems, all are reviewed and negative with the exception of these symptoms as listed below:  Review of Systems  Neurological:        Room 9 Pt is here Alone. Pt states that he gets 4 hours of sleep per night. Pt states that he falls asleep in the car sometimes. Pt states that he wants to discuss other treatment options.  ESS 19 FSS 27    Objective:  Neurological Exam  Physical Exam Physical Examination:   Vitals:   04/10/24 1425  BP: (!) 136/97  Pulse: 91    General Examination: The patient is a very pleasant 58 y.o. male in no acute distress. He appears well-developed and well-nourished and well groomed.   HEENT: Normocephalic, atraumatic, pupils are equal, round and reactive to  light, tracking well-preserved, no nystagmus noted.  Hearing grossly intact.  Face is symmetric with normal facial animation.  Speech is clear without dysarthria, hypophonia or voice tremor.  Neck with full range of motion, no carotid bruits.  Oropharynx exam reveals: mild to moderate mouth dryness, adequate dental hygiene and marked airway crowding, due to thicker soft palate, larger uvula, smaller airway entry. Mallampati is class II. Tongue protrudes centrally and palate elevates symmetrically. Tonsils are absent. Neck size is about 17.5 inches. He has a mild overbite.   Chest: Clear to auscultation without wheezing, rhonchi or crackles noted.  Heart: S1+S2+0, regular and normal without murmurs, rubs or gallops noted.   Abdomen: Soft, non-tender and non-distended.  Extremities: There is no pitting edema in the distal lower extremities bilaterally.   Skin: Warm and dry without trophic changes noted.   Musculoskeletal: exam reveals no obvious joint deformities.   Neurologically:  Mental status: The patient is awake, alert and oriented in all 4 spheres. His immediate and remote memory, attention, language skills and fund of knowledge are appropriate. There is no evidence of aphasia, agnosia, apraxia or anomia. Speech is clear with normal prosody and enunciation. Thought process is linear. Mood is normal and affect is normal.  Cranial nerves II - XII are as described above under HEENT exam.  Motor exam: Normal bulk, strength and tone is noted. There is no obvious action or resting tremor.  Fine motor skills and coordination: grossly intact.  Cerebellar testing: No dysmetria or intention tremor. There is no truncal or gait ataxia.  Sensory exam: intact to light touch in the upper and lower extremities.  Gait, station and balance: He stands easily. No veering to one side is noted. No leaning to one side is noted. Posture is age-appropriate and stance is narrow based. Gait shows normal stride length  and normal pace. No problems turning are noted.   Assessment and Plan:  In summary, JAVIS ABBOUD is a 58 year old male with an underlying medical history of anemia, anxiety, arthritis, basal cell cancer, diabetes, reflux disease, tachycardia and obesity, who dents for evaluation of his obstructive sleep apnea which was deemed in the severe range in 2018.  He is not currently fully compliant with his AutoPap.  He is not always consistent with his sleep schedule.  We talked about the diagnosis of sleep apnea, particularly OSA, its prognosis and treatment options. We talked about medical/conservative treatments, surgical interventions and non-pharmacological approaches for symptom control. I explained, in particular, the risks and ramifications of untreated moderate to severe OSA, especially with respect to developing cardiovascular disease down the road, including congestive heart failure (CHF), difficult to treat hypertension, cardiac arrhythmias (particularly A-fib), neurovascular complications including TIA, stroke and dementia. Even type 2 diabetes has, in part, been linked to untreated OSA. Symptoms of untreated OSA may include (but may not be limited to) daytime sleepiness, nocturia (i.e. frequent nighttime urination), memory problems, mood irritability and suboptimally controlled or worsening mood disorder such as depression and/or anxiety, lack of energy, lack of motivation, physical discomfort, as well as recurrent headaches, especially morning or nocturnal  headaches. We talked about the importance of maintaining a healthy lifestyle and striving for healthy weight. In addition, we talked about the importance of striving for and maintaining good sleep hygiene.  He is advised to reduce his sweet tea intake.  I cautioned him against using clonazepam on a daily basis and encouraged him to talk to you about potential other options for anxiety management as clonazepam is addicting and a high risk medication,  particularly risky in the context of untreated obstructive sleep apnea.  He indicates that he does not wish to come off the clonazepam.  I recommended a sleep study at this time. I outlined the differences between a laboratory attended sleep study which is considered more comprehensive and accurate over the option of a home sleep test (HST); the latter may lead to underestimation of sleep disordered breathing in some instances and does not help with diagnosing upper airway resistance syndrome and is not accurate enough to diagnose primary central sleep apnea typically.  He is agreeable to pursuing a home sleep test at this time. I outlined possible surgical and non-surgical treatment options of OSA, including the use of a positive airway pressure (PAP) device (i.e. CPAP, AutoPAP/APAP or BiPAP in certain circumstances), a custom-made dental device (aka oral appliance, which would require a referral to a specialist dentist or orthodontist typically, and is generally speaking not considered for patients with full dentures or edentulous state), upper airway surgical options, such as traditional UPPP (which is not considered a first-line treatment) or the Inspire device (hypoglossal nerve stimulator, which would involve a referral for consultation with an ENT surgeon, after careful selection, following inclusion criteria - also not first-line treatment). I explained the PAP treatment option to the patient in detail, as this is generally considered first-line treatment.  The patient indicated that he would be willing to be more consistent with PAP therapy.  We will plan to follow-up after testing.  He is encouraged to be consistent with his current machine.  I answered all his questions today and he was in agreement.   Thank you very much for allowing me to participate in the care of this nice patient. If I can be of any further assistance to you please do not hesitate to call me at  [phone].  Sincerely,   Debbra Fairy, MD, PhD

## 2024-04-16 ENCOUNTER — Telehealth: Payer: Self-pay | Admitting: Neurology

## 2024-04-16 NOTE — Telephone Encounter (Signed)
HST [facility] [employer] pending

## 2024-04-16 NOTE — Telephone Encounter (Signed)
 HST [facility] [employer] no auth req via fax

## 2024-04-26 ENCOUNTER — Ambulatory Visit

## 2024-04-26 DIAGNOSIS — G4733 Obstructive sleep apnea (adult) (pediatric): Secondary | ICD-10-CM

## 2024-04-26 DIAGNOSIS — G4719 Other hypersomnia: Secondary | ICD-10-CM

## 2024-04-26 DIAGNOSIS — Z789 Other specified health status: Secondary | ICD-10-CM

## 2024-05-01 ENCOUNTER — Ambulatory Visit: Payer: No Typology Code available for payment source | Admitting: Nurse Practitioner

## 2024-05-01 ENCOUNTER — Encounter: Payer: Self-pay | Admitting: Nurse Practitioner

## 2024-05-01 VITALS — BP 115/79 | HR 96 | Temp 98.2°F | Resp 15 | Ht 69.02 in | Wt 274.4 lb

## 2024-05-01 DIAGNOSIS — Z7985 Type 2 diabetes mellitus without complications: Secondary | ICD-10-CM | POA: Insufficient documentation

## 2024-05-01 DIAGNOSIS — F419 Anxiety disorder, unspecified: Secondary | ICD-10-CM

## 2024-05-01 DIAGNOSIS — E119 Type 2 diabetes mellitus without complications: Secondary | ICD-10-CM | POA: Diagnosis not present

## 2024-05-01 DIAGNOSIS — G4733 Obstructive sleep apnea (adult) (pediatric): Secondary | ICD-10-CM

## 2024-05-01 DIAGNOSIS — E118 Type 2 diabetes mellitus with unspecified complications: Secondary | ICD-10-CM | POA: Diagnosis not present

## 2024-05-01 DIAGNOSIS — E611 Iron deficiency: Secondary | ICD-10-CM

## 2024-05-01 MED ORDER — IRON (FERROUS SULFATE) 325 (65 FE) MG PO TABS: 325.0000 mg | ORAL_TABLET | Freq: Every day | ORAL | 1 refills | Status: DC

## 2024-05-01 MED ORDER — OMEPRAZOLE 20 MG PO CPDR: 20.0000 mg | DELAYED_RELEASE_CAPSULE | Freq: Every day | ORAL | 1 refills | Status: DC

## 2024-05-01 MED ORDER — OZEMPIC (0.25 OR 0.5 MG/DOSE) 2 MG/1.5ML ~~LOC~~ SOPN: 0.2500 mg | PEN_INJECTOR | SUBCUTANEOUS | 1 refills | Status: DC

## 2024-05-01 MED ORDER — CLONAZEPAM 1 MG PO TABS: 1.0000 mg | ORAL_TABLET | Freq: Every day | ORAL | 2 refills | Status: DC | PRN

## 2024-05-01 MED ORDER — ROSUVASTATIN CALCIUM 5 MG PO TABS: 5.0000 mg | ORAL_TABLET | Freq: Every day | ORAL | 1 refills | Status: DC

## 2024-05-01 NOTE — Progress Notes (Signed)
 BP 115/79 (BP Location: Left Arm, Patient Position: Sitting, Cuff Size: Large)   Pulse 96   Temp 98.2 F (36.8 C) (Oral)   Resp 15   Ht 5' 9.02" (1.753 m)   Wt 274 lb 6.4 oz (124.5 kg)   SpO2 96%   BMI 40.50 kg/m    Subjective:    [name], male    DOB: Nov 20, 1966, 58 y.o.   MRN: [id number]  HPI: Johnny Rios is a 58 y.o. male  Chief Complaint  Patient presents with   HYPERTENSION / HYPERLIPIDEMIA    Random checks at home. This morning 124/96    Diabetes    Not checked at home recently. No issues currently     Anxiety    Normal but stays tired all the time.    ANXIETY Doing well with Klonopin .  Takes it daily.  Denies concerns at visit today.  Aware of the risks of taking it daily.    Patient states he is having back pain that goes up into his neck and shoulder.  Ongoing for a couple of months.  No recent injury.     DIABETES Last A1c was 8.5%. Not taking Metformin .  Does not like the side effects of the medication.  Okay with starting injectable medication. Hypoglycemic episodes:no Polydipsia/polyuria: no Visual disturbance: no Chest pain: no Paresthesias: no Glucose Monitoring: no  Accucheck frequency: Not Checking  Fasting glucose:  Post prandial:  Evening:  Before meals: Taking Insulin ?: no  Long acting insulin :  Short acting insulin :   Relevant past medical, surgical, family and social history reviewed and updated as indicated. Interim medical history since our last visit reviewed. Allergies and medications reviewed and updated.  Review of Systems  Eyes:  Negative for visual disturbance.  Respiratory:  Negative for chest tightness and shortness of breath.   Cardiovascular:  Negative for chest pain, palpitations and leg swelling.  Endocrine: Negative for polydipsia and polyuria.  Neurological:  Negative for dizziness, light-headedness, numbness and headaches.  Psychiatric/Behavioral:  The patient is nervous/anxious.     Per  HPI unless specifically indicated above     Objective:    BP 115/79 (BP Location: Left Arm, Patient Position: Sitting, Cuff Size: Large)   Pulse 96   Temp 98.2 F (36.8 C) (Oral)   Resp 15   Ht 5' 9.02" (1.753 m)   Wt 274 lb 6.4 oz (124.5 kg)   SpO2 96%   BMI 40.50 kg/m   Wt Readings from Last 3 Encounters:  05/01/24 274 lb 6.4 oz (124.5 kg)  04/10/24 272 lb 12.8 oz (123.7 kg)  03/14/24 273 lb 9.6 oz (124.1 kg)    Physical Exam Vitals and nursing note reviewed.  Constitutional:      General: He is not in acute distress.    Appearance: Normal appearance. He is not ill-appearing, toxic-appearing or diaphoretic.  HENT:     Head: Normocephalic.     Right Ear: External ear normal.     Left Ear: External ear normal.     Nose: Nose normal. No congestion or rhinorrhea.     Mouth/Throat:     Mouth: Mucous membranes are moist.  Eyes:     General:        Right eye: No discharge.        Left eye: No discharge.     Extraocular Movements: Extraocular movements intact.     Conjunctiva/sclera: Conjunctivae normal.     Pupils: Pupils are equal, round, and  reactive to light.  Cardiovascular:     Rate and Rhythm: Normal rate and regular rhythm.     Heart sounds: No murmur heard. Pulmonary:     Effort: Pulmonary effort is normal. No respiratory distress.     Breath sounds: Normal breath sounds. No wheezing, rhonchi or rales.  Abdominal:     General: Abdomen is flat. Bowel sounds are normal.  Musculoskeletal:     Cervical back: Normal range of motion and neck supple.  Skin:    General: Skin is warm and dry.     Capillary Refill: Capillary refill takes less than 2 seconds.  Neurological:     General: No focal deficit present.     Mental Status: He is alert and oriented to person, place, and time.  Psychiatric:        Mood and Affect: Mood normal.        Behavior: Behavior normal.        Thought Content: Thought content normal.        Judgment: Judgment normal.     Results for  orders placed or performed in visit on 03/19/24  Testosterone , free, total([facility])   Collection Time: 03/19/24  8:25 AM  Result Value Ref Range   Testosterone  196 (L) 264 - 916 ng/dL   Testosterone , Free 9.3 7.2 - 24.0 pg/mL   Sex Hormone Binding 21.9 19.3 - 76.4 nmol/L  Anemia Profile B   Collection Time: 03/19/24  8:25 AM  Result Value Ref Range   Total Iron  Binding Capacity 403 250 - 450 ug/dL   UIBC 161 (H) 096 - 045 ug/dL   Iron  51 38 - 169 ug/dL   Iron  Saturation 13 (L) 15 - 55 %   Ferritin 63 30 - 400 ng/mL   Vitamin B-12 366 232 - 1,245 pg/mL   Folate 8.0 >3.0 ng/mL   WBC 8.1 3.4 - 10.8 x10E3/uL   RBC 5.01 4.14 - 5.80 x10E6/uL   Hemoglobin 13.2 13.0 - 17.7 g/dL   Hematocrit 40.9 81.1 - 51.0 %   MCV 83 79 - 97 fL   MCH 26.3 (L) 26.6 - 33.0 pg   MCHC 31.8 31.5 - 35.7 g/dL   RDW 91.4 78.2 - 95.6 %   Platelets 312 150 - 450 x10E3/uL   Neutrophils 50 Not Estab. %   Lymphs 35 Not Estab. %   Monocytes 6 Not Estab. %   Eos 8 Not Estab. %   Basos 1 Not Estab. %   Neutrophils Absolute 3.9 1.4 - 7.0 x10E3/uL   Lymphocytes Absolute 2.9 0.7 - 3.1 x10E3/uL   Monocytes Absolute 0.5 0.1 - 0.9 x10E3/uL   EOS (ABSOLUTE) 0.7 (H) 0.0 - 0.4 x10E3/uL   Basophils Absolute 0.1 0.0 - 0.2 x10E3/uL   Immature Granulocytes 0 Not Estab. %   Immature Grans (Abs) 0.0 0.0 - 0.1 x10E3/uL   Retic Ct Pct 1.8 0.6 - 2.6 %      Assessment & Plan:   Problem List Items Addressed This Visit       Respiratory   OSA on CPAP   Received supplies for updated sleep study.  Plans to complete it this weekend.          Endocrine   Controlled diabetes mellitus type 2 with complications (HCC)   Chronic. Not well controlled. Will start Ozempic 0.25mg  weekly. Side effects and benefits of medication discussed during visit.  Follow up in 3 months.  Call sooner if concerns arise.  Relevant Medications   rosuvastatin (CRESTOR) 5 MG tablet   Semaglutide,0.25 or 0.5MG /DOS, (OZEMPIC, 0.25 OR 0.5  MG/DOSE,) 2 MG/1.5ML SOPN   Other Relevant Orders   Comp Met (CMET)   HgB A1c   Diabetes mellitus treated with injections of non-insulin  medication (HCC) - Primary   Relevant Medications   rosuvastatin (CRESTOR) 5 MG tablet   Semaglutide,0.25 or 0.5MG /DOS, (OZEMPIC, 0.25 OR 0.5 MG/DOSE,) 2 MG/1.5ML SOPN     Other   Anxiety   Chronic.  Controlled.  Continue with current medication regimen of Clonazepam  daily.  Controlled substance agreement and UDS up to date.  Understands risks of taking Benzodiazepine and okay with continuing. Labs ordered today.  Return to clinic in 3 months for reevaluation.  Call sooner if concerns arise.       Relevant Medications   clonazePAM  (KLONOPIN ) 1 MG tablet   Other Visit Diagnoses       Iron  deficiency       Has not been taking his iron .  Needs new prescription sent to the phramacy.   Relevant Medications   Iron , Ferrous Sulfate , 325 (65 Fe) MG TABS        Follow up plan: No follow-ups on file.

## 2024-05-01 NOTE — Assessment & Plan Note (Signed)
 Received supplies for updated sleep study.  Plans to complete it this weekend.

## 2024-05-01 NOTE — Assessment & Plan Note (Signed)
Chronic.  Controlled.  Continue with current medication regimen of Clonazepam daily.  Controlled substance agreement and UDS up to date.  Understands risks of taking Benzodiazepine and okay with continuing. Labs ordered today.  Return to clinic in 3 months for reevaluation.  Call sooner if concerns arise.   

## 2024-05-01 NOTE — Assessment & Plan Note (Signed)
 Chronic. Not well controlled. Will start Ozempic 0.25mg  weekly. Side effects and benefits of medication discussed during visit.  Follow up in 3 months.  Call sooner if concerns arise.

## 2024-05-02 LAB — HEMOGLOBIN A1C
Est. average glucose Bld gHb Est-mCnc: 237 mg/dL
Hgb A1c MFr Bld: 9.9 % — ABNORMAL HIGH (ref 4.8–5.6)

## 2024-05-02 LAB — COMPREHENSIVE METABOLIC PANEL WITH GFR
ALT: 29 IU/L (ref 0–44)
AST: 16 IU/L (ref 0–40)
Albumin: 4.3 g/dL (ref 3.8–4.9)
Alkaline Phosphatase: 127 IU/L — ABNORMAL HIGH (ref 44–121)
BUN/Creatinine Ratio: 14 (ref 9–20)
BUN: 13 mg/dL (ref 6–24)
Bilirubin Total: 0.3 mg/dL (ref 0.0–1.2)
CO2: 21 mmol/L (ref 20–29)
Calcium: 9.9 mg/dL (ref 8.7–10.2)
Chloride: 99 mmol/L (ref 96–106)
Creatinine, Ser: 0.95 mg/dL (ref 0.76–1.27)
Globulin, Total: 2.6 g/dL (ref 1.5–4.5)
Glucose: 330 mg/dL — ABNORMAL HIGH (ref 70–99)
Potassium: 4.4 mmol/L (ref 3.5–5.2)
Sodium: 136 mmol/L (ref 134–144)
Total Protein: 6.9 g/dL (ref 6.0–8.5)
eGFR: 94 mL/min/{1.73_m2} (ref >59)

## 2024-05-03 ENCOUNTER — Encounter: Payer: Self-pay | Admitting: Nurse Practitioner

## 2024-05-03 ENCOUNTER — Telehealth: Payer: Self-pay

## 2024-05-03 NOTE — Telephone Encounter (Signed)
 PA for Ozempic  initiated and submitted via Cover My Meds. Key: [id number]

## 2024-05-06 ENCOUNTER — Ambulatory Visit: Payer: Self-pay

## 2024-05-06 DIAGNOSIS — G8929 Other chronic pain: Secondary | ICD-10-CM

## 2024-05-06 NOTE — Telephone Encounter (Signed)
 Copied from CRM [id number]. Topic: Clinical - Red Word Triage >> May 06, 2024  8:13 AM Juluis Ok wrote: [facility] that prompted transfer to Nurse Triage: lower back and neck pain   Chief Complaint: Back pain Symptoms: radiating up to shoulders and neck Frequency: constant Pertinent Negatives: Patient denies new falls Disposition: [] ED /[] Urgent Care (no appt availability in office) / [x] Appointment(In office/virtual)/ []  [place] [facility]/ [] Home Care/ [] Refused Recommended Disposition /[] [place] [facility]/ []  Follow-up with PCP Additional Notes: Patient reports back and neck pain x several years. Requesting a referral to pain management office he previously was referred to. RN offered appt for tomorrow, pt declined for now states that he wants to see if he can get a referral first instead Reason for Disposition  Back pain present > 2 weeks  Answer Assessment - Initial Assessment Questions 1. ONSET: "When did the pain begin?"      "For a while"  2. LOCATION: "Where does it hurt?" (upper, mid or lower back)     Low back  3. SEVERITY: "How bad is the pain?"  (e.g., Scale 1-10; mild, moderate, or severe)   - MILD (1-3): Doesn't interfere with normal activities.    - MODERATE (4-7): Interferes with normal activities or awakens from sleep.    - SEVERE (8-10): Excruciating pain, unable to do any normal activities.      8/10 pain  4. PATTERN: "Is the pain constant?" (e.g., yes, no; constant, intermittent)      Constant  5. RADIATION: "Does the pain shoot into your legs or somewhere else?"     Radiates to shoulder and neck  6. CAUSE:  "What do you think is causing the back pain?"      Bulging disc  7. BACK OVERUSE:  "Any recent lifting of heavy objects, strenuous work or exercise?"     Walks daily on concrete  8. MEDICINES: "What have you taken so far for the pain?" (e.g., nothing, acetaminophen , NSAIDS)     Advice  9. NEUROLOGIC SYMPTOMS: "Do you have any weakness,  numbness, or problems with bowel/bladder control?"     Occasional numbness in legs   10. OTHER SYMPTOMS: "Do you have any other symptoms?" (e.g., fever, abdomen pain, burning with urination, blood in urine)       No  Protocols used: Back Pain-A-AH

## 2024-05-06 NOTE — Telephone Encounter (Signed)
 Please find out what pain management clinic he used to see so we can send the referral there.

## 2024-05-06 NOTE — Telephone Encounter (Signed)
 Contacted patient. Patient states that he was seen by pain management today. States he called them today after calling us  and was able to get an appointment to be seen.

## 2024-05-09 ENCOUNTER — Ambulatory Visit: Payer: Self-pay

## 2024-05-09 DIAGNOSIS — E118 Type 2 diabetes mellitus with unspecified complications: Secondary | ICD-10-CM

## 2024-05-09 NOTE — Addendum Note (Signed)
 Addended by: Aileen Alexanders on: 05/09/2024 12:05 PM   Modules accepted: Orders

## 2024-05-09 NOTE — Telephone Encounter (Signed)
 I placed a referral for our pharmacy team to see what other options he has.    I recommend starting the Metformin  while we get this worked out.

## 2024-05-09 NOTE — Telephone Encounter (Signed)
 Please let patient know that his Ozempic  is approved.  He can pick it up at the pharmacy.  At this point, he should also be taking his Metformin .  We can work to wean that down later once his sugars at better controlled.

## 2024-05-09 NOTE — Telephone Encounter (Signed)
 Called and spoke with patient. He states that the Ozempic  is still going to cost him about $200 per month and he cannot afford this.

## 2024-05-09 NOTE — Telephone Encounter (Signed)
 Chief Complaint: hyperglycemic Symptoms: elevated BG Frequency:  last night Pertinent Negatives: Patient denies n/v Disposition: [] ED /[] Urgent Care (no appt availability in office) / [] Appointment(In office/virtual)/ []  [place] [facility]/ [] Home Care/ [] Refused Recommended Disposition /[] [place] [facility]/ [x]  Follow-up with PCP Additional Notes: pt states he normal runs in the 260's but last night it was 365. States that this morning it 232.  States that he doesn't take the metformin  and is waiting on his ozempic  approval. Instructed pt on home care and when to call back. Pt denies any symptoms.   Copied from CRM [id number]. Topic: Clinical - Red Word Triage >> May 09, 2024  8:13 AM Carlatta H wrote: [facility] that prompted transfer to Nurse Triage: Patient is waiting on prior authorization for Ozempic  but is blood sugar is 310// Reason for Disposition  [1] Blood glucose 240 - 300 mg/dL (04.5 - 40.9 mmol/L) AND [2] does not  use insulin  (e.g., not insulin -dependent; most people with type 2 diabetes)  Answer Assessment - Initial Assessment Questions 1. BLOOD GLUCOSE: "What is your blood glucose level?"      232 2. ONSET: "When did you check the blood glucose?"     This morning 3. USUAL RANGE: "What is your glucose level usually?" (e.g., usual fasting morning value, usual evening value)     yes 4. KETONES: "Do you check for ketones (urine or blood test strips)?" If Yes, ask: "What does the test show now?"      no 5. TYPE 1 or 2:  "Do you know what type of diabetes you have?"  (e.g., Type 1, Type 2, Gestational; doesn't know)      Type 2 6. INSULIN : "Do you take insulin ?" "What type of insulin (s) do you use? What is the mode of delivery? (syringe, pen; injection or pump)?"      no 7. DIABETES PILLS: "Do you take any pills for your diabetes?" If Yes, ask: "Have you missed taking any pills recently?"     no 8. OTHER SYMPTOMS: "Do you have any symptoms?" (e.g., fever, frequent  urination, difficulty breathing, dizziness, weakness, vomiting)     no  Protocols used: Diabetes - High Blood Sugar-A-AH

## 2024-05-09 NOTE — Telephone Encounter (Signed)
 Routing to provider to advise. Received approval for Ozempic  this morning.

## 2024-05-09 NOTE — Telephone Encounter (Signed)
 PA has been approved. See other phone encounter.

## 2024-05-10 NOTE — Telephone Encounter (Signed)
Called and LVM notifying patient of Karen's message.

## 2024-05-11 ENCOUNTER — Encounter: Payer: Self-pay | Admitting: Nurse Practitioner

## 2024-05-13 ENCOUNTER — Telehealth: Payer: Self-pay

## 2024-05-13 NOTE — Progress Notes (Signed)
 Care Guide Pharmacy Note  05/13/2024 Name: Johnny Rios MRN: [id number] DOB: 1966/03/19  Referred By: Aileen Alexanders, NP Reason for referral: Complex Care Management (Outreach to schedule with Pharm d )   Johnny Rios is a 58 y.o. year old male who is a primary care patient of Aileen Alexanders, NP.  [name] was referred to the pharmacist for assistance related to: DMII  Successful contact was made with the patient to discuss pharmacy services including being ready for the pharmacist to call at least 5 minutes before the scheduled appointment time and to have medication bottles and any blood pressure readings ready for review. The patient agreed to meet with the pharmacist via telephone visit on (date/time).05/30/2024 [name] , RMA     [place]  [facility], [facility] Guide  Direct Dial: [phone]  Website: [email or web address]

## 2024-05-13 NOTE — Progress Notes (Signed)
 Care Guide Pharmacy Note  05/13/2024 Name: Johnny Rios MRN: [id number] DOB: November 01, 1966  Referred By: Aileen Alexanders, NP Reason for referral: Complex Care Management (Outreach to schedule with Pharm d )   Johnny Rios is a 58 y.o. year old male who is a primary care patient of Aileen Alexanders, NP.  Johnny Rios was referred to the pharmacist for assistance related to: DMII  An unsuccessful telephone outreach was attempted today to contact the patient who was referred to the pharmacy team for assistance with medication assistance. Additional attempts will be made to contact the patient.  Johnny Rios , RMA     [facility]  [facility] : [facility], [facility] Guide  Direct Dial: [phone]  Website: [email or web address]

## 2024-05-28 ENCOUNTER — Telehealth: Payer: Self-pay

## 2024-05-28 NOTE — Telephone Encounter (Signed)
 Copied from CRM [id number]. Topic: Clinical - Medication Question >> May 28, 2024  3:02 PM Marissa P wrote: Reason for CRM: Patient is currently on Semaglutide ,0.25 or 0.5MG /DOS, (OZEMPIC , 0.25 OR 0.5 MG/DOSE,) 2 MG/1.5ML SOPN and wanted to know if its ok [place] some beer [place] the beach please advise

## 2024-05-28 NOTE — Telephone Encounter (Signed)
 Yes it is okay to drink in moderation.

## 2024-05-28 NOTE — Telephone Encounter (Signed)
 Called and notified patient of providers response

## 2024-05-30 ENCOUNTER — Other Ambulatory Visit: Payer: Self-pay

## 2024-05-30 ENCOUNTER — Telehealth: Payer: Self-pay

## 2024-05-30 MED ORDER — TRULICITY 0.75 MG/0.5ML ~~LOC~~ SOAJ: 0.7500 mg | SUBCUTANEOUS | 1 refills | Status: DC

## 2024-05-30 NOTE — Progress Notes (Addendum)
   05/30/2024 Name: CRAWFORD TAMURA MRN: [id number] DOB: 1966-01-14  Chief Complaint  Patient presents with   Diabetes Management Plan   Johnny Rios is a 58 y.o. year old male who presented for a telephone visit.   They were referred to the pharmacist by their PCP for assistance in managing diabetes.    Subjective:  Care Team: Primary Care Provider: Aileen Alexanders, NP ; Next Scheduled Visit: 8/7  Medication Access/Adherence  Current Pharmacy:  [facility] - [place], [place] - [address] [place] [address] Phone: [phone] Fax: [phone]  -Patient reports affordability concerns with their medications: Yes  -Patient reports access/transportation concerns to their pharmacy: No  -Patient reports adherence concerns with their medications:  No    Diabetes: Current medications: Ozempic  0.25mg  weekly Medications tried in the past: metformin  -Current glucose readings: FBG this morning 187 -A1c recently elevated at 99% on 5/7, so PCP started patient on Ozempic   -Patient has had 3 doses of Ozempic  0.25mg  weekly now and is tolerating mostly well, with some stomach upset -Patient states copay for medication was $200, which he cannot continue to pay  Objective:  Lab Results  Component Value Date   HGBA1C 9.9 (H) 05/01/2024   Assessment/Plan:   Diabetes: -Currently uncontrolled -Per insurance 2025 drug formulary, any covered GLP1 is T2 and would have the same copay, but copay card for Ozempic  will only cover $100/month; and copay card for Trulicity would cover at least $150 -Recommend changing patient to Trulicity 0.75mg  weekly, and increasing dose every 4 weeks as tolerated -Prescription pending for PCP to sign if in agreement (copay card billing information included for pharmacy).    Follow Up Plan: Will verify Trulicity copay with pharmacy, notify patient, and schedule follow-up   Linn Rich, PharmD, DPLA

## 2024-05-30 NOTE — Progress Notes (Signed)
   05/30/2024  [name], male   DOB: Apr 27, 1966, 58 y.o.   MRN: [id number]  Trulicity 0.75mg  weekly sent to patient's pharmacy along with manufacturer copay card.  I contacted the pharmacy, and insurance is requiring a prior authorization.  Coordinating with the prior authorization team to initiate a prior authorization request.  Sending a MyChart message to Mr. Ikard to inform him.  Linn Rich, PharmD, DPLA

## 2024-06-03 ENCOUNTER — Telehealth: Payer: Self-pay

## 2024-06-03 ENCOUNTER — Other Ambulatory Visit (HOSPITAL_COMMUNITY): Payer: Self-pay

## 2024-06-03 NOTE — Telephone Encounter (Signed)
 PA request has been Submitted. New Encounter has been or will be created for follow up. For additional info see Pharmacy Prior Auth telephone encounter from 06/03/2024.

## 2024-06-03 NOTE — Telephone Encounter (Signed)
 Pharmacy Patient Advocate Encounter  Received notification from [facility] that Prior Authorization for Trulicity  has been DENIED.  Full denial letter will be uploaded to the media tab. See denial reason below.

## 2024-06-03 NOTE — Telephone Encounter (Signed)
 Pharmacy Patient Advocate Encounter   Received notification from Physician's Office that prior authorization for Trulicity  0.75MG /0.5ML auto-injectors is required/requested.   Insurance verification completed.   The patient is insured through [facility] .   Per test claim: PA required; PA submitted to above mentioned insurance via CoverMyMeds Key/confirmation #/EOC [id number] Status is pending  PA SENT BEFORE LAB NOTES WITH RECENT A1C SENT, SUBMITTED LAB NOTES VIA FAX TO [facility] DEPT. AT [phone]

## 2024-06-04 ENCOUNTER — Other Ambulatory Visit (HOSPITAL_COMMUNITY): Payer: Self-pay

## 2024-06-05 ENCOUNTER — Telehealth: Payer: Self-pay

## 2024-06-05 NOTE — Progress Notes (Signed)
   06/05/2024  [name], male   DOB: 05/27/1966, 58 y.o.   MRN: [id number]  In basket message from patient stating he prefers to stay on Ozempic  as long as copay can be reduced to $100/month.  Obtained manufacturer copay card and provided to [facility]: [facility] [address] [address] [id number] Id [phone]  Medication is too soon to be refilled at this time, but the card information has been stored on patient's profile per the pharmacy.  Responding to patient's message to make him aware.  Linn Rich, PharmD, DPLA

## 2024-06-17 ENCOUNTER — Telehealth: Payer: Self-pay

## 2024-06-17 ENCOUNTER — Other Ambulatory Visit (HOSPITAL_COMMUNITY): Payer: Self-pay

## 2024-06-17 NOTE — Progress Notes (Signed)
   06/17/2024  [name], male   DOB: March 05, 1966, 58 y.o.   MRN: [id number]  In basket message from patient stating he is down to 1 dose of Ozempic  0.5mg  remaining and is inquiring about cost with copay card.  Card previously provided to pharmacy, but medication was too soon to fill at that time.  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  Contacted member of the prior authorization team to see if we could get a paid claim on both insurance and the copay card.  Primary insurance is coming back as a International aid/development worker, which will not allow use of copay card.  Patient was advised to contact his insurance company by [employer], and I will contact support for the Ozempic  copay card to see if any overrides are available.  Johnny Rios, PharmD, DPLA

## 2024-06-18 ENCOUNTER — Telehealth: Payer: Self-pay

## 2024-06-18 ENCOUNTER — Other Ambulatory Visit (HOSPITAL_COMMUNITY): Payer: Self-pay

## 2024-06-18 NOTE — Progress Notes (Signed)
   06/18/2024  [name], male   DOB: [birth year], 58 y.o.   MRN: [id number]  [facility] was able to processing Ozempic  on patient's insurance and manufacturer copay card when billing as a 28 day supply; however, the pharmacy would be losing money on the claim; so they are unable to fill the medication.  I tried to call the patient to see if there is another pharmacy he would like the medication sent to.  I was not able to reach him, so I left a HIPAA compliant voicemail with my direct phone number, and have sent a MyChart message.  Johnny Rios, PharmD, DPLA

## 2024-06-19 ENCOUNTER — Other Ambulatory Visit (HOSPITAL_COMMUNITY): Payer: Self-pay

## 2024-06-19 ENCOUNTER — Other Ambulatory Visit: Payer: Self-pay

## 2024-06-19 ENCOUNTER — Telehealth: Payer: Self-pay

## 2024-06-19 MED ORDER — OZEMPIC (0.25 OR 0.5 MG/DOSE) 2 MG/3ML ~~LOC~~ SOPN
0.5000 mg | PEN_INJECTOR | SUBCUTANEOUS | 1 refills | Status: DC
  Filled 2024-06-19 (×2): qty 3, 28d supply, fill #0

## 2024-06-19 NOTE — Progress Notes (Signed)
   06/19/2024  [name], male   DOB: 05/26/1966, 57 y.o.   MRN: [id number]  Patient has already contacted [facility] to pay $24.99 copay for Ozempic , and medication will be delivered.  He will complete 4 additional weeks at 0.5mg  dose; and we follow-up 7/23 to discuss potentially increasing to 1mg  weekly based on tolerance/efficacy.  Johnny Rios, PharmD, DPLA

## 2024-06-19 NOTE — Progress Notes (Addendum)
   06/19/2024  [name], male   DOB: 1966-10-22, 58 y.o.   MRN: [id number]  Subjective/Objective Incoming call from patient to further discuss Ozempic  refill  Diabetes Management Plan Current medications:  Ozempic  0.5mg  weekly -Patient has completed 4 weeks of Ozempic  0.25 and 2 weeks of Ozempic  0.5mg ; he endorses tolerating the medication well with no adverse side effects -[facility] unable to refill medication on patient's insurance along with manfaucturer copay card, so patient elects to have medication refill sent to [facility] for processing and home delivery -A1c 5/7 elevated at 9.9% -Patient does monitor home FBG and states this has started to come down some; originally >200 and reading this morning was 187  Assessment/Plan  Diabetes Management Plan -Ozempic  0.5mg  weekly sent to [facility], and pharmacy states copay will be $24.99.  Patient will need to contact pharmacy to provide payment information; and medication should be delivered by Friday. -Due for next dose Monday -I recommend 4 more weeks at 0.5mg  weekly, then increasing to 1mg  weekly if patient continues to tolerate medication well -Sees PCP again 8/7 and will be due for A1c  Follow-up:  This afternoon to discuss Ozempic  plan  Johnny Rios, PharmD, DPLA

## 2024-06-29 ENCOUNTER — Emergency Department
Admission: EM | Admit: 2024-06-29 | Discharge: 2024-06-29 | Disposition: A | Discharge status: Home / Self Care | Attending: Emergency Medicine | Admitting: Emergency Medicine

## 2024-06-29 DIAGNOSIS — R110 Nausea: Secondary | ICD-10-CM

## 2024-06-29 DIAGNOSIS — E119 Type 2 diabetes mellitus without complications: Secondary | ICD-10-CM | POA: Insufficient documentation

## 2024-06-29 DIAGNOSIS — I10 Essential (primary) hypertension: Secondary | ICD-10-CM | POA: Insufficient documentation

## 2024-06-29 LAB — TROPONIN I (HIGH SENSITIVITY): Troponin I (High Sensitivity): 3 ng/L (ref <18)

## 2024-06-29 LAB — CBC
HCT: 41.1 % (ref 39.0–52.0)
Hemoglobin: 13.9 g/dL (ref 13.0–17.0)
MCH: 26.5 pg (ref 26.0–34.0)
MCHC: 33.8 g/dL (ref 30.0–36.0)
MCV: 78.3 fL — ABNORMAL LOW (ref 80.0–100.0)
Platelets: 324 K/uL (ref 150–400)
RBC: 5.25 MIL/uL (ref 4.22–5.81)
RDW: 12.2 % (ref 11.5–15.5)
WBC: 9.2 K/uL (ref 4.0–10.5)
nRBC: 0 % (ref 0.0–0.2)

## 2024-06-29 LAB — COMPREHENSIVE METABOLIC PANEL WITH GFR
ALT: 41 U/L (ref 0–44)
AST: 30 U/L (ref 15–41)
Albumin: 4 g/dL (ref 3.5–5.0)
Alkaline Phosphatase: 74 U/L (ref 38–126)
Anion gap: 7 (ref 5–15)
BUN: 11 mg/dL (ref 6–20)
CO2: 26 mmol/L (ref 22–32)
Calcium: 9.7 mg/dL (ref 8.9–10.3)
Chloride: 104 mmol/L (ref 98–111)
Creatinine, Ser: 0.89 mg/dL (ref 0.61–1.24)
GFR, Estimated: >60 mL/min (ref >60)
Glucose, Bld: 180 mg/dL — ABNORMAL HIGH (ref 70–99)
Potassium: 4.1 mmol/L (ref 3.5–5.1)
Sodium: 137 mmol/L (ref 135–145)
Total Bilirubin: 0.7 mg/dL (ref 0.0–1.2)
Total Protein: 6.9 g/dL (ref 6.5–8.1)

## 2024-06-29 NOTE — ED Triage Notes (Signed)
 Pt c/o intermittent dizziness, nausea, and elevated BP x1 day.  Pt denies cardiac history.  Pt reports BPs in 140s and 150s at home.  Hx of DM.  Pt reports taking Ozempic  x 1.43months.  Denies pain.

## 2024-06-29 NOTE — ED Provider Notes (Signed)
 [facility] Provider Note    Event Date/Time   First MD Initiated Contact with Patient 06/29/24 1656     (approximate)   History   Hypertension and Nausea   HPI Johnny Rios is a 58 y.o. male with history of DM2, HLD, anxiety presenting today for hypertension.  Patient states for the past 2 days he has had intermittent nausea and lightheadedness.  He took his blood pressure today and it was elevated at 150 systolic.  Otherwise denies chest pain, shortness of breath, cough, congestion, vomiting, abdominal pain, diarrhea, constipation.  States he has been on Ozempic  for 1.5 months with a recent dosage increased 2 weeks ago.  Also has reflux symptoms that feel similar.  Denies any other complaints today.     Physical Exam   Triage Vital Signs: ED Triage Vitals  Encounter Vitals Group     BP 06/29/24 1610 (!) 147/111     Girls Systolic BP Percentile --      Girls Diastolic BP Percentile --      Boys Systolic BP Percentile --      Boys Diastolic BP Percentile --      Pulse Rate 06/29/24 1610 97     Resp 06/29/24 1610 18     Temp 06/29/24 1610 98.4 F (36.9 C)     Temp Source 06/29/24 1610 Oral     SpO2 06/29/24 1610 99 %     Weight 06/29/24 1611 274 lb (124.3 kg)     Height 06/29/24 1611 5' 9 (1.753 m)     Head Circumference --      Peak Flow --      Pain Score 06/29/24 1611 0     Pain Loc --      Pain Education --      Exclude from Growth Chart --     Most recent vital signs: Vitals:   06/29/24 1610 06/29/24 1658  BP: (!) 147/111   Pulse: 97   Resp: 18   Temp: 98.4 F (36.9 C)   SpO2: 99% 99%   I have reviewed the vital signs. General:  Awake, alert, no acute distress. Head:  Normocephalic, Atraumatic. EENT:  PERRL, EOMI, Oral mucosa pink and moist, Neck is supple. Cardiovascular: Regular rate, 2+ distal pulses. Respiratory:  Normal respiratory effort, symmetrical expansion, no distress.   Extremities:  Moving all four extremities  through full ROM without pain.   Neuro:  Alert and oriented.  Interacting appropriately.   Skin:  Warm, dry, no rash.   Psych: Appropriate affect.    ED Results / Procedures / Treatments   Labs (all labs ordered are listed, but only abnormal results are displayed) Labs Reviewed  COMPREHENSIVE METABOLIC PANEL WITH GFR - Abnormal; Notable for the following components:      Result Value   Glucose, Bld 180 (*)    All other components within normal limits  CBC - Abnormal; Notable for the following components:   MCV 78.3 (*)    All other components within normal limits  TROPONIN I (HIGH SENSITIVITY)     EKG My EKG interpretation: Rate of 102, sinus tachycardia, normal axis, normal intervals.  No acute ST elevations or depressions   RADIOLOGY    PROCEDURES:  Critical Care performed: No  Procedures   MEDICATIONS ORDERED IN ED: Medications - No data to display   IMPRESSION / MDM / ASSESSMENT AND PLAN / ED COURSE  I reviewed the triage vital signs and the nursing notes.  Differential diagnosis includes, but is not limited to, medication side effect, reflux, anxiety, hypertension  Patient's presentation is most consistent with acute complicated illness / injury requiring diagnostic workup.  Patient is a 58 year old male presenting today for nausea and hypertension.  Currently asymptomatic.  Physical exam entirely unremarkable.  Blood pressure is 147/111 but otherwise vital signs are stable.  EKG with no concerning findings.  CBC, CMP, and troponin all unremarkable.  Patient did have recent increase in his Ozempic  2 weeks ago which could be contributing to his symptoms but he also states he felt better after taking his reflux medication this morning.  No prior history of elevated blood pressure and does state he has a lot of anxiety which he thinks is causing that to be high.  At this time, I do not see any immediate life-threatening pathology.  I do  feel he is safe to go home and recommended adding on as needed Tums for reflux symptoms.  Recommend he check his blood pressure twice daily and follow-up with his PCP for reassessment.  Agreeable with plan and given strict return precautions.   FINAL CLINICAL IMPRESSION(S) / ED DIAGNOSES   Final diagnoses:  Nausea  Hypertension, unspecified type     Rx / DC Orders   ED Discharge Orders     None        Note:  This document was prepared using Dragon voice recognition software and may include unintentional dictation errors.   Malvina Alm DASEN, MD 06/29/24 [id number]

## 2024-06-29 NOTE — Discharge Instructions (Signed)
 You can add on over-the-counter Tums as needed to help with any of your nausea symptoms and possible reflux.  Please check your blood pressure twice daily which is once in the morning and once in evening.  Follow-up with your primary care provider to rediscuss your symptoms if they are continuing or any worsening high blood pressure for which you may need to be started on medication.  Return for any severe worsening symptoms such as sharp chest pains, sharp abdominal pains, or multiple episodes of vomiting.

## 2024-07-01 ENCOUNTER — Other Ambulatory Visit: Payer: Self-pay | Admitting: Physician Assistant

## 2024-07-03 NOTE — Telephone Encounter (Signed)
 Medication no longer listed on current medication list Requested Prescriptions  Pending Prescriptions Disp Refills   atorvastatin  (LIPITOR) 20 MG tablet [Pharmacy Med Name: ATORVASTATIN  CALCIUM  20MG  TABLET] 150 tablet 0    Sig: TAKE ONE TABLET BY MOUTH DAILY     Cardiovascular:  Antilipid - Statins Failed - 07/03/2024 12:04 PM      Failed - Lipid Panel in normal range within the last 12 months    Cholesterol, Total  Date Value Ref Range Status  10/31/2023 147 100 - 199 mg/dL Final   LDL Chol Calc (NIH)  Date Value Ref Range Status  10/31/2023 91 0 - 99 mg/dL Final   HDL  Date Value Ref Range Status  10/31/2023 27 (L) >39 mg/dL Final   Triglycerides  Date Value Ref Range Status  10/31/2023 162 (H) 0 - 149 mg/dL Final         Passed - Patient is not pregnant      Passed - Valid encounter within last 12 months    Recent Outpatient Visits           2 months ago Diabetes mellitus treated with injections of non-insulin  medication (HCC)   [place] [facility] Melvin Pao, NP   3 months ago OSA on CPAP   [place] [facility] Melvin Pao, NP   5 months ago Anxiety   [place] [facility] Melvin Pao, NP       Future Appointments             In 4 months Hester Alm BROCKS, MD [facility]

## 2024-07-05 NOTE — Telephone Encounter (Signed)
 Spoke with patient. Reports that there is some blood on toilet paper when he wipes. Denies any blood visible in toilet or stool, reports stool appears normal in appearance. Does state he had diarrhea on and off last couple weeks since starting ozempic , along with constipation and straining. No longer wants to take the ozempic  Was due for a dose on 7/7 and did not administer.   Counseled to contact office directly for direct follow up if concerns continue and counseled on ER precautions. Routing to [place] to follow up with patient on ozempic  upon her return.

## 2024-07-12 ENCOUNTER — Other Ambulatory Visit: Payer: Self-pay | Admitting: Nurse Practitioner

## 2024-07-12 DIAGNOSIS — E785 Hyperlipidemia, unspecified: Secondary | ICD-10-CM

## 2024-07-12 DIAGNOSIS — E782 Mixed hyperlipidemia: Secondary | ICD-10-CM

## 2024-07-15 NOTE — Telephone Encounter (Signed)
 Requested Prescriptions  Pending Prescriptions Disp Refills   fenofibrate  160 MG tablet [Pharmacy Med Name: FENOFIBRATE  160MG  TABLET] 90 tablet 0    Sig: TAKE ONE TABLET (160 MG TOTAL) BY MOUTH DAILY.     Cardiovascular:  Antilipid - Fibric Acid Derivatives Failed - 07/15/2024  3:38 PM      Failed - Lipid Panel in normal range within the last 12 months    Cholesterol, Total  Date Value Ref Range Status  10/31/2023 147 100 - 199 mg/dL Final   LDL Chol Calc (NIH)  Date Value Ref Range Status  10/31/2023 91 0 - 99 mg/dL Final   HDL  Date Value Ref Range Status  10/31/2023 27 (L) >39 mg/dL Final   Triglycerides  Date Value Ref Range Status  10/31/2023 162 (H) 0 - 149 mg/dL Final         Passed - ALT in normal range and within 360 days    ALT  Date Value Ref Range Status  06/29/2024 41 0 - 44 U/L Final         Passed - AST in normal range and within 360 days    AST  Date Value Ref Range Status  06/29/2024 30 15 - 41 U/L Final         Passed - Cr in normal range and within 360 days    Creatinine  Date Value Ref Range Status  10/31/2023 87.4 20.0 - 300.0 mg/dL Final   Creatinine, Ser  Date Value Ref Range Status  06/29/2024 0.89 0.61 - 1.24 mg/dL Final         Passed - HGB in normal range and within 360 days    Hemoglobin  Date Value Ref Range Status  06/29/2024 13.9 13.0 - 17.0 g/dL Final  96/74/7974 86.7 13.0 - 17.7 g/dL Final         Passed - HCT in normal range and within 360 days    HCT  Date Value Ref Range Status  06/29/2024 41.1 39.0 - 52.0 % Final   Hematocrit  Date Value Ref Range Status  03/19/2024 41.5 37.5 - 51.0 % Final         Passed - PLT in normal range and within 360 days    Platelets  Date Value Ref Range Status  06/29/2024 324 150 - 400 K/uL Final  03/19/2024 312 150 - 450 x10E3/uL Final         Passed - WBC in normal range and within 360 days    WBC  Date Value Ref Range Status  06/29/2024 9.2 4.0 - 10.5 K/uL Final          Passed - eGFR is 30 or above and within 360 days    GFR calc Af Amer  Date Value Ref Range Status  10/01/2020 106 >59 mL/min/1.73 Final    Comment:    **[facility] currently reports eGFR in compliance with the current**   recommendations of the [employer]. [facility] will   update reporting as new guidelines are published from the NKF-ASN   Task force.    GFR, Estimated  Date Value Ref Range Status  06/29/2024 >60 >60 mL/min Final    Comment:    (NOTE) Calculated using the CKD-EPI Creatinine Equation (2021)    eGFR  Date Value Ref Range Status  05/01/2024 94 >59 mL/min/1.73 Final         Passed - Valid encounter within last 12 months    Recent Outpatient Visits  2 months ago Diabetes mellitus treated with injections of non-insulin  medication [facility])   [place] [facility] Melvin Pao, NP   4 months ago OSA on CPAP   [place] [facility] Melvin Pao, NP   5 months ago Anxiety   [place] [facility] Melvin Pao, NP       Future Appointments             In 3 months Hester Alm BROCKS, MD [place] [facility]             atorvastatin  (LIPITOR) 20 MG tablet [Pharmacy Med Name: ATORVASTATIN  CALCIUM  20MG  TABLET] 90 tablet 1    Sig: TAKE ONE TABLET (20 MG TOTAL) BY MOUTH DAILY.     Cardiovascular:  Antilipid - Statins Failed - 07/15/2024  3:38 PM      Failed - Lipid Panel in normal range within the last 12 months    Cholesterol, Total  Date Value Ref Range Status  10/31/2023 147 100 - 199 mg/dL Final   LDL Chol Calc (NIH)  Date Value Ref Range Status  10/31/2023 91 0 - 99 mg/dL Final   HDL  Date Value Ref Range Status  10/31/2023 27 (L) >39 mg/dL Final   Triglycerides  Date Value Ref Range Status  10/31/2023 162 (H) 0 - 149 mg/dL Final         Passed - Patient is not pregnant      Passed - Valid encounter within last 12 months    Recent Outpatient Visits            2 months ago Diabetes mellitus treated with injections of non-insulin  medication (HCC)   [place] [facility] Melvin Pao, NP   4 months ago OSA on CPAP   [place] [facility] Melvin Pao, NP   5 months ago Anxiety   [place] [facility] Melvin Pao, NP       Future Appointments             In 3 months Hester Alm BROCKS, MD [facility]

## 2024-07-16 ENCOUNTER — Other Ambulatory Visit: Payer: Self-pay

## 2024-07-16 NOTE — Progress Notes (Signed)
   07/16/2024  [name], male   DOB: 1966-11-20, 58 y.o.   MRN: [id number]  Subjective/Objective Patient outreach to follow-up on management of diabetes    Diabetes Management Plan Current medications:  metformin  500mg  BID -Patient was on Ozempic  for 6-8 weeks and had initially tolerated well but presented to ED approximately 2 weeks ago with nausea, lightheadedness, and elevated BP.  After this he stopped the Ozempic  and resumed metformin  500mg  BID with meals -Endorses good adherence to metformin  since resuming; he is also tolerating well with no stomach upset -A1c 5/7 elevated at 9.9% -Patient does monitor home FBG and states this has started to come down some since resuming metformin  500mg  BIG- reading this morning of 164  Hypertension -Current medications:  none -Patient has been monitoring home BP and endorses this is averaging 130/80   Assessment/Plan   Diabetes Management Plan -Continue current regimen at this time -Monitor and record home FBG and bring readings to upcoming PCP visit 8/7 -Due for A1c; if >7%, consider changing metformin  to XR formulation and titrating dose by 500mg  weekly until at 2000mg  daily dose -Could also consider trial of Rybelsus  3mg  daily, Farxiga 5mg  or Jardiance 10mg  daily   Hypertension -Continue to monitor home BP and record values; bring these to upcoming PCP visit 8/7 -If consistently >130/80, I recommend addition of ACE-I or ARB   Herbie Lehrmann A Elizabet Schweppe, PharmD, DPLA

## 2024-07-17 ENCOUNTER — Other Ambulatory Visit

## 2024-08-01 ENCOUNTER — Encounter: Payer: Self-pay | Admitting: Nurse Practitioner

## 2024-08-01 ENCOUNTER — Ambulatory Visit: Admitting: Nurse Practitioner

## 2024-08-01 VITALS — BP 131/88 | HR 89 | Temp 97.5°F | Ht 69.0 in | Wt 274.2 lb

## 2024-08-01 DIAGNOSIS — E559 Vitamin D deficiency, unspecified: Secondary | ICD-10-CM

## 2024-08-01 DIAGNOSIS — G8929 Other chronic pain: Secondary | ICD-10-CM

## 2024-08-01 DIAGNOSIS — I152 Hypertension secondary to endocrine disorders: Secondary | ICD-10-CM

## 2024-08-01 DIAGNOSIS — E119 Type 2 diabetes mellitus without complications: Secondary | ICD-10-CM | POA: Diagnosis not present

## 2024-08-01 DIAGNOSIS — E1159 Type 2 diabetes mellitus with other circulatory complications: Secondary | ICD-10-CM | POA: Insufficient documentation

## 2024-08-01 DIAGNOSIS — Z7985 Long-term (current) use of injectable non-insulin antidiabetic drugs: Secondary | ICD-10-CM

## 2024-08-01 DIAGNOSIS — F419 Anxiety disorder, unspecified: Secondary | ICD-10-CM | POA: Diagnosis not present

## 2024-08-01 DIAGNOSIS — M545 Low back pain, unspecified: Secondary | ICD-10-CM

## 2024-08-01 LAB — MICROALBUMIN, URINE WAIVED
Creatinine, Urine Waived: 200 mg/dL (ref 10–300)
Microalb, Ur Waived: 30 mg/L — ABNORMAL HIGH (ref 0–19)
Microalb/Creat Ratio: <30 mg/g (ref <30)

## 2024-08-01 MED ORDER — CYCLOBENZAPRINE HCL 5 MG PO TABS: 5.0000 mg | ORAL_TABLET | Freq: Three times a day (TID) | ORAL | 1 refills | Status: AC | PRN

## 2024-08-01 MED ORDER — CLONAZEPAM 1 MG PO TABS
1.0000 mg | ORAL_TABLET | Freq: Every day | ORAL | 2 refills | Status: DC | PRN
Start: 2024-08-01 — End: 2024-10-28

## 2024-08-01 MED ORDER — LISINOPRIL 10 MG PO TABS: 10.0000 mg | ORAL_TABLET | Freq: Every day | ORAL | 1 refills | Status: DC

## 2024-08-01 MED ORDER — ONDANSETRON HCL 4 MG PO TABS: 4.0000 mg | ORAL_TABLET | Freq: Three times a day (TID) | ORAL | 0 refills | Status: AC | PRN

## 2024-08-01 NOTE — Progress Notes (Signed)
 BP 131/88   Pulse 89   Temp (!) 97.5 F (36.4 C) (Oral)   Ht 5' 9 (1.753 m)   Wt 274 lb 3.2 oz (124.4 kg)   SpO2 96%   BMI 40.49 kg/m    Subjective:    [name], male    DOB: [birth year], 58 y.o.   MRN: [id number]  HPI: Johnny Rios is a 58 y.o. male  Chief Complaint  Patient presents with   Diabetes   Hyperlipidemia   Back Pain   ANXIETY Doing well with Klonopin .  Takes it daily.  Denies concerns at visit today.  Aware of the risks of taking it daily.  He has had increase anxiety recently.       DIABETES Last A1c was 9.9%.  Patient states he was taking Ozempic  but no longer taking the medication.  Patient states he was light headed, dizzy, and increased anxiety.  He is only taking Metformin  PRN.  Hypoglycemic episodes:no Polydipsia/polyuria: no Visual disturbance: no Chest pain: no Paresthesias: no Glucose Monitoring: no  Accucheck frequency: Not Checking  Fasting glucose:  Post prandial:  Evening:  Before meals: Taking Insulin ?: no  Long acting insulin :  Short acting insulin :  Patient states he is having ongoing back pain.  Stands on a concrete floor all day.     Relevant past medical, surgical, family and social history reviewed and updated as indicated. Interim medical history since our last visit reviewed. Allergies and medications reviewed and updated.  Review of Systems  Eyes:  Negative for visual disturbance.  Respiratory:  Negative for chest tightness and shortness of breath.   Cardiovascular:  Negative for chest pain, palpitations and leg swelling.  Endocrine: Negative for polydipsia and polyuria.  Musculoskeletal:  Positive for back pain.  Neurological:  Negative for dizziness, light-headedness, numbness and headaches.  Psychiatric/Behavioral:  The patient is nervous/anxious.     Per HPI unless specifically indicated above     Objective:    BP 131/88   Pulse 89   Temp (!) 97.5 F (36.4 C) (Oral)   Ht 5' 9  (1.753 m)   Wt 274 lb 3.2 oz (124.4 kg)   SpO2 96%   BMI 40.49 kg/m   Wt Readings from Last 3 Encounters:  08/01/24 274 lb 3.2 oz (124.4 kg)  06/29/24 274 lb (124.3 kg)  05/01/24 274 lb 6.4 oz (124.5 kg)    Physical Exam Vitals and nursing note reviewed.  Constitutional:      General: He is not in acute distress.    Appearance: Normal appearance. He is not ill-appearing, toxic-appearing or diaphoretic.  HENT:     Head: Normocephalic.     Right Ear: External ear normal.     Left Ear: External ear normal.     Nose: Nose normal. No congestion or rhinorrhea.     Mouth/Throat:     Mouth: Mucous membranes are moist.  Eyes:     General:        Right eye: No discharge.        Left eye: No discharge.     Extraocular Movements: Extraocular movements intact.     Conjunctiva/sclera: Conjunctivae normal.     Pupils: Pupils are equal, round, and reactive to light.  Cardiovascular:     Rate and Rhythm: Normal rate and regular rhythm.     Heart sounds: No murmur heard. Pulmonary:     Effort: Pulmonary effort is normal. No respiratory distress.     Breath sounds:  Normal breath sounds. No wheezing, rhonchi or rales.  Abdominal:     General: Abdomen is flat. Bowel sounds are normal.  Musculoskeletal:     Cervical back: Normal range of motion and neck supple.  Skin:    General: Skin is warm and dry.     Capillary Refill: Capillary refill takes less than 2 seconds.  Neurological:     General: No focal deficit present.     Mental Status: He is alert and oriented to person, place, and time.  Psychiatric:        Mood and Affect: Mood normal.        Behavior: Behavior normal.        Thought Content: Thought content normal.        Judgment: Judgment normal.     Results for orders placed or performed during the hospital encounter of 06/29/24  Comprehensive metabolic panel   Collection Time: 06/29/24  4:15 PM  Result Value Ref Range   Sodium 137 135 - 145 mmol/L   Potassium 4.1 3.5 -  5.1 mmol/L   Chloride 104 98 - 111 mmol/L   CO2 26 22 - 32 mmol/L   Glucose, Bld 180 (H) 70 - 99 mg/dL   BUN 11 6 - 20 mg/dL   Creatinine, Ser 9.10 0.61 - 1.24 mg/dL   Calcium  9.7 8.9 - 10.3 mg/dL   Total Protein 6.9 6.5 - 8.1 g/dL   Albumin 4.0 3.5 - 5.0 g/dL   AST 30 15 - 41 U/L   ALT 41 0 - 44 U/L   Alkaline Phosphatase 74 38 - 126 U/L   Total Bilirubin 0.7 0.0 - 1.2 mg/dL   GFR, Estimated >39 >39 mL/min   Anion gap 7 5 - 15  CBC   Collection Time: 06/29/24  4:15 PM  Result Value Ref Range   WBC 9.2 4.0 - 10.5 K/uL   RBC 5.25 4.22 - 5.81 MIL/uL   Hemoglobin 13.9 13.0 - 17.0 g/dL   HCT 58.8 60.9 - 47.9 %   MCV 78.3 (L) 80.0 - 100.0 fL   MCH 26.5 26.0 - 34.0 pg   MCHC 33.8 30.0 - 36.0 g/dL   RDW 87.7 88.4 - 84.4 %   Platelets 324 150 - 400 K/uL   nRBC 0.0 0.0 - 0.2 %  Troponin I (High Sensitivity)   Collection Time: 06/29/24  4:15 PM  Result Value Ref Range   Troponin I (High Sensitivity) 3 <18 ng/L      Assessment & Plan:   Problem List Items Addressed This Visit       Cardiovascular and Mediastinum   Hypertension associated with diabetes (HCC)   Chronic. Not well controlled.  Will start Lisinopril  10mg .  Side effects and benefits of medication discussed during visit.  Follow up in 3 months.  Call sooner if concerns arise.      Relevant Medications   lisinopril  (ZESTRIL ) 10 MG tablet     Endocrine   Diabetes mellitus treated with injections of non-insulin  medication (HCC) - Primary   Chronic. Not well controlled.  Recommend taking Ozempic  or Metformin . Does not want to restart Ozempic .  Will await A1c results and discuss options once results are back.  No up to date on Eye exam.  Declined pneumonia shot.       Relevant Medications   lisinopril  (ZESTRIL ) 10 MG tablet   Other Relevant Orders   Comp Met (CMET)   HgB A1c   Microalbumin, Urine Waived   Microalbumin,  Urine Waived     Other   Anxiety   Chronic.  Controlled.  Continue with current medication  regimen of Clonazepam  daily.  Controlled substance agreement and UDS up to date.  Understands risks of taking Benzodiazepine and okay with continuing. Labs ordered today.  Return to clinic in 3 months for reevaluation.  Call sooner if concerns arise.       Relevant Medications   clonazePAM  (KLONOPIN ) 1 MG tablet   Other Visit Diagnoses       Vitamin D  deficiency       Labs ordered at visit today.   Relevant Orders   Vitamin D  (25 hydroxy)     Chronic low back pain without sciatica, unspecified back pain laterality       Will treat with cyclobenzaprine .   Relevant Medications   cyclobenzaprine  (FLEXERIL ) 5 MG tablet   clonazePAM  (KLONOPIN ) 1 MG tablet         Follow up plan: Return in about 3 months (around 11/01/2024) for HTN, HLD, DM2 FU.

## 2024-08-01 NOTE — Assessment & Plan Note (Signed)
Chronic.  Controlled.  Continue with current medication regimen of Clonazepam daily.  Controlled substance agreement and UDS up to date.  Understands risks of taking Benzodiazepine and okay with continuing. Labs ordered today.  Return to clinic in 3 months for reevaluation.  Call sooner if concerns arise.   

## 2024-08-01 NOTE — Assessment & Plan Note (Signed)
 Chronic. Not well controlled.  Recommend taking Ozempic  or Metformin . Does not want to restart Ozempic .  Will await A1c results and discuss options once results are back.  No up to date on Eye exam.  Declined pneumonia shot.

## 2024-08-01 NOTE — Assessment & Plan Note (Signed)
 Chronic. Not well controlled.  Will start Lisinopril  10mg .  Side effects and benefits of medication discussed during visit.  Follow up in 3 months.  Call sooner if concerns arise.

## 2024-08-02 ENCOUNTER — Ambulatory Visit: Payer: Self-pay | Admitting: Nurse Practitioner

## 2024-08-02 LAB — COMPREHENSIVE METABOLIC PANEL WITH GFR
ALT: 33 IU/L (ref 0–44)
AST: 22 IU/L (ref 0–40)
Albumin: 4.4 g/dL (ref 3.8–4.9)
Alkaline Phosphatase: 109 IU/L (ref 44–121)
BUN/Creatinine Ratio: 14 (ref 9–20)
BUN: 12 mg/dL (ref 6–24)
Bilirubin Total: 0.4 mg/dL (ref 0.0–1.2)
CO2: 22 mmol/L (ref 20–29)
Calcium: 9.7 mg/dL (ref 8.7–10.2)
Chloride: 100 mmol/L (ref 96–106)
Creatinine, Ser: 0.84 mg/dL (ref 0.76–1.27)
Globulin, Total: 2.4 g/dL (ref 1.5–4.5)
Glucose: 189 mg/dL — ABNORMAL HIGH (ref 70–99)
Potassium: 4.4 mmol/L (ref 3.5–5.2)
Sodium: 138 mmol/L (ref 134–144)
Total Protein: 6.8 g/dL (ref 6.0–8.5)
eGFR: 102 mL/min/1.73 (ref >59)

## 2024-08-02 LAB — VITAMIN D 25 HYDROXY (VIT D DEFICIENCY, FRACTURES): Vit D, 25-Hydroxy: 23.1 ng/mL — ABNORMAL LOW (ref 30.0–100.0)

## 2024-08-02 LAB — HEMOGLOBIN A1C
Est. average glucose Bld gHb Est-mCnc: 206 mg/dL
Hgb A1c MFr Bld: 8.8 % — ABNORMAL HIGH (ref 4.8–5.6)

## 2024-08-02 MED ORDER — VITAMIN D (ERGOCALCIFEROL) 1.25 MG (50000 UNIT) PO CAPS: 50000.0000 [IU] | ORAL_CAPSULE | ORAL | 0 refills | Status: DC

## 2024-08-02 NOTE — Addendum Note (Signed)
 Addended by: MELVIN PAO on: 08/02/2024 08:38 AM   Modules accepted: Orders

## 2024-08-30 ENCOUNTER — Telehealth: Payer: Self-pay

## 2024-08-30 NOTE — Progress Notes (Signed)
   08/30/2024  [name], male   DOB: 1966-09-02, 58 y.o.   MRN: [id number]  Missed call and voicemail from patient requesting a call back.  I attempted to contact the patient but was not able to reach him.  Message was left with my direct phone number, so he can return my call.  Johnny Rios, PharmD, DPLA

## 2024-09-04 ENCOUNTER — Other Ambulatory Visit: Payer: Self-pay

## 2024-09-04 NOTE — Progress Notes (Unsigned)
   09/04/2024  [name], male   DOB: 11-30-1966, 58 y.o.   MRN: [id number]  Unsuccessful outreach attempt to follow-up on management of diabetes and hypertension.  I was able to leave HIPAA compliant voicemail with my direct phone number, and I am sending a MyChart message to see if we can reschedule to a better time for the patient.  [name], PharmD, DPLA

## 2024-09-05 MED ORDER — METFORMIN HCL 500 MG PO TABS: 500.0000 mg | ORAL_TABLET | Freq: Two times a day (BID) | ORAL | 1 refills | Status: DC

## 2024-09-05 NOTE — Progress Notes (Signed)
   09/05/2024  [name], male   DOB: 11/06/1966, 58 y.o.   MRN: [id number]  Received MyChart message from patient stating he has not been taking metformin , because prescription was not sent to the pharmacy.  Order pending for metformin  500mg  BID.  [name], PharmD, DPLA

## 2024-10-23 ENCOUNTER — Other Ambulatory Visit: Payer: Self-pay | Admitting: Nurse Practitioner

## 2024-10-23 DIAGNOSIS — E782 Mixed hyperlipidemia: Secondary | ICD-10-CM

## 2024-10-24 NOTE — Telephone Encounter (Signed)
 Requested Prescriptions  Pending Prescriptions Disp Refills   fenofibrate  160 MG tablet [Pharmacy Med Name: FENOFIBRATE  160MG  TABLET] 90 tablet 0    Sig: TAKE ONE TABLET (160 MG TOTAL) BY MOUTH DAILY.     Cardiovascular:  Antilipid - Fibric Acid Derivatives Failed - 10/24/2024  4:16 PM      Failed - Lipid Panel in normal range within the last 12 months    Cholesterol, Total  Date Value Ref Range Status  10/31/2023 147 100 - 199 mg/dL Final   LDL Chol Calc (NIH)  Date Value Ref Range Status  10/31/2023 91 0 - 99 mg/dL Final   HDL  Date Value Ref Range Status  10/31/2023 27 (L) >39 mg/dL Final   Triglycerides  Date Value Ref Range Status  10/31/2023 162 (H) 0 - 149 mg/dL Final         Passed - ALT in normal range and within 360 days    ALT  Date Value Ref Range Status  08/01/2024 33 0 - 44 IU/L Final         Passed - AST in normal range and within 360 days    AST  Date Value Ref Range Status  08/01/2024 22 0 - 40 IU/L Final         Passed - Cr in normal range and within 360 days    Creatinine  Date Value Ref Range Status  10/31/2023 87.4 20.0 - 300.0 mg/dL Final   Creatinine, Ser  Date Value Ref Range Status  08/01/2024 0.84 0.76 - 1.27 mg/dL Final         Passed - HGB in normal range and within 360 days    Hemoglobin  Date Value Ref Range Status  06/29/2024 13.9 13.0 - 17.0 g/dL Final  96/74/7974 86.7 13.0 - 17.7 g/dL Final         Passed - HCT in normal range and within 360 days    HCT  Date Value Ref Range Status  06/29/2024 41.1 39.0 - 52.0 % Final   Hematocrit  Date Value Ref Range Status  03/19/2024 41.5 37.5 - 51.0 % Final         Passed - PLT in normal range and within 360 days    Platelets  Date Value Ref Range Status  06/29/2024 324 150 - 400 K/uL Final  03/19/2024 312 150 - 450 x10E3/uL Final         Passed - WBC in normal range and within 360 days    WBC  Date Value Ref Range Status  06/29/2024 9.2 4.0 - 10.5 K/uL Final          Passed - eGFR is 30 or above and within 360 days    GFR calc Af Amer  Date Value Ref Range Status  10/01/2020 106 >59 mL/min/1.73 Final    Comment:    **[facility] currently reports eGFR in compliance with the current**   recommendations of the [employer]. [facility] will   update reporting as new guidelines are published from the NKF-ASN   Task force.    GFR, Estimated  Date Value Ref Range Status  06/29/2024 >60 >60 mL/min Final    Comment:    (NOTE) Calculated using the CKD-EPI Creatinine Equation (2021)    eGFR  Date Value Ref Range Status  08/01/2024 102 >59 mL/min/1.73 Final         Passed - Valid encounter within last 12 months    Recent Outpatient Visits  2 months ago Diabetes mellitus treated with injections of non-insulin  medication [facility])   [place] [facility] Melvin Pao, NP   5 months ago Diabetes mellitus treated with injections of non-insulin  medication [facility])   [place] [facility] Melvin Pao, NP   7 months ago OSA on CPAP   [place] [facility] Melvin Pao, NP   8 months ago Anxiety   [place] [facility] Melvin Pao, NP       Future Appointments             In 1 week Hester Alm BROCKS, MD [facility]

## 2024-10-28 ENCOUNTER — Telehealth: Payer: Self-pay

## 2024-10-28 ENCOUNTER — Other Ambulatory Visit: Payer: Self-pay | Admitting: Nurse Practitioner

## 2024-10-28 DIAGNOSIS — F419 Anxiety disorder, unspecified: Secondary | ICD-10-CM

## 2024-10-28 MED ORDER — CLONAZEPAM 1 MG PO TABS: 1.0000 mg | ORAL_TABLET | Freq: Every day | ORAL | 0 refills | Status: DC | PRN

## 2024-10-28 NOTE — Telephone Encounter (Signed)
 Copied from CRM #[id number]. Topic: Clinical - Medication Refill >> Oct 28, 2024  8:15 AM Tobias L wrote: Medication: clonazePAM  (KLONOPIN ) 1 MG tablet Patient states he only has one tablet left. Requesting for refill today.    Has the patient contacted their pharmacy? No Decided to call as he only has one tablet left.    This is the patient's preferred pharmacy:  [facility] - [place], [place] - [address] [place] [address] Phone: [phone] Fax: [phone]     Is this the correct pharmacy for this prescription? Yes   Has the prescription been filled recently? No   Is the patient out of the medication? Yes   Has the patient been seen for an appointment in the last year OR does the patient have an upcoming appointment? Yes   Can we respond through MyChart? No   Agent: Please be advised that Rx refills may take up to 3 business days. We ask that you follow-up with your pharmacy.

## 2024-10-28 NOTE — Progress Notes (Signed)
   10/28/2024  [name], male   DOB: 07-Sep-1966, 58 y.o.   MRN: [id number]  Missed call/voicemail from patient requesting a refill on clonazepam  1mg .  Prescription was sent to [facility] 08/01/2024 with directions to take 1 tablet daily as needed with #30 and 2 refills.  Contacted the pharmacy, and this prescription does not have refills remaining and was last filled 10/4.  Order pending for a quantity of 30 with no additional refills since patient sees PCP again 11/11.  He was last seen 8/7.  [name], PharmD, DPLA

## 2024-10-29 NOTE — Telephone Encounter (Signed)
 Requested medications are due for refill today.  no  Requested medications are on the active medications list.  yes  Last refill. yesterday  Future visit scheduled.   yes  Notes to clinic.  Refusal not delegated.    Requested Prescriptions  Pending Prescriptions Disp Refills   clonazePAM  (KLONOPIN ) 1 MG tablet 30 tablet 0    Sig: Take 1 tablet (1 mg total) by mouth daily as needed for anxiety. TAKE ONE TABLET DAILY AS NEEDED.     Not Delegated - Psychiatry: Anxiolytics/Hypnotics 2 Failed - 10/29/2024  3:28 PM      Failed - This refill cannot be delegated      Failed - Urine Drug Screen completed in last 360 days      Passed - Patient is not pregnant      Passed - Valid encounter within last 6 months    Recent Outpatient Visits           2 months ago Diabetes mellitus treated with injections of non-insulin  medication [facility])   [place] [facility] Melvin Pao, NP   6 months ago Diabetes mellitus treated with injections of non-insulin  medication [facility])   [place] [facility] Melvin Pao, NP   7 months ago OSA on CPAP   [place] [facility] Melvin Pao, NP   9 months ago Anxiety   [place] [facility] Melvin Pao, NP       Future Appointments             In 6 days Hester Alm BROCKS, MD [facility]

## 2024-10-29 NOTE — Telephone Encounter (Signed)
 Requested medication (s) are due for refill today - no  Requested medication (s) are on the active medication list -yes  Future visit scheduled -no  Last refill: 10/28/24 #30  Notes to clinic: non delegated Rx, duplicate request  Requested Prescriptions  Pending Prescriptions Disp Refills   clonazePAM  (KLONOPIN ) 1 MG tablet [Pharmacy Med Name: CLONAZEPAM  1MG  TABLET] 30 tablet 2    Sig: TAKE ONE TABLET (1 MG TOTAL) BY MOUTH DAILY AS NEEDED FOR ANXIETY.     Not Delegated - Psychiatry: Anxiolytics/Hypnotics 2 Failed - 10/29/2024  3:40 PM      Failed - This refill cannot be delegated      Failed - Urine Drug Screen completed in last 360 days      Passed - Patient is not pregnant      Passed - Valid encounter within last 6 months    Recent Outpatient Visits           2 months ago Diabetes mellitus treated with injections of non-insulin  medication (HCC)   [place] [facility] Melvin Pao, NP   6 months ago Diabetes mellitus treated with injections of non-insulin  medication [facility])   [place] [facility] Melvin Pao, NP   7 months ago OSA on CPAP   [place] [facility] Melvin Pao, NP   9 months ago Anxiety   [place] [facility] Melvin Pao, NP       Future Appointments             In 6 days Hester Alm BROCKS, MD [place] [facility]               Requested Prescriptions  Pending Prescriptions Disp Refills   clonazePAM  (KLONOPIN ) 1 MG tablet [Pharmacy Med Name: CLONAZEPAM  1MG  TABLET] 30 tablet 2    Sig: TAKE ONE TABLET (1 MG TOTAL) BY MOUTH DAILY AS NEEDED FOR ANXIETY.     Not Delegated - Psychiatry: Anxiolytics/Hypnotics 2 Failed - 10/29/2024  3:40 PM      Failed - This refill cannot be delegated      Failed - Urine Drug Screen completed in last 360 days      Passed - Patient is not pregnant      Passed - Valid encounter within last 6 months    Recent Outpatient  Visits           2 months ago Diabetes mellitus treated with injections of non-insulin  medication (HCC)   [place] [facility] Melvin Pao, NP   6 months ago Diabetes mellitus treated with injections of non-insulin  medication [facility])   [place] [facility] Melvin Pao, NP   7 months ago OSA on CPAP   [place] [facility] Melvin Pao, NP   9 months ago Anxiety   [place] [facility] Melvin Pao, NP       Future Appointments             In 6 days Hester Alm BROCKS, MD [facility]

## 2024-11-04 ENCOUNTER — Ambulatory Visit: Admitting: Nurse Practitioner

## 2024-11-04 ENCOUNTER — Ambulatory Visit: Payer: Self-pay | Admitting: Dermatology

## 2024-11-05 ENCOUNTER — Encounter: Payer: Self-pay | Admitting: Nurse Practitioner

## 2024-11-05 ENCOUNTER — Ambulatory Visit (INDEPENDENT_AMBULATORY_CARE_PROVIDER_SITE_OTHER): Payer: Self-pay | Admitting: Nurse Practitioner

## 2024-11-05 VITALS — BP 134/82 | HR 96 | Temp 97.4°F | Ht 69.0 in | Wt 273.4 lb

## 2024-11-05 DIAGNOSIS — F419 Anxiety disorder, unspecified: Secondary | ICD-10-CM

## 2024-11-05 DIAGNOSIS — E119 Type 2 diabetes mellitus without complications: Secondary | ICD-10-CM

## 2024-11-05 DIAGNOSIS — Z7985 Long-term (current) use of injectable non-insulin antidiabetic drugs: Secondary | ICD-10-CM

## 2024-11-05 DIAGNOSIS — E782 Mixed hyperlipidemia: Secondary | ICD-10-CM

## 2024-11-05 MED ORDER — CLONAZEPAM 1 MG PO TABS: 1.0000 mg | ORAL_TABLET | Freq: Two times a day (BID) | ORAL | 2 refills | Status: DC | PRN

## 2024-11-05 MED ORDER — FENOFIBRATE 160 MG PO TABS: 160.0000 mg | ORAL_TABLET | Freq: Every day | ORAL | 2 refills | Status: DC

## 2024-11-05 NOTE — Progress Notes (Unsigned)
 BP 134/82   Pulse 96   Temp (!) 97.4 F (36.3 C) (Oral)   Ht 5' 9 (1.753 m)   Wt 273 lb 6.4 oz (124 kg)   SpO2 97%   BMI 40.37 kg/m    Subjective:    [name], male    DOB: 1966-11-22, 58 y.o.   MRN: [id number]  HPI: Johnny Rios is a 58 y.o. male  Chief Complaint  Patient presents with   Anxiety   Diabetes    No recent eye exam per patient   Hypertension   ANXIETY Doing well with Klonopin .  Takes it daily.  Denies concerns at visit today.  Aware of the risks of taking it daily.  He has had increase anxiety recently.    He lost his insurance and has had to pay for medication out of pocket.  He plans to change plans but hasn't been able to do that yet.   DIABETES Does not currently have insurance.  He has not been taking Metformin  due to risk of complications with the medication.  He took a sample of Ozempic  and thought it worked well.   Hypoglycemic episodes:no Polydipsia/polyuria: no Visual disturbance: no Chest pain: no Paresthesias: no Glucose Monitoring: no  Accucheck frequency: not checking  Fasting glucose:  Post prandial:  Evening:  Before meals: Taking Insulin ?: no  Long acting insulin :  Short acting insulin :      Relevant past medical, surgical, family and social history reviewed and updated as indicated. Interim medical history since our last visit reviewed. Allergies and medications reviewed and updated.  Review of Systems  Eyes:  Negative for visual disturbance.  Respiratory:  Negative for chest tightness and shortness of breath.   Cardiovascular:  Negative for chest pain, palpitations and leg swelling.  Endocrine: Negative for polydipsia and polyuria.  Neurological:  Negative for dizziness, light-headedness, numbness and headaches.  Psychiatric/Behavioral:  The patient is nervous/anxious.     Per HPI unless specifically indicated above     Objective:    BP 134/82   Pulse 96   Temp (!) 97.4 F (36.3 C) (Oral)    Ht 5' 9 (1.753 m)   Wt 273 lb 6.4 oz (124 kg)   SpO2 97%   BMI 40.37 kg/m   Wt Readings from Last 3 Encounters:  11/05/24 273 lb 6.4 oz (124 kg)  08/01/24 274 lb 3.2 oz (124.4 kg)  06/29/24 274 lb (124.3 kg)    Physical Exam Vitals and nursing note reviewed.  Constitutional:      General: He is not in acute distress.    Appearance: Normal appearance. He is not ill-appearing, toxic-appearing or diaphoretic.  HENT:     Head: Normocephalic.     Right Ear: External ear normal.     Left Ear: External ear normal.     Nose: Nose normal. No congestion or rhinorrhea.     Mouth/Throat:     Mouth: Mucous membranes are moist.  Eyes:     General:        Right eye: No discharge.        Left eye: No discharge.     Extraocular Movements: Extraocular movements intact.     Conjunctiva/sclera: Conjunctivae normal.     Pupils: Pupils are equal, round, and reactive to light.  Cardiovascular:     Rate and Rhythm: Normal rate and regular rhythm.     Heart sounds: No murmur heard. Pulmonary:     Effort: Pulmonary effort is  normal. No respiratory distress.     Breath sounds: Normal breath sounds. No wheezing, rhonchi or rales.  Abdominal:     General: Abdomen is flat. Bowel sounds are normal.  Musculoskeletal:     Cervical back: Normal range of motion and neck supple.  Skin:    General: Skin is warm and dry.     Capillary Refill: Capillary refill takes less than 2 seconds.  Neurological:     General: No focal deficit present.     Mental Status: He is alert and oriented to person, place, and time.  Psychiatric:        Mood and Affect: Mood normal.        Behavior: Behavior normal.        Thought Content: Thought content normal.        Judgment: Judgment normal.     Results for orders placed or performed in visit on 08/01/24  Microalbumin, Urine Waived   Collection Time: 08/01/24  3:21 PM  Result Value Ref Range   Microalb, Ur Waived 30 (H) 0 - 19 mg/L   Creatinine, Urine Waived 200  10 - 300 mg/dL   Microalb/Creat Ratio <30 <30 mg/g  Comp Met (CMET)   Collection Time: 08/01/24  3:22 PM  Result Value Ref Range   Glucose 189 (H) 70 - 99 mg/dL   BUN 12 6 - 24 mg/dL   Creatinine, Ser 9.15 0.76 - 1.27 mg/dL   eGFR 897 >40 fO/fpw/8.26   BUN/Creatinine Ratio 14 9 - 20   Sodium 138 134 - 144 mmol/L   Potassium 4.4 3.5 - 5.2 mmol/L   Chloride 100 96 - 106 mmol/L   CO2 22 20 - 29 mmol/L   Calcium  9.7 8.7 - 10.2 mg/dL   Total Protein 6.8 6.0 - 8.5 g/dL   Albumin 4.4 3.8 - 4.9 g/dL   Globulin, Total 2.4 1.5 - 4.5 g/dL   Bilirubin Total 0.4 0.0 - 1.2 mg/dL   Alkaline Phosphatase 109 44 - 121 IU/L   AST 22 0 - 40 IU/L   ALT 33 0 - 44 IU/L  HgB A1c   Collection Time: 08/01/24  3:22 PM  Result Value Ref Range   Hgb A1c MFr Bld 8.8 (H) 4.8 - 5.6 %   Est. average glucose Bld gHb Est-mCnc 206 mg/dL  Vitamin D  (25 hydroxy)   Collection Time: 08/01/24  3:22 PM  Result Value Ref Range   Vit D, 25-Hydroxy 23.1 (L) 30.0 - 100.0 ng/mL      Assessment & Plan:   Problem List Items Addressed This Visit       Endocrine   Diabetes mellitus treated with injections of non-insulin  medication (HCC) - Primary   Chronic. Not well controlled.  Discussed risks of uncontrolled diabetes during visit today.  Discussed benefits of taking Metformin  and it being cost effective.  Will work with pharmacist to see if we can get Ozempic  through PAP for patient.  Follow up in 3 months. Will recheck labs at that time.        Other   Anxiety   Chronic.  Controlled.  Continue with current medication regimen of Clonazepam  daily.  Controlled substance agreement and UDS up to date.  Understands risks of taking Benzodiazepine and okay with continuing. Labs ordered today.  Return to clinic in 3 months for reevaluation.  Call sooner if concerns arise.  Pharmacy was changed due to patient now paying out of pocket.  He was given 35 pills due to  there before some months where there are more than 30 days.       Relevant Medications   clonazePAM  (KLONOPIN ) 1 MG tablet   Hyperlipidemia   Relevant Medications   fenofibrate  160 MG tablet       Follow up plan: Return in about 15 weeks (around 02/18/2025).

## 2024-11-06 ENCOUNTER — Telehealth: Payer: Self-pay | Admitting: Nurse Practitioner

## 2024-11-06 NOTE — Assessment & Plan Note (Signed)
 Chronic.  Controlled.  Continue with current medication regimen of Clonazepam  daily.  Controlled substance agreement and UDS up to date.  Understands risks of taking Benzodiazepine and okay with continuing. Labs ordered today.  Return to clinic in 3 months for reevaluation.  Call sooner if concerns arise.  Pharmacy was changed due to patient now paying out of pocket.  He was given 35 pills due to there before some months where there are more than 30 days.

## 2024-11-06 NOTE — Assessment & Plan Note (Signed)
 Chronic. Not well controlled.  Discussed risks of uncontrolled diabetes during visit today.  Discussed benefits of taking Metformin  and it being cost effective.  Will work with pharmacist to see if we can get Ozempic  through PAP for patient.  Follow up in 3 months. Will recheck labs at that time.

## 2024-11-07 ENCOUNTER — Ambulatory Visit: Payer: Managed Care, Other (non HMO) | Admitting: Dermatology

## 2024-11-08 ENCOUNTER — Telehealth: Payer: Self-pay

## 2024-11-08 NOTE — Telephone Encounter (Signed)
 Left a HIPAA VM tried doing pap online [employer] Ozempic , but said pt has ins and will not qualify for PAP, I will mail out PAP to pt home for pt to sign and date and we can submitted this way sometimes they do approved.

## 2024-11-23 ENCOUNTER — Encounter: Payer: Self-pay | Admitting: Nurse Practitioner

## 2024-11-23 DIAGNOSIS — F419 Anxiety disorder, unspecified: Secondary | ICD-10-CM

## 2024-11-25 ENCOUNTER — Other Ambulatory Visit: Payer: Self-pay | Admitting: Nurse Practitioner

## 2024-11-25 DIAGNOSIS — F419 Anxiety disorder, unspecified: Secondary | ICD-10-CM

## 2024-11-25 MED ORDER — OMEPRAZOLE 20 MG PO CPDR: 20.0000 mg | DELAYED_RELEASE_CAPSULE | Freq: Every day | ORAL | 1 refills | Status: AC

## 2024-11-25 MED ORDER — ROSUVASTATIN CALCIUM 5 MG PO TABS: 5.0000 mg | ORAL_TABLET | Freq: Every day | ORAL | 1 refills | Status: AC

## 2024-11-25 MED ORDER — CLONAZEPAM 1 MG PO TABS: 1.0000 mg | ORAL_TABLET | Freq: Two times a day (BID) | ORAL | 2 refills | Status: DC | PRN

## 2024-11-25 NOTE — Telephone Encounter (Signed)
 Prescription Request  11/25/2024  LOV: 11/05/2024  What is the name of the medication or equipment? clonazePAM  (KLONOPIN ) 1 MG (patient states he is out of this one)  rosuvastatin  (CRESTOR ) 5 MG   omeprazole  (PRILOSEC) 20 MG   Have you contacted your pharmacy to request a refill? Yes   Which pharmacy would you like this sent to?   [id number] [address], [place] - [address] AT [facility] OF [address] [place] [address] Phone: [phone] Fax: [phone]  Patient notified that their request is being sent to the clinical staff for review and that they should receive a response within 2 business days.   Please advise at Mobile [phone] (mobile)

## 2024-11-26 ENCOUNTER — Telehealth: Payer: Self-pay | Admitting: Nurse Practitioner

## 2024-11-26 NOTE — Telephone Encounter (Signed)
 Copied from CRM [id number]. Topic: General - Billing Inquiry >> Nov 26, 2024 11:38 AM Johnny Rios wrote: Reason for CRM: Patient is calling because he noticed a charge on his account from  [employer] for $39 and was not aware of what it was for. Patient stated he spoke to [facility] the pharmacist this morning who asked the patient to contact the office.

## 2025-01-02 ENCOUNTER — Ambulatory Visit: Payer: Self-pay | Admitting: Dermatology

## 2025-01-03 NOTE — Telephone Encounter (Signed)
 error

## 2025-01-07 ENCOUNTER — Ambulatory Visit: Payer: Self-pay

## 2025-01-07 NOTE — Telephone Encounter (Signed)
 FYI Only or Action Required?: FYI only for provider: appointment scheduled on 01/08/25 with ED precautions provided.  Patient was last seen in primary care on 11/05/2024 by Melvin Pao, NP.  Called Nurse Triage reporting Testicle Pain.  Symptoms began a week ago.  Interventions attempted: Rest, hydration, or home remedies and Ice/heat application.  Symptoms are: unchanged.  Triage Disposition: See Physician Within 24 Hours  Patient/caregiver understands and will follow disposition?: Yes Reason for Disposition  [1] Pain comes and goes (intermittent) AND [2] present > 24 hours  Answer Assessment - Initial Assessment Questions Pt reports that he sat on his right testicle one week ago and has been experiencing right sided pain since. Denies swelling. Pain is rated as varied, worse with movement or with touch. Denies bruising, color change, dysuria, hematuria, abdominal pain, nausea or vomiting. Specifically recalls that pain began after sitting on it.   1. LOCATION and RADIATION: Where is the pain located?      Right testicle 2. QUALITY: What does the pain feel like?  (e.g., sharp, dull, aching, burning)     Ache 3. SEVERITY: How bad is the pain?  (Scale 1-10; or mild, moderate, severe)   - MILD (1-3): doesn't interfere with normal activities    - MODERATE (4-7): interferes with normal activities (e.g., work or school) or awakens from sleep   - SEVERE (8-10): excruciating pain, unable to do any normal activities, difficulty walking     Varies 4. ONSET: When did the pain start?     One week 5. PATTERN: Does it come and go, or has it been constant since it started?     Comes and goes based on activity 6. SCROTAL APPEARANCE: What does the scrotum look like? Is there any swelling or redness?      Denies change in appearance 7. HERNIA: Has a doctor ever told you that you have a hernia?     Has an abdominal hernia and 2 that had surgery for 8. OTHER SYMPTOMS: Do you  have any other symptoms? (e.g., fever, abdominal pain, vomiting, difficulty passing urine)     Denies  Protocols used: Scrotal Pain-A-AH Copied from CRM #[id number]. Topic: Clinical - Red Word Triage >> Jan 07, 2025  3:12 PM Delon DASEN wrote: Red Word that prompted transfer to Nurse Triage: groin pain level 8 for one week after sitting on scrotum

## 2025-01-08 ENCOUNTER — Ambulatory Visit: Payer: Self-pay | Admitting: Nurse Practitioner

## 2025-01-08 ENCOUNTER — Encounter: Payer: Self-pay | Admitting: Nurse Practitioner

## 2025-01-08 VITALS — BP 126/87 | HR 106 | Temp 97.5°F | Ht 69.02 in | Wt 269.8 lb

## 2025-01-08 DIAGNOSIS — F419 Anxiety disorder, unspecified: Secondary | ICD-10-CM

## 2025-01-08 DIAGNOSIS — E782 Mixed hyperlipidemia: Secondary | ICD-10-CM

## 2025-01-08 DIAGNOSIS — N50819 Testicular pain, unspecified: Secondary | ICD-10-CM

## 2025-01-08 DIAGNOSIS — E611 Iron deficiency: Secondary | ICD-10-CM

## 2025-01-08 DIAGNOSIS — E118 Type 2 diabetes mellitus with unspecified complications: Secondary | ICD-10-CM

## 2025-01-08 LAB — URINALYSIS, ROUTINE W REFLEX MICROSCOPIC
Bilirubin, UA: NEGATIVE
Ketones, UA: NEGATIVE
Leukocytes,UA: NEGATIVE
Nitrite, UA: NEGATIVE
Protein,UA: NEGATIVE
RBC, UA: NEGATIVE
Specific Gravity, UA: 1.025 (ref 1.005–1.030)
Urobilinogen, Ur: 0.2 mg/dL (ref 0.2–1.0)
pH, UA: 5.5 (ref 5.0–7.5)

## 2025-01-08 MED ORDER — FENOFIBRATE 160 MG PO TABS: 160.0000 mg | ORAL_TABLET | Freq: Every day | ORAL | 2 refills | Status: AC

## 2025-01-08 MED ORDER — IRON (FERROUS SULFATE) 325 (65 FE) MG PO TABS: 325.0000 mg | ORAL_TABLET | Freq: Every day | ORAL | 1 refills | Status: AC

## 2025-01-08 MED ORDER — CLONAZEPAM 1 MG PO TABS: 1.0000 mg | ORAL_TABLET | Freq: Two times a day (BID) | ORAL | 2 refills | Status: AC | PRN

## 2025-01-08 MED ORDER — LISINOPRIL 10 MG PO TABS: 10.0000 mg | ORAL_TABLET | Freq: Every day | ORAL | 1 refills | Status: AC

## 2025-01-08 NOTE — Assessment & Plan Note (Signed)
 Chronic. Last A1c >8%.  Patient does not want to take any medications.  Lengthy discussion had regarding diet, carbohydrates and soda intake.  Labs ordered today.  Follow up in 3 months.  Call sooner if concerns arise.

## 2025-01-08 NOTE — Progress Notes (Signed)
 "  BP 126/87 (BP Location: Left Arm, Cuff Size: Large)   Pulse (!) 106   Temp (!) 97.5 F (36.4 C) (Oral)   Ht 5' 9.02 (1.753 m)   Wt 269 lb 12.8 oz (122.4 kg)   SpO2 95%   BMI 39.82 kg/m    Subjective:    [name], male    DOB: 1966/02/21, 58 y.o.   MRN: [id number]  HPI: Johnny Rios is a 59 y.o. male  Chief Complaint  Patient presents with   Acute Visit    Testicle pain. Patient stated he sat on his testicles and that's how it started hurting.   Patient states sat on his testicles and it felt like they were pinched.  Symptoms have resolved today.  Not having any pain with touching, urination, fevers, back pain, swelling or abdominal pain.    ANXIETY Doing well with Klonopin .  Takes it daily.  Denies concerns at visit today.  Aware of the risks of taking it daily.  He has had increase anxiety recently.    He lost his insurance and has had to pay for medication out of pocket.  He plans to change plans but hasn't been able to do that yet.   DIABETES Does not currently have insurance.  He has not been taking any medications.  He has cut back on his soda intake and changed to zero sugar sodas.  Hypoglycemic episodes:no Polydipsia/polyuria: no Visual disturbance: no Chest pain: no Paresthesias: no Glucose Monitoring: no  Accucheck frequency: not checking  Fasting glucose:  Post prandial:  Evening:  Before meals: Taking Insulin ?: no  Long acting insulin :  Short acting insulin :  HYPERTENSION without Chronic Kidney Disease Hypertension status: controlled  Satisfied with current treatment? yes Duration of hypertension: years BP monitoring frequency:  not checking BP range:  BP medication side effects:  no Medication compliance: good compliance Previous BP meds:lisinopril  Aspirin : no Recurrent headaches: no Visual changes: no Palpitations: no Dyspnea: no Chest pain: no Lower extremity edema: no Dizzy/lightheaded: no   Relevant past  medical, surgical, family and social history reviewed and updated as indicated. Interim medical history since our last visit reviewed. Allergies and medications reviewed and updated.  Review of Systems  Eyes:  Negative for visual disturbance.  Respiratory:  Negative for chest tightness and shortness of breath.   Cardiovascular:  Negative for chest pain, palpitations and leg swelling.  Endocrine: Negative for polydipsia and polyuria.  Genitourinary:  Positive for testicular pain.  Neurological:  Negative for dizziness, light-headedness, numbness and headaches.  Psychiatric/Behavioral:  The patient is nervous/anxious.     Per HPI unless specifically indicated above     Objective:    BP 126/87 (BP Location: Left Arm, Cuff Size: Large)   Pulse (!) 106   Temp (!) 97.5 F (36.4 C) (Oral)   Ht 5' 9.02 (1.753 m)   Wt 269 lb 12.8 oz (122.4 kg)   SpO2 95%   BMI 39.82 kg/m   Wt Readings from Last 3 Encounters:  01/08/25 269 lb 12.8 oz (122.4 kg)  11/05/24 273 lb 6.4 oz (124 kg)  08/01/24 274 lb 3.2 oz (124.4 kg)    Physical Exam Vitals and nursing note reviewed.  Constitutional:      General: He is not in acute distress.    Appearance: Normal appearance. He is not ill-appearing, toxic-appearing or diaphoretic.  HENT:     Head: Normocephalic.     Right Ear: External ear normal.  Left Ear: External ear normal.     Nose: Nose normal. No congestion or rhinorrhea.     Mouth/Throat:     Mouth: Mucous membranes are moist.  Eyes:     General:        Right eye: No discharge.        Left eye: No discharge.     Extraocular Movements: Extraocular movements intact.     Conjunctiva/sclera: Conjunctivae normal.     Pupils: Pupils are equal, round, and reactive to light.  Cardiovascular:     Rate and Rhythm: Normal rate and regular rhythm.     Heart sounds: No murmur heard. Pulmonary:     Effort: Pulmonary effort is normal. No respiratory distress.     Breath sounds: Normal breath  sounds. No wheezing, rhonchi or rales.  Abdominal:     General: Abdomen is flat. Bowel sounds are normal.  Musculoskeletal:     Cervical back: Normal range of motion and neck supple.  Skin:    General: Skin is warm and dry.     Capillary Refill: Capillary refill takes less than 2 seconds.  Neurological:     General: No focal deficit present.     Mental Status: He is alert and oriented to person, place, and time.  Psychiatric:        Mood and Affect: Mood normal.        Behavior: Behavior normal.        Thought Content: Thought content normal.        Judgment: Judgment normal.     Results for orders placed or performed in visit on 08/01/24  Microalbumin, Urine Waived   Collection Time: 08/01/24  3:21 PM  Result Value Ref Range   Microalb, Ur Waived 30 (H) 0 - 19 mg/L   Creatinine, Urine Waived 200 10 - 300 mg/dL   Microalb/Creat Ratio <30 <30 mg/g  Comp Met (CMET)   Collection Time: 08/01/24  3:22 PM  Result Value Ref Range   Glucose 189 (H) 70 - 99 mg/dL   BUN 12 6 - 24 mg/dL   Creatinine, Ser 9.15 0.76 - 1.27 mg/dL   eGFR 897 >40 fO/fpw/8.26   BUN/Creatinine Ratio 14 9 - 20   Sodium 138 134 - 144 mmol/L   Potassium 4.4 3.5 - 5.2 mmol/L   Chloride 100 96 - 106 mmol/L   CO2 22 20 - 29 mmol/L   Calcium  9.7 8.7 - 10.2 mg/dL   Total Protein 6.8 6.0 - 8.5 g/dL   Albumin 4.4 3.8 - 4.9 g/dL   Globulin, Total 2.4 1.5 - 4.5 g/dL   Bilirubin Total 0.4 0.0 - 1.2 mg/dL   Alkaline Phosphatase 109 44 - 121 IU/L   AST 22 0 - 40 IU/L   ALT 33 0 - 44 IU/L  HgB A1c   Collection Time: 08/01/24  3:22 PM  Result Value Ref Range   Hgb A1c MFr Bld 8.8 (H) 4.8 - 5.6 %   Est. average glucose Bld gHb Est-mCnc 206 mg/dL  Vitamin D  (25 hydroxy)   Collection Time: 08/01/24  3:22 PM  Result Value Ref Range   Vit D, 25-Hydroxy 23.1 (L) 30.0 - 100.0 ng/mL      Assessment & Plan:   Problem List Items Addressed This Visit       Endocrine   Controlled diabetes mellitus type 2 with  complications (HCC) - Primary   Chronic. Last A1c >8%.  Patient does not want to take any medications.  Lengthy discussion had regarding diet, carbohydrates and soda intake.  Labs ordered today.  Follow up in 3 months.  Call sooner if concerns arise.       Relevant Medications   lisinopril  (ZESTRIL ) 10 MG tablet   Other Relevant Orders   HgB A1c     Other   Anxiety   Chronic.  Controlled.  Continue with current medication regimen of Clonazepam  daily.  Controlled substance agreement and UDS up to date.  Understands risks of taking Benzodiazepine and okay with continuing. Labs ordered today.  Return to clinic in 3 months for reevaluation.  Call sooner if concerns arise.  Pharmacy was changed due to patient now paying out of pocket.  He was given 35 pills due to there before some months where there are more than 30 days.      Relevant Medications   clonazePAM  (KLONOPIN ) 1 MG tablet   Hyperlipidemia   Relevant Medications   fenofibrate  160 MG tablet   lisinopril  (ZESTRIL ) 10 MG tablet   Other Visit Diagnoses       Pain in testicle, unspecified laterality       Exam deferred due to resolution of symptoms. Will obtain lab work at visit today to rule out infectious causes. Suspect pain was due to injury that has resolved   Relevant Orders   Comprehensive metabolic panel with GFR   CBC With Diff/Platelet   Urinalysis, Routine w reflex microscopic   Urine Culture   PSA     Iron  deficiency       Has not been taking his iron .  Needs new prescription sent to the phramacy.   Relevant Medications   Iron , Ferrous Sulfate , 325 (65 Fe) MG TABS        Follow up plan: Return in about 3 months (around 04/08/2025) for HTN, HLD, DM2 FU.      "

## 2025-01-08 NOTE — Assessment & Plan Note (Signed)
 Chronic.  Controlled.  Continue with current medication regimen of Clonazepam  daily.  Controlled substance agreement and UDS up to date.  Understands risks of taking Benzodiazepine and okay with continuing. Labs ordered today.  Return to clinic in 3 months for reevaluation.  Call sooner if concerns arise.  Pharmacy was changed due to patient now paying out of pocket.  He was given 35 pills due to there before some months where there are more than 30 days.

## 2025-01-09 ENCOUNTER — Ambulatory Visit: Payer: Self-pay | Admitting: Nurse Practitioner

## 2025-01-09 LAB — COMPREHENSIVE METABOLIC PANEL WITH GFR
ALT: 32 IU/L (ref 0–44)
AST: 19 IU/L (ref 0–40)
Albumin: 4.1 g/dL (ref 3.8–4.9)
Alkaline Phosphatase: 144 IU/L — ABNORMAL HIGH (ref 47–123)
BUN/Creatinine Ratio: 12 (ref 9–20)
BUN: 9 mg/dL (ref 6–24)
Bilirubin Total: 0.5 mg/dL (ref 0.0–1.2)
CO2: 21 mmol/L (ref 20–29)
Calcium: 9.6 mg/dL (ref 8.7–10.2)
Chloride: 100 mmol/L (ref 96–106)
Creatinine, Ser: 0.76 mg/dL (ref 0.76–1.27)
Globulin, Total: 2.5 g/dL (ref 1.5–4.5)
Glucose: 233 mg/dL — ABNORMAL HIGH (ref 70–99)
Potassium: 4.3 mmol/L (ref 3.5–5.2)
Sodium: 135 mmol/L (ref 134–144)
Total Protein: 6.6 g/dL (ref 6.0–8.5)
eGFR: 105 mL/min/1.73

## 2025-01-09 LAB — CBC WITH DIFF/PLATELET
Basophils Absolute: 0.1 x10E3/uL (ref 0.0–0.2)
Basos: 1 %
EOS (ABSOLUTE): 0.2 x10E3/uL (ref 0.0–0.4)
Eos: 3 %
Hematocrit: 44.5 % (ref 37.5–51.0)
Hemoglobin: 14.4 g/dL (ref 13.0–17.7)
Immature Grans (Abs): 0 x10E3/uL (ref 0.0–0.1)
Immature Granulocytes: 0 %
Lymphocytes Absolute: 2.5 x10E3/uL (ref 0.7–3.1)
Lymphs: 32 %
MCH: 26.8 pg (ref 26.6–33.0)
MCHC: 32.4 g/dL (ref 31.5–35.7)
MCV: 83 fL (ref 79–97)
Monocytes Absolute: 0.5 x10E3/uL (ref 0.1–0.9)
Monocytes: 7 %
Neutrophils Absolute: 4.6 x10E3/uL (ref 1.4–7.0)
Neutrophils: 57 %
Platelets: 295 x10E3/uL (ref 150–450)
RBC: 5.37 x10E6/uL (ref 4.14–5.80)
RDW: 12.5 % (ref 11.6–15.4)
WBC: 7.9 x10E3/uL (ref 3.4–10.8)

## 2025-01-09 LAB — PSA: Prostate Specific Ag, Serum: 2.6 ng/mL (ref 0.0–4.0)

## 2025-01-09 LAB — HEMOGLOBIN A1C
Est. average glucose Bld gHb Est-mCnc: 249 mg/dL
Hgb A1c MFr Bld: 10.3 % — ABNORMAL HIGH (ref 4.8–5.6)

## 2025-01-10 LAB — URINE CULTURE: Organism ID, Bacteria: NO GROWTH

## 2025-01-10 NOTE — Telephone Encounter (Signed)
 2nd attempt made to reach patient and discuss results. Will try again on next business day. Ok for E2C2 to review results if call is returned.

## 2025-01-20 NOTE — Telephone Encounter (Signed)
 Per [employer] pt does not qualify for PAP due to pt having Ins. Left a HIPAA VM.

## 2025-02-18 ENCOUNTER — Ambulatory Visit: Payer: Self-pay | Admitting: Nurse Practitioner

## 2025-04-09 ENCOUNTER — Ambulatory Visit: Payer: Self-pay | Admitting: Nurse Practitioner
